# Patient Record
Sex: Male | Born: 1965 | Race: White | Hispanic: No | Smoking: Current every day smoker
Health system: Southern US, Community
[De-identification: ages and names within clinical notes are randomized; demographics above are authoritative.]

## PROBLEM LIST (undated history)

## (undated) DIAGNOSIS — T81321D Disruption or dehiscence of closure of internal operation (surgical) wound of abdominal wall muscle or fascia, subsequent encounter: Secondary | ICD-10-CM

## (undated) DIAGNOSIS — I9789 Other postprocedural complications and disorders of the circulatory system, not elsewhere classified: Secondary | ICD-10-CM

## (undated) DIAGNOSIS — E876 Hypokalemia: Secondary | ICD-10-CM

## (undated) DIAGNOSIS — E0590 Thyrotoxicosis, unspecified without thyrotoxic crisis or storm: Secondary | ICD-10-CM

## (undated) DIAGNOSIS — J9584 Transfusion-related acute lung injury (TRALI): Secondary | ICD-10-CM

## (undated) DIAGNOSIS — Z8719 Personal history of other diseases of the digestive system: Secondary | ICD-10-CM

## (undated) DIAGNOSIS — K4090 Unilateral inguinal hernia, without obstruction or gangrene, not specified as recurrent: Secondary | ICD-10-CM

## (undated) DIAGNOSIS — I700 Atherosclerosis of aorta: Secondary | ICD-10-CM

## (undated) DIAGNOSIS — Z87898 Personal history of other specified conditions: Secondary | ICD-10-CM

## (undated) DIAGNOSIS — M1990 Unspecified osteoarthritis, unspecified site: Secondary | ICD-10-CM

## (undated) DIAGNOSIS — K567 Ileus, unspecified: Secondary | ICD-10-CM

## (undated) DIAGNOSIS — R251 Tremor, unspecified: Secondary | ICD-10-CM

## (undated) DIAGNOSIS — F32A Depression, unspecified: Secondary | ICD-10-CM

## (undated) DIAGNOSIS — J189 Pneumonia, unspecified organism: Secondary | ICD-10-CM

## (undated) DIAGNOSIS — T8051XA Anaphylactic reaction due to administration of blood and blood products, initial encounter: Secondary | ICD-10-CM

## (undated) DIAGNOSIS — L0390 Cellulitis, unspecified: Secondary | ICD-10-CM

## (undated) DIAGNOSIS — R519 Headache, unspecified: Secondary | ICD-10-CM

## (undated) DIAGNOSIS — E871 Hypo-osmolality and hyponatremia: Secondary | ICD-10-CM

## (undated) DIAGNOSIS — B019 Varicella without complication: Secondary | ICD-10-CM

## (undated) DIAGNOSIS — Z862 Personal history of diseases of the blood and blood-forming organs and certain disorders involving the immune mechanism: Secondary | ICD-10-CM

## (undated) DIAGNOSIS — R51 Headache: Secondary | ICD-10-CM

## (undated) DIAGNOSIS — J40 Bronchitis, not specified as acute or chronic: Secondary | ICD-10-CM

## (undated) DIAGNOSIS — I82401 Acute embolism and thrombosis of unspecified deep veins of right lower extremity: Secondary | ICD-10-CM

## (undated) DIAGNOSIS — J45909 Unspecified asthma, uncomplicated: Secondary | ICD-10-CM

## (undated) DIAGNOSIS — K219 Gastro-esophageal reflux disease without esophagitis: Secondary | ICD-10-CM

## (undated) DIAGNOSIS — F329 Major depressive disorder, single episode, unspecified: Secondary | ICD-10-CM

## (undated) DIAGNOSIS — I10 Essential (primary) hypertension: Secondary | ICD-10-CM

## (undated) DIAGNOSIS — I509 Heart failure, unspecified: Secondary | ICD-10-CM

## (undated) DIAGNOSIS — E559 Vitamin D deficiency, unspecified: Secondary | ICD-10-CM

## (undated) DIAGNOSIS — K760 Fatty (change of) liver, not elsewhere classified: Secondary | ICD-10-CM

## (undated) DIAGNOSIS — R0602 Shortness of breath: Secondary | ICD-10-CM

## (undated) DIAGNOSIS — R600 Localized edema: Secondary | ICD-10-CM

## (undated) DIAGNOSIS — G4733 Obstructive sleep apnea (adult) (pediatric): Secondary | ICD-10-CM

## (undated) DIAGNOSIS — G43909 Migraine, unspecified, not intractable, without status migrainosus: Secondary | ICD-10-CM

## (undated) HISTORY — DX: Depression, unspecified: F32.A

## (undated) HISTORY — DX: Headache: R51

## (undated) HISTORY — DX: Unilateral inguinal hernia, without obstruction or gangrene, not specified as recurrent: K40.90

## (undated) HISTORY — DX: Unspecified asthma, uncomplicated: J45.909

## (undated) HISTORY — DX: Tremor, unspecified: R25.1

## (undated) HISTORY — DX: Hypo-osmolality and hyponatremia: E87.1

## (undated) HISTORY — DX: Acute embolism and thrombosis of unspecified deep veins of right lower extremity: I82.401

## (undated) HISTORY — DX: Vitamin D deficiency, unspecified: E55.9

## (undated) HISTORY — DX: Atherosclerosis of aorta: I70.0

## (undated) HISTORY — DX: Morbid (severe) obesity due to excess calories: E66.01

## (undated) HISTORY — DX: Ileus, unspecified: K56.7

## (undated) HISTORY — DX: Pneumonia, unspecified organism: J18.9

## (undated) HISTORY — DX: Thyrotoxicosis, unspecified without thyrotoxic crisis or storm: E05.90

## (undated) HISTORY — DX: Major depressive disorder, single episode, unspecified: F32.9

## (undated) HISTORY — DX: Fatty (change of) liver, not elsewhere classified: K76.0

## (undated) HISTORY — DX: Anaphylactic reaction due to administration of blood and blood products, initial encounter: T80.51XA

## (undated) HISTORY — DX: Varicella without complication: B01.9

## (undated) HISTORY — DX: Personal history of other diseases of the digestive system: Z87.19

## (undated) HISTORY — DX: Hypokalemia: E87.6

## (undated) HISTORY — DX: Heart failure, unspecified: I50.9

## (undated) HISTORY — DX: Transfusion-related acute lung injury (trali): J95.84

## (undated) HISTORY — DX: Other postprocedural complications and disorders of the circulatory system, not elsewhere classified: I97.89

## (undated) HISTORY — DX: Personal history of diseases of the blood and blood-forming organs and certain disorders involving the immune mechanism: Z86.2

## (undated) HISTORY — DX: Localized edema: R60.0

## (undated) HISTORY — DX: Obstructive sleep apnea (adult) (pediatric): G47.33

## (undated) HISTORY — DX: Essential (primary) hypertension: I10

## (undated) HISTORY — DX: Shortness of breath: R06.02

## (undated) HISTORY — DX: Personal history of other specified conditions: Z87.898

## (undated) HISTORY — DX: Unspecified osteoarthritis, unspecified site: M19.90

## (undated) HISTORY — DX: Cellulitis, unspecified: L03.90

## (undated) HISTORY — DX: Headache, unspecified: R51.9

## (undated) HISTORY — PX: HERNIA REPAIR: SHX51

## (undated) HISTORY — DX: Gastro-esophageal reflux disease without esophagitis: K21.9

## (undated) HISTORY — DX: Disruption or dehiscence of closure of internal operation (surgical) wound of abdominal wall muscle or fascia, subsequent encounter: T81.321D

## (undated) HISTORY — DX: Migraine, unspecified, not intractable, without status migrainosus: G43.909

---
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
## 1990-04-27 HISTORY — PX: KNEE ARTHROSCOPY: SUR90

## 1992-04-27 HISTORY — PX: OTHER SURGICAL HISTORY: SHX169

## 2011-04-28 HISTORY — PX: UMBILICAL HERNIA REPAIR: SHX2598

## 2012-04-30 ENCOUNTER — Inpatient Hospital Stay: Payer: Self-pay | Admitting: Surgery

## 2012-04-30 LAB — URINALYSIS, COMPLETE
Leukocyte Esterase: NEGATIVE
Nitrite: NEGATIVE
RBC,UR: 7 /HPF (ref 0–5)
Specific Gravity: 1.028 (ref 1.003–1.030)
WBC UR: 3 /HPF (ref 0–5)

## 2012-04-30 LAB — COMPREHENSIVE METABOLIC PANEL
Albumin: 3.5 g/dL (ref 3.4–5.0)
Anion Gap: 7 (ref 7–16)
BUN: 11 mg/dL (ref 7–18)
Creatinine: 0.97 mg/dL (ref 0.60–1.30)
Glucose: 99 mg/dL (ref 65–99)
Osmolality: 275 (ref 275–301)
Potassium: 3.9 mmol/L (ref 3.5–5.1)
SGPT (ALT): 35 U/L (ref 12–78)
Total Protein: 7.6 g/dL (ref 6.4–8.2)

## 2012-04-30 LAB — CBC
HCT: 51.3 % (ref 40.0–52.0)
HGB: 17.9 g/dL (ref 13.0–18.0)
MCH: 32.3 pg (ref 26.0–34.0)
MCHC: 34.8 g/dL (ref 32.0–36.0)
RBC: 5.53 10*6/uL (ref 4.40–5.90)
RDW: 13.1 % (ref 11.5–14.5)

## 2012-05-03 LAB — BASIC METABOLIC PANEL
Anion Gap: 5 — ABNORMAL LOW (ref 7–16)
Chloride: 104 mmol/L (ref 98–107)
Creatinine: 1.05 mg/dL (ref 0.60–1.30)
EGFR (Non-African Amer.): >60
Glucose: 110 mg/dL — ABNORMAL HIGH (ref 65–99)
Osmolality: 272 (ref 275–301)
Potassium: 4.6 mmol/L (ref 3.5–5.1)
Sodium: 137 mmol/L (ref 136–145)

## 2012-05-03 LAB — PATHOLOGY REPORT

## 2012-05-03 LAB — CBC WITH DIFFERENTIAL/PLATELET
Basophil %: 0.2 %
Eosinophil #: 0.1 10*3/uL (ref 0.0–0.7)
Eosinophil %: 1.4 %
Lymphocyte %: 17.4 %
MCHC: 33.4 g/dL (ref 32.0–36.0)
Monocyte %: 9.1 %
Neutrophil %: 71.9 %
RDW: 12.7 % (ref 11.5–14.5)
WBC: 9.7 10*3/uL (ref 3.8–10.6)

## 2013-12-20 ENCOUNTER — Emergency Department: Payer: Self-pay | Admitting: Emergency Medicine

## 2013-12-20 LAB — COMPREHENSIVE METABOLIC PANEL
ANION GAP: 7 (ref 7–16)
Albumin: 3.3 g/dL — ABNORMAL LOW (ref 3.4–5.0)
Alkaline Phosphatase: 73 U/L
BUN: 11 mg/dL (ref 7–18)
Bilirubin,Total: 0.7 mg/dL (ref 0.2–1.0)
CO2: 25 mmol/L (ref 21–32)
Calcium, Total: 8.7 mg/dL (ref 8.5–10.1)
Chloride: 108 mmol/L — ABNORMAL HIGH (ref 98–107)
Creatinine: 1.29 mg/dL (ref 0.60–1.30)
EGFR (Non-African Amer.): >60
GLUCOSE: 97 mg/dL (ref 65–99)
Osmolality: 279 (ref 275–301)
POTASSIUM: 4.2 mmol/L (ref 3.5–5.1)
SGOT(AST): 35 U/L (ref 15–37)
SGPT (ALT): 31 U/L
Sodium: 140 mmol/L (ref 136–145)
TOTAL PROTEIN: 7.3 g/dL (ref 6.4–8.2)

## 2013-12-20 LAB — CBC
HCT: 50.1 % (ref 40.0–52.0)
HGB: 16.6 g/dL (ref 13.0–18.0)
MCH: 32 pg (ref 26.0–34.0)
MCHC: 33.1 g/dL (ref 32.0–36.0)
MCV: 97 fL (ref 80–100)
Platelet: 185 10*3/uL (ref 150–440)
RBC: 5.19 10*6/uL (ref 4.40–5.90)
RDW: 13.3 % (ref 11.5–14.5)
WBC: 6.3 10*3/uL (ref 3.8–10.6)

## 2013-12-20 LAB — TROPONIN I
Troponin-I: <0.02 ng/mL
Troponin-I: <0.02 ng/mL

## 2013-12-20 LAB — CK TOTAL AND CKMB (NOT AT ARMC)
CK, Total: 168 U/L
CK-MB: 1.2 ng/mL (ref 0.5–3.6)

## 2014-08-17 NOTE — Discharge Summary (Signed)
PATIENT NAME:  [name], [name] MR#:  [id number] DATE OF BIRTH:  Jun 07, 1965  DATE OF ADMISSION:  04/30/2012 DATE OF DISCHARGE:  05/05/2012  DATE OF DICTATION:  05/19/2012  FINAL DIAGNOSES: 1.  Obesity.  2.  Incarcerated ventral hernia.  HOSPITAL COURSE SUMMARY:  The patient was admitted following a complex repair of a ventral hernia on the 4th of January.  A Jackson-Pratt drain was left in place. The patient did well postoperatively. Pain control was a big issue. He was given a PCA morphine pump which seemed to control his pain. He refused the nicotine patch.  On postoperative day #3, the patient's JP was removed. Wound was healing nicely.  On postoperative day #4, had one episode of emesis, but diarrhea stools. His diet was able to be advanced and he was subsequently discharged home on the 9th of January in stable condition.   DISCHARGE MEDICATIONS:  Acetaminophen oxycodone 5/325 one tab every 4 hours as needed for pain.  Follow up with me in the office in 1 to 2 weeks.   FINAL DIAGNOSES: 1.  Incarcerated ventral hernia.  2.  Obesity.    ____________________________ Jeannette How Marina Gravel, MD mab:ce D: 05/19/2012 15:24:33 ET T: 05/19/2012 17:37:08 ET JOB#: [id number]  cc: Elta Guadeloupe A. Marina Gravel, MD, <Dictator> Sher Shampine A Jamaya Sleeth MD ELECTRONICALLY SIGNED 05/19/2012 21:27

## 2014-08-17 NOTE — H&P (Signed)
PATIENT NAME:  [name], [name] MR#:  [id number] DATE OF BIRTH:  Mar 19, 1966  DATE OF ADMISSION:  04/30/2012  ADMITTING DIAGNOSIS: Incarcerated ventral hernia.   HISTORY: The patient is a 49 year old white male with morbid obesity and a 10-history of a slowly enlarging ventral hernia which has always been reducible. He presents to the Emergency Room with 4 days history of abdominal pain and skin changes around the hernia. He has had one day of not being able to push the hernia back in followed nausea, vomiting, and some loose stools. He has had some low-grade fevers. He presents to the Emergency Room with the above complaints. Attempt in the Emergency Room at reduction were unsuccessful and as such surgical services were contacted. He has had no previous repairs and no previous abdominal operations.   ALLERGIES: SULFA.   MEDICATIONS: None.   PAST MEDICAL HISTORY: Obesity.   PAST SURGICAL HISTORY: None.   SOCIAL HISTORY: He smokes approximately 1 pack of cigarettes per day. Drinks 5 to 6 mixed drinks per week and is employed as a IT consultant.   FAMILY HISTORY: Noncontributory.   PHYSICAL EXAMINATION:  GENERAL: Pulse 68, blood pressure 133/66, 97% room air sats.  LUNGS: Clear.  HEART: Regular rate and rhythm.  ABDOMEN: Abdomen demonstrates a very large, 15 to 20 cm, ventral hernia likely arising from the umbilicus as the umbilical stock has been obliterated. There is some ecchymosis and some small amount of excoriation overlying the skin. No active infection. No cellulitis. The hernia soft but somewhat tender. I could not reduce it. The remaining abdominal examination is unremarkable except for that of obesity.   LABORATORY, DIAGNOSTIC, AND RADIOLOGICAL DATA: Electrolytes and CBC were normal. The urinalysis negative. EKG is pending.   IMPRESSION: A 49 year old obese white male with incarcerated ventral hernia.   PLAN: We will plan for urgent repair today given the recent skin changes  and change in the character of the examination, as well as nausea and vomiting. I discussed with him and his daughter extensively the risks of surgery including that of bleeding, infection, the need for possible bowel resection, and risk of recurrence. All of his questions were answered. He was encouraged to stop smoking immediately.   TOTAL TIME SPENT: 30 minutes.  ____________________________ Jeannette How Marina Gravel, MD mab:jm D: 04/30/2012 10:34:13 ET T: 04/30/2012 11:06:23 ET JOB#: [id number]  cc: Elta Guadeloupe A. Marina Gravel, MD, <Dictator> Hortencia Conradi MD ELECTRONICALLY SIGNED 05/02/2012 7:49

## 2014-08-17 NOTE — Op Note (Signed)
PATIENT NAME:  [name], [name] MR#:  [id number] DATE OF BIRTH:  09/04/1965  DATE OF PROCEDURE:  04/30/2012  PREOPERATIVE DIAGNOSIS: Incarcerated ventral hernia.   POSTOPERATIVE DIAGNOSIS: Incarcerated ventral hernia.   PROCEDURE PERFORMED: Repair of incarcerated ventral hernia with 11 cm x 14 cm Ventrio Patch and complex skin closure.   SURGEON: Sherri Rad, MD.   ASSISTANT: None.   ANESTHESIA: General endotracheal.   FINDINGS: Incarcerated omentum. There was a large amount of small bowel, all of which was viable, easily reduced back into the abdomen.   SPECIMEN: Hernia sac.   ESTIMATED BLOOD LOSS: 125 mL.   DESCRIPTION OF PROCEDURE: With the patient in the supine position, general endotracheal anesthesia was induced. Foley catheter was placed under sterile technique. The patient's abdomen was clipped of hair, sterilely prepped and draped with ChloraPrep solution followed by Ioban drape.   Timeout was observed. Skin incision was fashioned from above the umbilicus with a scalpel, carried directly over into the hernia sac. The hernia sac was then separated very carefully from the underlying dermis with sharp dissection. The area of excoriation of the skin was excised and discarded.    Dissection was then undertaken to surround the hernia sac and separating it from the subcutaneous attachments. The hernia sac was then opened. There was no cloudy fluid. Bowel was found to be easily reduced back into the abdomen. No signs of bowel ischemia. Omentum was also found within the hernia contents and attached at several points to the hernia sac, taken down with electrocautery. Hemostasis was further obtained in omentum utilizing clamps and ties of #0 Vicryl suture. With the hernia sac reduced, the hernia sac was then excised with electrocautery and submitted to pathology as specimen. A preperitoneal pocket was then fashioned circumferentially to the undersurface of the fascia with blunt technique. Hernia  sac edges were further resected back to the edge of the fascia. Subcutaneous tissues above the fascia were taken down circumferentially for placement of sutures. Hernia defect measured approximately 4 x 5 cm in size. An 11 cm x 14 cm ventrio dual mesh patch was brought into the field; and after closing the peritoneal defect, the hernia mesh was then inserted in a vertical orientation and secured at eight points with interrupted mattress-type sutures of #0 Vicryl. Fascial edges were then reapproximated easily in the midline with interrupted #0 Vicryl suture. Small bites of the anterior leaflet were taken to obliterate any space between the undersurface of the fascia and the mesh. Subcutaneous tissue was then irrigated. Skin was then excised of its  excess with scalpel. This allowed a vertical closure.   Multiple layers were then closed to obliterate the dead space with 2-0 Vicryl suture. Subcutaneous drain was placed in the majority of the hernia sac cavity exiting the right lower abdomen. Deep dermal interrupted 2-0 Vicryl sutures were placed followed by 4-0 interrupted nylon. Skin stapler was used to reapproximate skin edges. Drain exit site was secured with nylon suture. Sterile occlusive dressing was placed. Foley catheter was removed. The patient was extubated and taken to the recovery room in stable and satisfactory condition by anesthesia services.    ____________________________ Jeannette How Marina Gravel, MD mab:th D: 05/01/2012 13:40:14 ET T: 05/01/2012 20:33:25 ET JOB#: [id number]  cc: Elta Guadeloupe A. Marina Gravel, MD, <Dictator> Hortencia Conradi MD ELECTRONICALLY SIGNED 05/02/2012 6:52

## 2015-08-19 IMAGING — CR DG CHEST 1V PORT
1 series · 1 of 1 positions shown · non-contrast
Comparison: None.

CLINICAL DATA: Chest pain and difficulty breathing

EXAM:
PORTABLE CHEST - 1 VIEW

[ap]
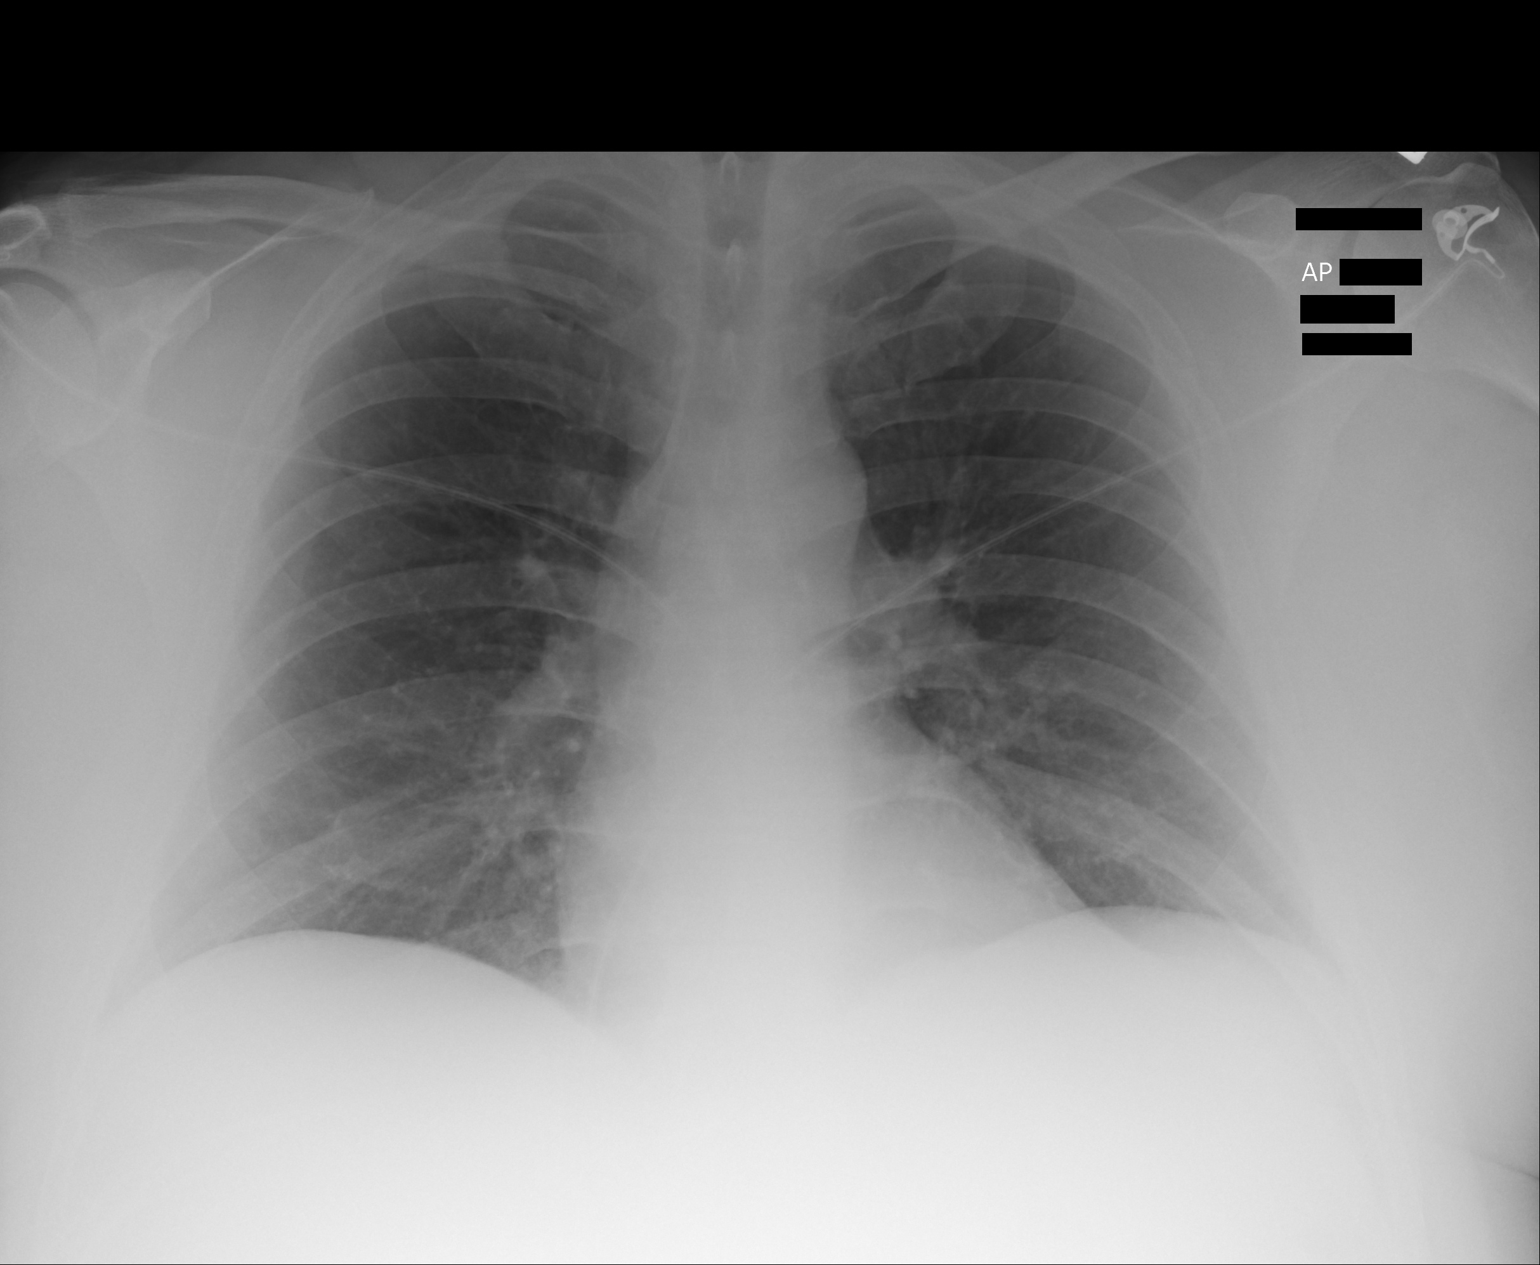

[1 of 1 positions shown; findings below may reference images not displayed]

FINDINGS: Lungs are clear. The heart size and pulmonary vascularity are
normal. No adenopathy. No pneumothorax. No bone lesions.
IMPRESSION: No abnormality noted.

## 2016-02-05 ENCOUNTER — Ambulatory Visit (INDEPENDENT_AMBULATORY_CARE_PROVIDER_SITE_OTHER): Payer: BLUE CROSS/BLUE SHIELD | Admitting: Primary Care

## 2016-02-05 ENCOUNTER — Encounter (INDEPENDENT_AMBULATORY_CARE_PROVIDER_SITE_OTHER): Payer: Self-pay

## 2016-02-05 ENCOUNTER — Encounter: Payer: Self-pay | Admitting: Primary Care

## 2016-02-05 VITALS — BP 126/80 | HR 70 | Temp 97.9°F | Ht 72.0 in | Wt 357.1 lb

## 2016-02-05 DIAGNOSIS — Z720 Tobacco use: Secondary | ICD-10-CM | POA: Diagnosis not present

## 2016-02-05 DIAGNOSIS — R41840 Attention and concentration deficit: Secondary | ICD-10-CM

## 2016-02-05 MED ORDER — BUPROPION HCL ER (SMOKING DET) 150 MG PO TB12: 150.0000 mg | ORAL_TABLET | Freq: Two times a day (BID) | ORAL | 1 refills | Status: DC

## 2016-02-05 NOTE — Assessment & Plan Note (Signed)
Smoker of 40 years and currently smoking 1/2-3/4 PPD. Once successfully quit on Zyban, would like to try again. Discussed directions for starting including choosing a quit date, potential side effects, etc.   Rx for Zyban sent to pharmacy. He will start 1 tablet once daily x 3 days, then advance to twice daily dosing thereafter.

## 2016-02-05 NOTE — Patient Instructions (Addendum)
Start Zyban tablets for smoking cessation. Take 1 tablet by mouth once daily for 3 days, then advance to 1 tablet twice daily thereafter.  You must choose a quit date within two weeks of initiation of medication.  You will be contacted regarding your referral for ADHD testing.  Please let us know if you have not heard back within one week.   Please schedule a physical with me in the near future at your convenience. You may also schedule a lab only appointment 3-4 days prior. We will discuss your lab results in detail during your physical.  It was a pleasure to meet you today! Please don't hesitate to call me with any questions. Welcome to [facility]!

## 2016-02-05 NOTE — Assessment & Plan Note (Signed)
Diagnosed with ADD in middle school, once on Ritalin. No formal testing since. Symptoms present for the past 2-3 weeks.  Will send him for formal testing through behavorial health. Will await test results.

## 2016-02-05 NOTE — Progress Notes (Signed)
Subjective:    Patient ID: [name], male    DOB: [birth year], 50 y.o.   MRN: [id number]  HPI  [name] is a 50 year old male who presents today to establish care and discuss the problems mentioned below. Will obtain old records. His last physical was 3 years ago.   1) ADHD: History of diagnosed ADD with symptoms of difficulty focusing since middle school.  He was previously managed on Ritalin but stopped taking as he could not take this while in the [employer] during his younger years.   He's started noticing symptoms resurface during the past 2-3 weeks including difficulty focusing, starting multiple tasks without completion, and frustration. He will feel frustrated when he loses focus and will end up going to sleep. Since these symptoms have returned he has increased his intake of coffee and is taking a caffeine tablet daily. He was last on medication in 1994 for ADD and is interested in treatment. He's had no formal testing since middle school.  2) Tobacco Abuse: Smoker of cigarettes for the past 40 years. He currently smokes 1/2-3/4 pack per day. He is motivated to quit. He's tried Zyban in the past with improvement and was able to quit. He had a bad experience on Wellbutrin in the past. He's failed treatment on nicotine patches and gum. He is interested in restarting Zyban given the success he had in the past.  3) Asthma: Diagnosed in childhood, more problematic during middle school and high school years. He does have a history of sleep apnea and sleeps with a CPAP machine. He's not used an albuterol inhaler in years. Denies wheezing, shortness of breath.  Review of Systems  Constitutional: Negative for unexpected weight change.  HENT:       Denies hemoptysis  Respiratory: Negative for cough, shortness of breath and wheezing.   Cardiovascular: Negative for chest pain.  Allergic/Immunologic: Positive for environmental allergies.  Psychiatric/Behavioral: Positive for decreased  concentration. Negative for sleep disturbance. The patient is nervous/anxious.        Past Medical History:  Diagnosis Date  . Arthritis   . Asthma   . Chickenpox   . Depression   . Frequent headaches   . GERD (gastroesophageal reflux disease)   . Hypertension   . Migraines   . OSA (obstructive sleep apnea)      Social History   Social History  . Marital status: Single    Spouse name: N/A  . Number of children: N/A  . Years of education: N/A   Occupational History  . Not on file.   Social History Main Topics  . Smoking status: Current Every Day Smoker    Packs/day: 0.50    Types: Cigarettes  . Smokeless tobacco: Not on file  . Alcohol use Yes  . Drug use: Unknown  . Sexual activity: Not on file   Other Topics Concern  . Not on file   Social History Narrative   Single.   2 children.   Works independently as Hydrologist.   Enjoys reading, watching tv.    Past Surgical History:  Procedure Laterality Date  . APPENDECTOMY  1977  . knee    . UMBILICAL HERNIA REPAIR  2013    Family History  Problem Relation Age of Onset  . Ovarian cancer Mother   . Alcohol abuse Father   . Hyperlipidemia Father   . Hypertension Father   . Arthritis Maternal Grandmother   . Colon cancer Maternal Grandmother   . Ovarian  cancer Maternal Grandmother   . Breast cancer Maternal Grandmother   . Hyperlipidemia Maternal Grandfather   . Hypertension Maternal Grandfather   . Hyperlipidemia Paternal Grandmother   . Kidney disease Paternal Grandmother   . Hyperlipidemia Paternal Grandfather   . Hypertension Paternal Grandfather     Allergies  Allergen Reactions  . Sulfa Antibiotics Anaphylaxis and Hives    No current outpatient prescriptions on file prior to visit.   No current facility-administered medications on file prior to visit.     BP 126/80   Pulse 70   Temp 97.9 F (36.6 C) (Oral)   Ht 6' (1.829 m)   Wt (!) 357 lb 1.9 oz (162 kg)   SpO2 96%   BMI 48.43 kg/m     Objective:   Physical Exam  Constitutional: He is oriented to person, place, and time. He appears well-nourished.  Neck: Neck supple.  Cardiovascular: Normal rate and regular rhythm.   Pulmonary/Chest: Effort normal and breath sounds normal. He has no wheezes. He has no rales.  Neurological: He is alert and oriented to person, place, and time.  Skin: Skin is warm and dry.  Psychiatric: He has a normal mood and affect.          Assessment & Plan:

## 2016-02-05 NOTE — Progress Notes (Signed)
Pre visit review using our clinic review tool, if applicable. No additional management support is needed unless otherwise documented below in the visit note. 

## 2016-03-12 ENCOUNTER — Telehealth: Payer: Self-pay

## 2016-03-12 NOTE — Telephone Encounter (Signed)
Patient calls to report that he is taking the Zyban as prescribed; however, this past Friday when he increased from 1 pill daily to 1 pill bid, he started experiencing significant dizziness, nausea, and increased emotional outbursts.    Please advise as to what patient should do.

## 2016-03-13 NOTE — Telephone Encounter (Signed)
How did he feel on the 1 tablet daily? Stop taking this medication twice daily. Has he noticed any improvement thus far?

## 2016-03-13 NOTE — Telephone Encounter (Signed)
Spoken to patient and notified of Kate's comments. Patient feels okay but even more emotional though. He feels a little improvement.

## 2016-03-14 NOTE — Telephone Encounter (Signed)
Noted. If he's experiencing symptoms of dizziness, nausea, and increased emotional outbursts on 1 tablet then I recommend he stop use of this medication. If no problem on 1 tablet once daily, then okay to continue.

## 2016-03-17 NOTE — Telephone Encounter (Signed)
Spoken and notified patient of Kate's comments. Patient verbalized understanding. 

## 2016-03-27 DIAGNOSIS — J40 Bronchitis, not specified as acute or chronic: Secondary | ICD-10-CM

## 2016-03-27 HISTORY — DX: Bronchitis, not specified as acute or chronic: J40

## 2016-04-05 ENCOUNTER — Other Ambulatory Visit: Payer: Self-pay | Admitting: Primary Care

## 2016-04-05 DIAGNOSIS — Z0000 Encounter for general adult medical examination without abnormal findings: Secondary | ICD-10-CM

## 2016-04-08 ENCOUNTER — Other Ambulatory Visit (INDEPENDENT_AMBULATORY_CARE_PROVIDER_SITE_OTHER): Payer: BLUE CROSS/BLUE SHIELD

## 2016-04-08 DIAGNOSIS — Z0000 Encounter for general adult medical examination without abnormal findings: Secondary | ICD-10-CM

## 2016-04-08 LAB — COMPREHENSIVE METABOLIC PANEL
ALBUMIN: 3.9 g/dL (ref 3.5–5.2)
ALT: 29 U/L (ref 0–53)
AST: 26 U/L (ref 0–37)
Alkaline Phosphatase: 68 U/L (ref 39–117)
BUN: 11 mg/dL (ref 6–23)
CHLORIDE: 101 meq/L (ref 96–112)
CO2: 33 mEq/L — ABNORMAL HIGH (ref 19–32)
Calcium: 9.2 mg/dL (ref 8.4–10.5)
Creatinine, Ser: 1.15 mg/dL (ref 0.40–1.50)
GFR: 72.06 mL/min (ref >60.00)
Glucose, Bld: 107 mg/dL — ABNORMAL HIGH (ref 70–99)
POTASSIUM: 4.1 meq/L (ref 3.5–5.1)
Sodium: 139 mEq/L (ref 135–145)
Total Bilirubin: 0.5 mg/dL (ref 0.2–1.2)
Total Protein: 7.2 g/dL (ref 6.0–8.3)

## 2016-04-08 LAB — LIPID PANEL
CHOLESTEROL: 130 mg/dL (ref 0–200)
HDL: 43.7 mg/dL (ref >39.00)
LDL CALC: 71 mg/dL (ref 0–99)
NonHDL: 86.44
TRIGLYCERIDES: 79 mg/dL (ref 0.0–149.0)
Total CHOL/HDL Ratio: 3
VLDL: 15.8 mg/dL (ref 0.0–40.0)

## 2016-04-08 LAB — HEMOGLOBIN A1C: Hgb A1c MFr Bld: 5.4 % (ref 4.6–6.5)

## 2016-04-09 ENCOUNTER — Ambulatory Visit (INDEPENDENT_AMBULATORY_CARE_PROVIDER_SITE_OTHER): Payer: BLUE CROSS/BLUE SHIELD | Admitting: Internal Medicine

## 2016-04-09 ENCOUNTER — Encounter: Payer: Self-pay | Admitting: Internal Medicine

## 2016-04-09 ENCOUNTER — Other Ambulatory Visit: Payer: Self-pay | Admitting: Primary Care

## 2016-04-09 ENCOUNTER — Telehealth: Payer: Self-pay | Admitting: Internal Medicine

## 2016-04-09 VITALS — BP 128/84 | HR 77 | Temp 98.3°F | Wt 343.0 lb

## 2016-04-09 DIAGNOSIS — J209 Acute bronchitis, unspecified: Secondary | ICD-10-CM | POA: Diagnosis not present

## 2016-04-09 DIAGNOSIS — Z720 Tobacco use: Secondary | ICD-10-CM

## 2016-04-09 MED ORDER — ALBUTEROL SULFATE HFA 108 (90 BASE) MCG/ACT IN AERS: 2.0000 | INHALATION_SPRAY | Freq: Four times a day (QID) | RESPIRATORY_TRACT | 0 refills | Status: DC | PRN

## 2016-04-09 MED ORDER — HYDROCODONE-HOMATROPINE 5-1.5 MG/5ML PO SYRP: 5.0000 mL | ORAL_SOLUTION | Freq: Three times a day (TID) | ORAL | 0 refills | Status: DC | PRN

## 2016-04-09 MED ORDER — AZITHROMYCIN 250 MG PO TABS: ORAL_TABLET | ORAL | 0 refills | Status: DC

## 2016-04-09 MED ORDER — PREDNISONE 10 MG PO TABS
ORAL_TABLET | ORAL | 0 refills | Status: DC
Start: 2016-04-09 — End: 2016-05-25

## 2016-04-09 NOTE — Telephone Encounter (Signed)
Patient woke up with left lung pain and rattling and a cough.  Can patient be added on to [name]'s schedule at the end of the day?

## 2016-04-09 NOTE — Telephone Encounter (Signed)
Yes, 4:30

## 2016-04-09 NOTE — Patient Instructions (Signed)

## 2016-04-09 NOTE — Progress Notes (Signed)
HPI  Pt presents to the clinic today with c/o runny nose, cough and chest congestion. This started 4 days ago. [name] is blowing clear mucous out of his nose. The cough is non productive. [name] denies fever, chills, body aches or shortness of breath. [name] has tried Copywriter, advertising without any relief. She has a history of asthma but it has not bothered him as an adult. [name] has not had sick contacts that [name] is aware of.  Review of Systems        Past Medical History:  Diagnosis Date  . Arthritis   . Asthma   . Chickenpox   . Depression   . Frequent headaches   . GERD (gastroesophageal reflux disease)   . Hypertension   . Migraines   . OSA (obstructive sleep apnea)     Family History  Problem Relation Age of Onset  . Ovarian cancer Mother   . Alcohol abuse Father   . Hyperlipidemia Father   . Hypertension Father   . Arthritis Maternal Grandmother   . Colon cancer Maternal Grandmother   . Ovarian cancer Maternal Grandmother   . Breast cancer Maternal Grandmother   . Hyperlipidemia Maternal Grandfather   . Hypertension Maternal Grandfather   . Hyperlipidemia Paternal Grandmother   . Kidney disease Paternal Grandmother   . Hyperlipidemia Paternal Grandfather   . Hypertension Paternal Grandfather     Social History   Social History  . Marital status: Single    Spouse name: N/A  . Number of children: N/A  . Years of education: N/A   Occupational History  . Not on file.   Social History Main Topics  . Smoking status: Current Every Day Smoker    Packs/day: 0.50    Types: Cigarettes  . Smokeless tobacco: Not on file  . Alcohol use Yes  . Drug use: Unknown  . Sexual activity: Not on file   Other Topics Concern  . Not on file   Social History Narrative   Single.   2 children.   Works independently as Hydrologist.   Enjoys reading, watching tv.    Allergies  Allergen Reactions  . Sulfa Antibiotics Anaphylaxis and Hives     Constitutional: Denies headache, fatigue, fever or  abrupt weight changes.  HEENT:  Positive runny nose. Denies eye redness, eye pain, pressure behind the eyes, facial pain, nasal congestion, ear pain, ringing in the ears, wax buildup, or bloody nose. Respiratory: Positive cough. Denies difficulty breathing or shortness of breath.  Cardiovascular: Denies chest pain, chest tightness, palpitations or swelling in the hands or feet.   No other specific complaints in a complete review of systems (except as listed in HPI above).  Objective:   BP 128/84   Pulse 77   Temp 98.3 F (36.8 C) (Oral)   Wt (!) 343 lb (155.6 kg)   SpO2 97%   BMI 46.52 kg/m  Wt Readings from Last 3 Encounters:  04/09/16 (!) 343 lb (155.6 kg)  02/05/16 (!) 357 lb 1.9 oz (162 kg)     General: Appears his stated age, obesein NAD. HEENT: Head: normal shape and size; Ears: Tm's gray and intact, normal light reflex; Nose: mucosa pink and moist, septum midline; Throat/Mouth: Teeth present, mucosa erythematous and moist, no exudate noted, no lesions or ulcerations noted.  Neck: No cervical lymphadenopathy.  Cardiovascular: Normal rate and rhythm. S1,S2 noted.  No murmur, rubs or gallops noted.  Pulmonary/Chest: Normal effort and scattered rhonchi with bilateral expiratory wheezing noted. No respiratory distress.  No rales noted.       Assessment & Plan:   Acute Bronchitis:  Get some rest and drink plenty of water eRx for Azithromax x 5 days eRx for Pred Taper eRx for Albuterol inhaler Rx for Hycodan cough syrup  RTC as needed or if symptoms persist.   Webb Silversmith, NP

## 2016-04-10 ENCOUNTER — Encounter: Payer: Self-pay | Admitting: Internal Medicine

## 2016-04-13 ENCOUNTER — Encounter: Payer: Self-pay | Admitting: Primary Care

## 2016-04-13 ENCOUNTER — Ambulatory Visit (INDEPENDENT_AMBULATORY_CARE_PROVIDER_SITE_OTHER): Payer: BLUE CROSS/BLUE SHIELD | Admitting: Primary Care

## 2016-04-13 ENCOUNTER — Ambulatory Visit (INDEPENDENT_AMBULATORY_CARE_PROVIDER_SITE_OTHER)
Admission: RE | Admit: 2016-04-13 | Discharge: 2016-04-13 | Disposition: A | Discharge status: Home / Self Care | Payer: BLUE CROSS/BLUE SHIELD | Source: Ambulatory Visit | Attending: Primary Care | Admitting: Primary Care

## 2016-04-13 VITALS — BP 124/84 | HR 68 | Temp 97.8°F | Ht 72.0 in | Wt 350.8 lb

## 2016-04-13 DIAGNOSIS — Z0001 Encounter for general adult medical examination with abnormal findings: Secondary | ICD-10-CM | POA: Diagnosis not present

## 2016-04-13 DIAGNOSIS — Z720 Tobacco use: Secondary | ICD-10-CM

## 2016-04-13 DIAGNOSIS — R05 Cough: Secondary | ICD-10-CM

## 2016-04-13 DIAGNOSIS — Z3009 Encounter for other general counseling and advice on contraception: Secondary | ICD-10-CM

## 2016-04-13 DIAGNOSIS — Z0000 Encounter for general adult medical examination without abnormal findings: Secondary | ICD-10-CM

## 2016-04-13 DIAGNOSIS — R059 Cough, unspecified: Secondary | ICD-10-CM

## 2016-04-13 MED ORDER — HYDROCODONE-HOMATROPINE 5-1.5 MG/5ML PO SYRP: 5.0000 mL | ORAL_SOLUTION | Freq: Three times a day (TID) | ORAL | 0 refills | Status: DC | PRN

## 2016-04-13 NOTE — Progress Notes (Signed)
Subjective:    Patient ID: [name], male    DOB: 19-Mar-1966, 50 y.o.   MRN: [id number]  HPI  [name] is a 50 year old male who presents today for complete physical and a chief complaint of cough. He was evaluated on 04/09/16 with a chief complaint of URI symptoms. He was diagnosed and treated for acute bronchitis with Azithromycin, Prednisone, Hycodan, and Albuterol. Since that visit he's noticed some improvement except for the cough. He doesn't feel sick. He does have 2 days left of his Azithromycin and Prednisone. He's noticed improvement in shortness of breath with his albuterol inhaler.  Immunizations: -Tetanus: Completed in 2013 -Influenza: Declines   Diet: He endorses a fair diet. Breakfast: Egg, toast, avocado  Lunch: Sandwich, wrap Dinner: Pasta, meat, vegetables Snacks: Occasionally. Veggies. Desserts: None Beverages: Coffee with sugar, unsweet tea, water.  Exercise: He does not currently exercise. Eye exam: Completed 10 years ago. He denies changes in vision. Dental exam: Completed several years ago.    Review of Systems  Constitutional: Positive for fatigue. Negative for fever and unexpected weight change.  HENT: Positive for congestion. Negative for rhinorrhea, sinus pressure and sore throat.   Respiratory: Positive for cough, shortness of breath and wheezing.   Cardiovascular: Negative for chest pain.  Gastrointestinal: Negative for constipation and diarrhea.  Genitourinary: Negative for difficulty urinating.       Would like referral for vasectomy.  Musculoskeletal: Negative for arthralgias and myalgias.  Skin: Negative for rash.  Allergic/Immunologic: Negative for environmental allergies.  Neurological: Negative for dizziness, numbness and headaches.  Psychiatric/Behavioral:       He denies concerns for anxiety or depression.       Past Medical History:  Diagnosis Date  . Arthritis   . Asthma   . Chickenpox   . Depression   . Frequent  headaches   . GERD (gastroesophageal reflux disease)   . Hypertension   . Migraines   . OSA (obstructive sleep apnea)      Social History   Social History  . Marital status: Single    Spouse name: N/A  . Number of children: N/A  . Years of education: N/A   Occupational History  . Not on file.   Social History Main Topics  . Smoking status: Former Smoker    Packs/day: 0.50    Types: Cigarettes  . Smokeless tobacco: Current User  . Alcohol use Yes  . Drug use: Unknown  . Sexual activity: Not on file   Other Topics Concern  . Not on file   Social History Narrative   Single.   2 children.   Works independently as Hydrologist.   Enjoys reading, watching tv.    Past Surgical History:  Procedure Laterality Date  . APPENDECTOMY  1977  . knee    . UMBILICAL HERNIA REPAIR  2013    Family History  Problem Relation Age of Onset  . Ovarian cancer Mother   . Alcohol abuse Father   . Hyperlipidemia Father   . Hypertension Father   . Arthritis Maternal Grandmother   . Colon cancer Maternal Grandmother   . Ovarian cancer Maternal Grandmother   . Breast cancer Maternal Grandmother   . Hyperlipidemia Maternal Grandfather   . Hypertension Maternal Grandfather   . Hyperlipidemia Paternal Grandmother   . Kidney disease Paternal Grandmother   . Hyperlipidemia Paternal Grandfather   . Hypertension Paternal Grandfather     Allergies  Allergen Reactions  . Sulfa Antibiotics Anaphylaxis and  Hives    Current Outpatient Prescriptions on File Prior to Visit  Medication Sig Dispense Refill  . albuterol (PROVENTIL HFA;VENTOLIN HFA) 108 (90 Base) MCG/ACT inhaler Inhale 2 puffs into the lungs every 6 (six) hours as needed for wheezing or shortness of breath. 1 Inhaler 0  . azithromycin (ZITHROMAX) 250 MG tablet Take 2 tabs today, then 1 tab daily x 4 days 6 tablet 0  . buPROPion (WELLBUTRIN SR) 150 MG 12 hr tablet TAKE ONE TABLET BY MOUTH TWICE DAILY 60 tablet 0  . predniSONE  (DELTASONE) 10 MG tablet Take 3 tabs on days 1-2, take 2 tabs on days 3-4, take 1 tab on days 5-6 12 tablet 0   No current facility-administered medications on file prior to visit.     BP 124/84   Pulse 68   Temp 97.8 F (36.6 C) (Oral)   Ht 6' (1.829 m)   Wt (!) 350 lb 12.8 oz (159.1 kg)   SpO2 97%   BMI 47.58 kg/m    Objective:   Physical Exam  Constitutional: He is oriented to person, place, and time. He appears well-nourished. He does not appear ill.  HENT:  Right Ear: Tympanic membrane and ear canal normal.  Left Ear: Tympanic membrane and ear canal normal.  Nose: Nose normal. Right sinus exhibits no maxillary sinus tenderness and no frontal sinus tenderness. Left sinus exhibits no maxillary sinus tenderness and no frontal sinus tenderness.  Mouth/Throat: Oropharynx is clear and moist.  Eyes: Conjunctivae and EOM are normal. Pupils are equal, round, and reactive to light.  Neck: Neck supple. Carotid bruit is not present. No thyromegaly present.  Cardiovascular: Normal rate, regular rhythm and normal heart sounds.   Pulmonary/Chest: Effort normal. He has wheezes in the right upper field, the right lower field, the left upper field and the left lower field. He has rhonchi in the right upper field, the right lower field, the left upper field and the left lower field. He has no rales.  Abdominal: Soft. Bowel sounds are normal. There is no tenderness. A hernia is present.  Musculoskeletal: Normal range of motion.  Neurological: He is alert and oriented to person, place, and time. He has normal reflexes. No cranial nerve deficit.  Skin: Skin is warm and dry.  Psychiatric: He has a normal mood and affect.          Assessment & Plan:  Acute Bronchitis:  Cough, congestion since 04/05/16. Currently managed on Prednisone, Albuterol, and Azithromycin from last visit. Overall feeling better, except for congestion and cough. Exam with moderate rhonchi and wheezing. Stable  vitals. Obtain chest xray today to rule out pneumonia. If negative, will consider Doxycycline and Mucinex. Continue albuterol for now. Will await chest xray results.  Sheral Flow, NP

## 2016-04-13 NOTE — Patient Instructions (Addendum)
Complete xray(s) prior to leaving today. I will notify you of your results once received.  Continue taking Prednisone and Azithromycin.  Continue to work on improvements in your diet. Increase consumption of fresh vegetables and fruits, whole grains, water.  Ensure you are drinking 64 ounces of water daily.  Start exercising. You should be getting 150 minutes of moderate intensity exercise weekly.  Follow up in 1 year for repeat physical or sooner if needed.  It was a pleasure to see you today!

## 2016-04-13 NOTE — Progress Notes (Signed)
Pre visit review using our clinic review tool, if applicable. No additional management support is needed unless otherwise documented below in the visit note. 

## 2016-04-13 NOTE — Assessment & Plan Note (Signed)
Quit smoking 1 month ago, commended him on this success. Continue Zyban.

## 2016-04-13 NOTE — Assessment & Plan Note (Signed)
Tetanus UTD, declines influenza. Overall fair diet, discussed to exercise regularly. Discussed the importance of a healthy diet and regular exercise in order for weight loss, and to reduce the risk of other medical diseases. Exam with acute bronchitis, otherwise unremarkable. Labs unremarkable. Follow up in 1 year for annual exam.

## 2016-04-24 ENCOUNTER — Other Ambulatory Visit: Payer: Self-pay | Admitting: Internal Medicine

## 2016-04-24 NOTE — Telephone Encounter (Addendum)
Pt notified as instructed by verbal order from Avie Echevaria NP. Pt voiced understanding and will cb if needed.

## 2016-04-24 NOTE — Telephone Encounter (Signed)
Per verbal order from Baity---pt can take OTC Zyrtec and Flonase

## 2016-04-24 NOTE — Telephone Encounter (Signed)
Pt left v/m requesting refill status of zpak and prednisone; pt was seen on 04/13/16; pt is better but symptoms not cleared. Pt request cb. [facility].

## 2016-05-06 ENCOUNTER — Ambulatory Visit: Payer: Self-pay | Admitting: Urology

## 2016-05-07 ENCOUNTER — Encounter: Payer: Self-pay | Admitting: Primary Care

## 2016-05-07 NOTE — Telephone Encounter (Signed)
Vaughan Basta, see patient's e-mail. Not sure why this was sent to me, can you help?

## 2016-05-11 ENCOUNTER — Encounter: Payer: Self-pay | Admitting: Urology

## 2016-05-11 ENCOUNTER — Ambulatory Visit: Payer: Self-pay | Admitting: Urology

## 2016-05-11 ENCOUNTER — Ambulatory Visit (INDEPENDENT_AMBULATORY_CARE_PROVIDER_SITE_OTHER): Payer: BLUE CROSS/BLUE SHIELD | Admitting: Urology

## 2016-05-11 VITALS — BP 146/88 | HR 85 | Ht 73.0 in | Wt 355.2 lb

## 2016-05-11 DIAGNOSIS — N432 Other hydrocele: Secondary | ICD-10-CM

## 2016-05-11 DIAGNOSIS — Z3009 Encounter for other general counseling and advice on contraception: Secondary | ICD-10-CM

## 2016-05-11 MED ORDER — DIAZEPAM 10 MG PO TABS: ORAL_TABLET | ORAL | 0 refills | Status: DC

## 2016-05-11 NOTE — Progress Notes (Signed)
As consult

## 2016-05-11 NOTE — Progress Notes (Signed)
05/11/2016 9:47 AM   [name] 01/08/1966 [id number]  Referring provider: Pleas Koch, NP [address] [place], [place] [address]  Chief Complaint  Patient presents with  . VAS Consult    referred by     HPI: [name] is a 51 year old Caucasian male who presents as a referral by Pleas Koch, NP for a vasectomy.  Patient has two children, one son and one daughter, and wishes to end his family unit at this point.  Patient denies any history of chronic prostatitis, epididymitis, orchitis, or other genital pain.  Today, we discussed what the vas deferens is, where it is located, and its function. We reviewed the procedure for vasectomy, it's risks, benefits, alternatives, and likelihood of achieving his goals.   We discussed in detail the procedure, complications, and recovery as well as the need for clearance prior to unprotected intercourse. We discussed that vasectomy does not protect against sexually transmitted diseases. We discussed that this procedure does not result in immediate sterility and that they would need to use other forms of birth control until he has been cleared with a three month negative postvasectomy semen analyses.  I explained that the procedure is considered to be permanent and that attempts at reversal have varying degrees of success. These options include vasectomy reversal, sperm retrieval, and in vitro fertilization; these can be very expensive.   We discussed the chance of postvasectomy pain syndrome which occurs in less than 5% of patients. I explained to the patient that there is no treatment to resolve this chronic pain, and that if it developed I would not be able to help resolve the issue, but that surgery is generally not needed for correction.   I explained there have even been reports of systemic like illness associated with this chronic pain, and that there was no good cure. I explained that vasectomy it is not a 100%  reliable form of birth control, and the risk of pregnancy after vasectomy is approximately 1 in 2000 men who had a negative postvasectomy semen analysis or rare non-motile sperm.  I explained that repeat vasectomy was necessary in less than 1% of vasectomy procedures when employing the type of technique that is performed in the office. I explained that he should refrain from ejaculation for approximately one week following vasectomy. I explained that there are other options for birth control which are permanent and non-permanent; we discussed these.  I explained the rates of surgical complications, such as symptomatic hematoma or infection, are low (1-2%) and vary with the surgeon's experience and criteria used to diagnose the complication.   PMH: Past Medical History:  Diagnosis Date  . Arthritis   . Asthma   . Chickenpox   . Depression   . Frequent headaches   . GERD (gastroesophageal reflux disease)   . Hypertension   . Migraines   . OSA (obstructive sleep apnea)     Surgical History: Past Surgical History:  Procedure Laterality Date  . APPENDECTOMY  1977  . knee Left   . KNEE ARTHROSCOPY Right 1992  . UMBILICAL HERNIA REPAIR  2013    Home Medications:  Allergies as of 05/11/2016      Reactions   Sulfa Antibiotics Anaphylaxis, Hives      Medication List       Accurate as of 05/11/16  9:47 AM. Always use your most recent med list.          albuterol  108 (90 Base) MCG/ACT inhaler Commonly known as:  PROVENTIL HFA;VENTOLIN HFA Inhale 2 puffs into the lungs every 6 (six) hours as needed for wheezing or shortness of breath.   aspirin EC 81 MG tablet Take 81 mg by mouth daily.   azithromycin 250 MG tablet Commonly known as:  ZITHROMAX Take 2 tabs today, then 1 tab daily x 4 days   buPROPion 150 MG 12 hr tablet Commonly known as:  WELLBUTRIN SR TAKE ONE TABLET BY MOUTH TWICE DAILY   diazepam 10 MG tablet Commonly known as:  VALIUM Take 30 minutes prior to the  vasectomy   HYDROcodone-homatropine 5-1.5 MG/5ML syrup Commonly known as:  HYCODAN Take 5 mLs by mouth every 8 (eight) hours as needed for cough.   predniSONE 10 MG tablet Commonly known as:  DELTASONE Take 3 tabs on days 1-2, take 2 tabs on days 3-4, take 1 tab on days 5-6       Allergies:  Allergies  Allergen Reactions  . Sulfa Antibiotics Anaphylaxis and Hives    Family History: Family History  Problem Relation Age of Onset  . Ovarian cancer Mother   . Alcohol abuse Father   . Hyperlipidemia Father   . Hypertension Father   . Arthritis Maternal Grandmother   . Colon cancer Maternal Grandmother   . Ovarian cancer Maternal Grandmother   . Breast cancer Maternal Grandmother   . Hyperlipidemia Maternal Grandfather   . Hypertension Maternal Grandfather   . Hyperlipidemia Paternal Grandmother   . Heart attack Paternal Grandmother   . Hyperlipidemia Paternal Grandfather   . Hypertension Paternal Grandfather   . Prostate cancer Maternal Uncle   . Bladder Cancer Neg Hx   . Kidney disease Neg Hx     Social History:  reports that he has quit smoking. His smoking use included Cigarettes. He smoked 0.50 packs per day. He has never used smokeless tobacco. He reports that he drinks alcohol. He reports that he does not use drugs.  ROS: UROLOGY Frequent Urination?: Yes Hard to postpone urination?: No Burning/pain with urination?: No Get up at night to urinate?: Yes Leakage of urine?: No Urine stream starts and stops?: No Trouble starting stream?: No Do you have to strain to urinate?: No Blood in urine?: No Urinary tract infection?: No Sexually transmitted disease?: No Injury to kidneys or bladder?: No Painful intercourse?: No Weak stream?: No Erection problems?: No Penile pain?: No  Gastrointestinal Nausea?: No Vomiting?: No Indigestion/heartburn?: No Diarrhea?: No Constipation?: Yes  Constitutional Fever: No Night sweats?: No Weight loss?: No Fatigue?:  No  Skin Skin rash/lesions?: No Itching?: No  Eyes Blurred vision?: Yes Double vision?: No  Ears/Nose/Throat Sore throat?: No Sinus problems?: Yes  Hematologic/Lymphatic Swollen glands?: No Easy bruising?: No  Cardiovascular Leg swelling?: No Chest pain?: No  Respiratory Cough?: Yes Shortness of breath?: Yes  Endocrine Excessive thirst?: No  Musculoskeletal Back pain?: No Joint pain?: No  Neurological Headaches?: No Dizziness?: No  Psychologic Depression?: No Anxiety?: No  Physical Exam: BP (!) 146/88   Pulse 85   Ht 6\' 1"  (1.854 m)   Wt (!) 355 lb 3.2 oz (161.1 kg)   BMI 46.86 kg/m   Constitutional: Well nourished. Alert and oriented, No acute distress. HEENT: [place] AT, moist mucus membranes. Trachea midline, no masses. Cardiovascular: No clubbing, cyanosis, or edema. Respiratory: Normal respiratory effort, no increased work of breathing. GI: Abdomen is soft, non tender, non distended, no abdominal masses. Liver and spleen not palpable.  No hernias appreciated.  Stool sample for occult testing is not indicated.   GU: No CVA tenderness.  No bladder fullness or masses.  Patient with circumcised phallus.   Urethral meatus is patent.  No penile discharge. No penile lesions or rashes. Scrotum without lesions, cysts, rashes and/or edema.  Testicles are located scrotally bilaterally. No masses are appreciated in the testicles. Left and right epididymis are normal.  Right hydrocele appreciated.   Rectal: Deferred.   Skin: No rashes, bruises or suspicious lesions. Lymph: No cervical or inguinal adenopathy. Neurologic: Grossly intact, no focal deficits, moving all 4 extremities. Psychiatric: Normal mood and affect.  Laboratory Data: Lab Results  Component Value Date   WBC 6.3 12/20/2013   HGB 16.6 12/20/2013   HCT 50.1 12/20/2013   MCV 97 12/20/2013   PLT 185 12/20/2013    Lab Results  Component Value Date   CREATININE 1.15 04/08/2016    Lab Results   Component Value Date   HGBA1C 5.4 04/08/2016       Component Value Date/Time   CHOL 130 04/08/2016 0814   HDL 43.70 04/08/2016 0814   CHOLHDL 3 04/08/2016 0814   VLDL 15.8 04/08/2016 0814   LDLCALC 71 04/08/2016 0814    Lab Results  Component Value Date   AST 26 04/08/2016   Lab Results  Component Value Date   ALT 29 04/08/2016    Assessment & Plan:    1. Vasectomy consult:  Patient has read and signed the consent.  He is given the pre-op vasectomy instruction sheet.  He is prescribed Valium 10 mg and instructed to take it 30 minutes prior to his vasectomy appointment.  He is to have a driver.  I reemphasized to the patient that this is to be considered a permanent form of birth control, that he is to use an alternative form of birth control until we receive the 3 months specimen and it is cleared of sperm and that this will not prevent STI's.  His questions are answered to his satisfaction and he understands the risks and is willing to proceed with the vasectomy.  I explained to the patient that due to his anatomy, he may best be served by a vasectomy performed in the OR.  We will schedule once the scrotal ultrasound is completed.  2. Right hydrocele  - incidental finding on exam  - scrotal ultrasound for conformation   I spent 30 minutes in a face-to-face conversation concerning the vasectomy procedure and pre-and post op expectations.  Greater than 50% was spent in counseling & coordination of care with the patient.   Return for I will call patient with results.  These notes generated with voice recognition software. I apologize for typographical errors.  Zara Council, [facility] [address], [place], [place] [address] [phone]

## 2016-05-14 ENCOUNTER — Ambulatory Visit: Admission: RE | Admit: 2016-05-14 | Payer: BLUE CROSS/BLUE SHIELD | Source: Ambulatory Visit

## 2016-05-15 ENCOUNTER — Ambulatory Visit: Payer: BLUE CROSS/BLUE SHIELD

## 2016-05-19 ENCOUNTER — Ambulatory Visit
Admission: RE | Admit: 2016-05-19 | Discharge: 2016-05-19 | Disposition: A | Discharge status: Home / Self Care | Payer: BLUE CROSS/BLUE SHIELD | Source: Ambulatory Visit | Attending: Urology | Admitting: Urology

## 2016-05-19 DIAGNOSIS — N432 Other hydrocele: Secondary | ICD-10-CM

## 2016-05-19 DIAGNOSIS — N433 Hydrocele, unspecified: Secondary | ICD-10-CM | POA: Insufficient documentation

## 2016-05-22 ENCOUNTER — Telehealth: Payer: Self-pay | Admitting: Urology

## 2016-05-22 NOTE — Telephone Encounter (Signed)
Pt called and wants to know if his U/S results are back.  Please give pt a call.

## 2016-05-25 ENCOUNTER — Other Ambulatory Visit: Payer: Self-pay | Admitting: Radiology

## 2016-05-25 DIAGNOSIS — N433 Hydrocele, unspecified: Secondary | ICD-10-CM

## 2016-05-25 DIAGNOSIS — Z302 Encounter for sterilization: Secondary | ICD-10-CM

## 2016-05-25 NOTE — Telephone Encounter (Signed)
Pt requests surgery be performed on 06/01/16. Notified pt of pre-admit testing appt on 05/26/16 @2 :30 & to call Friday prior to surgery for arrival time to SDS. Advised pt to hold ASA 81mg  beginning immediately. Pt voices understanding.

## 2016-05-25 NOTE — Telephone Encounter (Signed)
-----   Message from Nori Riis, PA-C sent at 05/22/2016  3:06 PM EST ----- I have spoken with Mr. Sahli regarding his ultrasound results.  His anatomy was such that a vasectomy in the office would be difficult to perform.  He would also like to have his hydroceles addressed at the same time.  So would you please schedule him for a bilateral hydrocelectomy and vasectomy?

## 2016-05-25 NOTE — Telephone Encounter (Signed)
LMOM

## 2016-05-26 ENCOUNTER — Encounter
Admission: RE | Admit: 2016-05-26 | Discharge: 2016-05-26 | Disposition: A | Discharge status: Home / Self Care | Payer: BLUE CROSS/BLUE SHIELD | Source: Ambulatory Visit | Attending: Urology | Admitting: Urology

## 2016-05-26 DIAGNOSIS — Z01812 Encounter for preprocedural laboratory examination: Secondary | ICD-10-CM | POA: Insufficient documentation

## 2016-05-26 HISTORY — DX: Bronchitis, not specified as acute or chronic: J40

## 2016-05-26 LAB — URINALYSIS, ROUTINE W REFLEX MICROSCOPIC
Bacteria, UA: NONE SEEN
Bilirubin Urine: NEGATIVE
GLUCOSE, UA: NEGATIVE mg/dL
KETONES UR: NEGATIVE mg/dL
Leukocytes, UA: NEGATIVE
Nitrite: NEGATIVE
PROTEIN: NEGATIVE mg/dL
Specific Gravity, Urine: 1.016 (ref 1.005–1.030)
pH: 5 (ref 5.0–8.0)

## 2016-05-26 LAB — BASIC METABOLIC PANEL
Anion gap: 4 — ABNORMAL LOW (ref 5–15)
BUN: 12 mg/dL (ref 6–20)
CALCIUM: 8.8 mg/dL — AB (ref 8.9–10.3)
CHLORIDE: 107 mmol/L (ref 101–111)
CO2: 29 mmol/L (ref 22–32)
CREATININE: 1.03 mg/dL (ref 0.61–1.24)
GFR calc Af Amer: >60 mL/min (ref >60)
GFR calc non Af Amer: >60 mL/min (ref >60)
GLUCOSE: 93 mg/dL (ref 65–99)
Potassium: 4.4 mmol/L (ref 3.5–5.1)
Sodium: 140 mmol/L (ref 135–145)

## 2016-05-26 LAB — CBC
HCT: 45.9 % (ref 40.0–52.0)
Hemoglobin: 15.1 g/dL (ref 13.0–18.0)
MCH: 32 pg (ref 26.0–34.0)
MCHC: 33 g/dL (ref 32.0–36.0)
MCV: 97 fL (ref 80.0–100.0)
Platelets: 190 10*3/uL (ref 150–440)
RBC: 4.74 MIL/uL (ref 4.40–5.90)
RDW: 13.9 % (ref 11.5–14.5)
WBC: 8.4 10*3/uL (ref 3.8–10.6)

## 2016-05-26 NOTE — Patient Instructions (Signed)
  Your procedure is scheduled [id number] 06/01/16 , 2018. Report to Same Day Surgery. To find out your arrival time please call [phone] between 1PM - 3PM on Friday 05/29/16 .  Remember: Instructions that are not followed completely may result in serious medical risk, up to and including death, or upon the discretion of your surgeon and anesthesiologist your surgery may need to be rescheduled.    _x___ 1. Do not eat food or drink liquids after midnight. No gum chewing or hard candies.     _x___ 2. No Alcohol for 24 hours before or after surgery.   ____ 3. Bring all medications with you on the day of surgery if instructed.    __x__ 4. Notify your doctor if there is any change in your medical condition     (cold, fever, infections).    __x___ 5. No smoking 24 hours prior to surgery.     Do not wear jewelry, make-up, hairpins, clips or nail polish.  Do not wear lotions, powders, or perfumes.   Do not shave 48 hours prior to surgery. Men may shave face and neck.  Do not bring valuables to the hospital.    [facility] is not responsible for any belongings or valuables.               Contacts, dentures or bridgework may not be worn into surgery.  Leave your suitcase in the car. After surgery it may be brought to your room.  For patients admitted to the hospital, discharge time is determined by your treatment team.   Patients discharged the day of surgery will not be allowed to drive home.    Please read over the following fact sheets that you were given:   [facility] Preparing for Surgery  _x___ Take these medicines the morning of surgery with A SIP OF WATER:    1. buPROPion (WELLBUTRIN SR)    ____ Fleet Enema (as directed)   ____ Use CHG Soap as directed on instruction sheet  ____ Use inhalers on the day of surgery and bring to hospital day of surgery  ____ Stop metformin 2 days prior to surgery    ____ Take 1/2 of usual insulin dose the night before surgery and none on the  morning of surgery.   ____ Stop Coumadin/Plavix/aspirin on does not apply.  __x__ Stop Anti-inflammatories such as Advil, Aleve, Ibuprofen, Motrin, Naproxen, Naprosyn, Goodies powders or aspirin products. OK to take Tylenol.   ____ Stop supplements until after surgery.    _x___ Bring C-Pap to the hospital.

## 2016-05-27 NOTE — Pre-Procedure Instructions (Signed)
UA sent to Dr. Brandon for review. 

## 2016-05-28 LAB — URINE CULTURE

## 2016-06-01 ENCOUNTER — Ambulatory Visit
Admission: RE | Admit: 2016-06-01 | Discharge: 2016-06-01 | Disposition: A | Discharge status: Home / Self Care | Payer: BLUE CROSS/BLUE SHIELD | Source: Ambulatory Visit | Attending: Urology | Admitting: Urology

## 2016-06-01 ENCOUNTER — Ambulatory Visit: Payer: BLUE CROSS/BLUE SHIELD | Admitting: Anesthesiology

## 2016-06-01 ENCOUNTER — Encounter
Admission: RE | Disposition: A | Discharge status: Home / Self Care | Payer: Self-pay | Source: Ambulatory Visit | Attending: Urology

## 2016-06-01 ENCOUNTER — Encounter: Payer: Self-pay | Admitting: *Deleted

## 2016-06-01 DIAGNOSIS — Z811 Family history of alcohol abuse and dependence: Secondary | ICD-10-CM | POA: Diagnosis not present

## 2016-06-01 DIAGNOSIS — Z8041 Family history of malignant neoplasm of ovary: Secondary | ICD-10-CM | POA: Diagnosis not present

## 2016-06-01 DIAGNOSIS — Z8261 Family history of arthritis: Secondary | ICD-10-CM | POA: Insufficient documentation

## 2016-06-01 DIAGNOSIS — Z9889 Other specified postprocedural states: Secondary | ICD-10-CM | POA: Insufficient documentation

## 2016-06-01 DIAGNOSIS — J45909 Unspecified asthma, uncomplicated: Secondary | ICD-10-CM | POA: Diagnosis not present

## 2016-06-01 DIAGNOSIS — Z800 Family history of malignant neoplasm of digestive organs: Secondary | ICD-10-CM | POA: Insufficient documentation

## 2016-06-01 DIAGNOSIS — Z302 Encounter for sterilization: Secondary | ICD-10-CM | POA: Insufficient documentation

## 2016-06-01 DIAGNOSIS — Z79899 Other long term (current) drug therapy: Secondary | ICD-10-CM | POA: Diagnosis not present

## 2016-06-01 DIAGNOSIS — Z8249 Family history of ischemic heart disease and other diseases of the circulatory system: Secondary | ICD-10-CM | POA: Insufficient documentation

## 2016-06-01 DIAGNOSIS — Z803 Family history of malignant neoplasm of breast: Secondary | ICD-10-CM | POA: Insufficient documentation

## 2016-06-01 DIAGNOSIS — D176 Benign lipomatous neoplasm of spermatic cord: Secondary | ICD-10-CM | POA: Insufficient documentation

## 2016-06-01 DIAGNOSIS — Z9049 Acquired absence of other specified parts of digestive tract: Secondary | ICD-10-CM | POA: Diagnosis not present

## 2016-06-01 DIAGNOSIS — F329 Major depressive disorder, single episode, unspecified: Secondary | ICD-10-CM | POA: Insufficient documentation

## 2016-06-01 DIAGNOSIS — Z7952 Long term (current) use of systemic steroids: Secondary | ICD-10-CM | POA: Diagnosis not present

## 2016-06-01 DIAGNOSIS — Z7982 Long term (current) use of aspirin: Secondary | ICD-10-CM | POA: Diagnosis not present

## 2016-06-01 DIAGNOSIS — Z8709 Personal history of other diseases of the respiratory system: Secondary | ICD-10-CM | POA: Insufficient documentation

## 2016-06-01 DIAGNOSIS — N433 Hydrocele, unspecified: Secondary | ICD-10-CM | POA: Diagnosis not present

## 2016-06-01 DIAGNOSIS — Z8349 Family history of other endocrine, nutritional and metabolic diseases: Secondary | ICD-10-CM | POA: Insufficient documentation

## 2016-06-01 DIAGNOSIS — Z8042 Family history of malignant neoplasm of prostate: Secondary | ICD-10-CM | POA: Diagnosis not present

## 2016-06-01 DIAGNOSIS — Z882 Allergy status to sulfonamides status: Secondary | ICD-10-CM | POA: Insufficient documentation

## 2016-06-01 DIAGNOSIS — Z87891 Personal history of nicotine dependence: Secondary | ICD-10-CM | POA: Insufficient documentation

## 2016-06-01 DIAGNOSIS — Z8619 Personal history of other infectious and parasitic diseases: Secondary | ICD-10-CM | POA: Insufficient documentation

## 2016-06-01 DIAGNOSIS — G4733 Obstructive sleep apnea (adult) (pediatric): Secondary | ICD-10-CM | POA: Diagnosis not present

## 2016-06-01 HISTORY — PX: VASECTOMY: SHX75

## 2016-06-01 HISTORY — PX: HYDROCELE EXCISION: SHX482

## 2016-06-01 SURGERY — HYDROCELECTOMY
Anesthesia: General | Site: Scrotum | Laterality: Right | Wound class: Clean Contaminated

## 2016-06-01 MED ORDER — CEFAZOLIN SODIUM-DEXTROSE 2-4 GM/100ML-% IV SOLN
INTRAVENOUS | Status: AC
  Administered 2016-06-01: 2 g via INTRAVENOUS
  Filled 2016-06-01: qty 100

## 2016-06-01 MED ORDER — SUCCINYLCHOLINE CHLORIDE 20 MG/ML IJ SOLN
INTRAMUSCULAR | Status: DC | PRN
  Administered 2016-06-01: 120 mg via INTRAVENOUS

## 2016-06-01 MED ORDER — ROCURONIUM BROMIDE 100 MG/10ML IV SOLN
INTRAVENOUS | Status: DC | PRN
  Administered 2016-06-01: 50 mg via INTRAVENOUS

## 2016-06-01 MED ORDER — CEFAZOLIN SODIUM 1 G IJ SOLR
INTRAMUSCULAR | Status: AC
  Filled 2016-06-01: qty 10

## 2016-06-01 MED ORDER — LIDOCAINE HCL (PF) 1 % IJ SOLN
INTRAMUSCULAR | Status: AC
  Filled 2016-06-01: qty 30

## 2016-06-01 MED ORDER — ROCURONIUM BROMIDE 50 MG/5ML IV SOLN
INTRAVENOUS | Status: AC
  Filled 2016-06-01: qty 1

## 2016-06-01 MED ORDER — FENTANYL CITRATE (PF) 100 MCG/2ML IJ SOLN
INTRAMUSCULAR | Status: DC | PRN
  Administered 2016-06-01: 100 ug via INTRAVENOUS

## 2016-06-01 MED ORDER — ONDANSETRON HCL 4 MG/2ML IJ SOLN
INTRAMUSCULAR | Status: AC
  Filled 2016-06-01: qty 2

## 2016-06-01 MED ORDER — DOCUSATE SODIUM 100 MG PO CAPS: 100.0000 mg | ORAL_CAPSULE | Freq: Two times a day (BID) | ORAL | 0 refills | Status: DC

## 2016-06-01 MED ORDER — FAMOTIDINE 20 MG PO TABS
ORAL_TABLET | ORAL | Status: AC
  Filled 2016-06-01: qty 1

## 2016-06-01 MED ORDER — ONDANSETRON HCL 4 MG/2ML IJ SOLN
INTRAMUSCULAR | Status: DC | PRN
  Administered 2016-06-01: 4 mg via INTRAVENOUS

## 2016-06-01 MED ORDER — LACTATED RINGERS IV SOLN
INTRAVENOUS | Status: DC
  Administered 2016-06-01 (×2): via INTRAVENOUS

## 2016-06-01 MED ORDER — LIDOCAINE HCL (PF) 2 % IJ SOLN
INTRAMUSCULAR | Status: AC
  Filled 2016-06-01: qty 2

## 2016-06-01 MED ORDER — LIDOCAINE HCL (PF) 4 % IJ SOLN
INTRAMUSCULAR | Status: DC | PRN
  Administered 2016-06-01: 4 mL via RESPIRATORY_TRACT

## 2016-06-01 MED ORDER — FAMOTIDINE 20 MG PO TABS
20.0000 mg | ORAL_TABLET | Freq: Once | ORAL | Status: AC
  Administered 2016-06-01: 20 mg via ORAL

## 2016-06-01 MED ORDER — LIDOCAINE HCL (CARDIAC) 20 MG/ML IV SOLN
INTRAVENOUS | Status: DC | PRN
  Administered 2016-06-01: 100 mg via INTRAVENOUS

## 2016-06-01 MED ORDER — MIDAZOLAM HCL 2 MG/2ML IJ SOLN
INTRAMUSCULAR | Status: DC | PRN
  Administered 2016-06-01: 2 mg via INTRAVENOUS

## 2016-06-01 MED ORDER — CEFAZOLIN IN D5W 1 GM/50ML IV SOLN
INTRAVENOUS | Status: DC | PRN
  Administered 2016-06-01: 1 g via INTRAVENOUS

## 2016-06-01 MED ORDER — FENTANYL CITRATE (PF) 100 MCG/2ML IJ SOLN: 25.0000 ug | INTRAMUSCULAR | Status: DC | PRN

## 2016-06-01 MED ORDER — ONDANSETRON HCL 4 MG/2ML IJ SOLN: 4.0000 mg | Freq: Once | INTRAMUSCULAR | Status: DC | PRN

## 2016-06-01 MED ORDER — FENTANYL CITRATE (PF) 250 MCG/5ML IJ SOLN
INTRAMUSCULAR | Status: AC
  Filled 2016-06-01: qty 5

## 2016-06-01 MED ORDER — PROPOFOL 10 MG/ML IV BOLUS
INTRAVENOUS | Status: AC
  Filled 2016-06-01: qty 20

## 2016-06-01 MED ORDER — KETOROLAC TROMETHAMINE 30 MG/ML IJ SOLN
INTRAMUSCULAR | Status: AC
  Filled 2016-06-01: qty 1

## 2016-06-01 MED ORDER — DEXAMETHASONE SODIUM PHOSPHATE 4 MG/ML IJ SOLN
INTRAMUSCULAR | Status: DC | PRN
  Administered 2016-06-01: 5 mg via INTRAVENOUS

## 2016-06-01 MED ORDER — BUPIVACAINE HCL (PF) 0.5 % IJ SOLN
INTRAMUSCULAR | Status: AC
  Filled 2016-06-01: qty 30

## 2016-06-01 MED ORDER — DEXAMETHASONE SODIUM PHOSPHATE 10 MG/ML IJ SOLN
INTRAMUSCULAR | Status: AC
  Filled 2016-06-01: qty 1

## 2016-06-01 MED ORDER — LIDOCAINE HCL 1 % IJ SOLN
INTRAMUSCULAR | Status: DC | PRN
  Administered 2016-06-01: 5.5 mL

## 2016-06-01 MED ORDER — MIDAZOLAM HCL 2 MG/2ML IJ SOLN
INTRAMUSCULAR | Status: AC
  Filled 2016-06-01: qty 2

## 2016-06-01 MED ORDER — KETOROLAC TROMETHAMINE 30 MG/ML IJ SOLN
INTRAMUSCULAR | Status: DC | PRN
  Administered 2016-06-01: 30 mg via INTRAVENOUS

## 2016-06-01 MED ORDER — CEFAZOLIN SODIUM-DEXTROSE 2-4 GM/100ML-% IV SOLN
2.0000 g | INTRAVENOUS | Status: AC
  Administered 2016-06-01: 2 g via INTRAVENOUS

## 2016-06-01 MED ORDER — BUPIVACAINE HCL 0.5 % IJ SOLN
INTRAMUSCULAR | Status: DC | PRN
  Administered 2016-06-01: 5.5 mL

## 2016-06-01 MED ORDER — PROPOFOL 10 MG/ML IV BOLUS
INTRAVENOUS | Status: DC | PRN
  Administered 2016-06-01: 200 mg via INTRAVENOUS

## 2016-06-01 MED ORDER — HYDROCODONE-ACETAMINOPHEN 5-325 MG PO TABS: 1.0000 | ORAL_TABLET | Freq: Four times a day (QID) | ORAL | 0 refills | Status: DC | PRN

## 2016-06-01 SURGICAL SUPPLY — 43 items
BLADE CLIPPER SURG (BLADE) ×4 IMPLANT
BLADE SURG 15 STRL LF DISP TIS (BLADE) ×2 IMPLANT
BLADE SURG 15 STRL SS (BLADE) ×2
CANISTER SUCT 1200ML W/VALVE (MISCELLANEOUS) ×4 IMPLANT
CHLORAPREP W/TINT 26ML (MISCELLANEOUS) ×4 IMPLANT
DRAIN PENROSE 1/4X12 LTX (DRAIN) IMPLANT
DRAPE LAPAROTOMY 77X122 PED (DRAPES) ×4 IMPLANT
DRESSING TELFA 4X3 1S ST N-ADH (GAUZE/BANDAGES/DRESSINGS) ×4 IMPLANT
ELECT CAUTERY NEEDLE TIP 1.0 (MISCELLANEOUS) ×4
ELECT REM PT RETURN 9FT ADLT (ELECTROSURGICAL) ×4
ELECTRODE CAUTERY NEDL TIP 1.0 (MISCELLANEOUS) ×2 IMPLANT
ELECTRODE REM PT RTRN 9FT ADLT (ELECTROSURGICAL) ×2 IMPLANT
GAUZE FLUFF 18X24 1PLY STRL (GAUZE/BANDAGES/DRESSINGS) ×4 IMPLANT
GAUZE SPONGE 4X4 12PLY STRL (GAUZE/BANDAGES/DRESSINGS) IMPLANT
GLOVE BIO SURGEON STRL SZ 6.5 (GLOVE) ×3 IMPLANT
GLOVE BIO SURGEON STRL SZ7 (GLOVE) ×4 IMPLANT
GLOVE BIO SURGEONS STRL SZ 6.5 (GLOVE) ×1
GOWN STRL REUS W/ TWL LRG LVL3 (GOWN DISPOSABLE) ×4 IMPLANT
GOWN STRL REUS W/TWL LRG LVL3 (GOWN DISPOSABLE) ×4
KIT RM TURNOVER STRD PROC AR (KITS) ×4 IMPLANT
LABEL OR SOLS (LABEL) IMPLANT
LIQUID BAND (GAUZE/BANDAGES/DRESSINGS) ×4 IMPLANT
NDL SAFETY 22GX1.5 (NEEDLE) IMPLANT
NEEDLE HYPO 25X1 1.5 SAFETY (NEEDLE) ×4 IMPLANT
NS IRRIG 500ML POUR BTL (IV SOLUTION) ×4 IMPLANT
PACK BASIN MINOR ARMC (MISCELLANEOUS) ×4 IMPLANT
PREP PVP WINGED SPONGE (MISCELLANEOUS) IMPLANT
SUCTION FRAZIER HANDLE 10FR (MISCELLANEOUS)
SUCTION TUBE FRAZIER 10FR DISP (MISCELLANEOUS) IMPLANT
SUPPORETR ATHLETIC LG (MISCELLANEOUS) ×2 IMPLANT
SUPPORTER ATHLETIC LG (MISCELLANEOUS) ×4
SUT CHROMIC 3 0 PS 2 (SUTURE) IMPLANT
SUT CHROMIC 3 0 SH 27 (SUTURE) IMPLANT
SUT CHROMIC 4 0 RB 1X27 (SUTURE) ×4 IMPLANT
SUT ETHILON 3-0 FS-10 30 BLK (SUTURE)
SUT ETHILON NAB PS2 4-0 18IN (SUTURE) IMPLANT
SUT VIC AB 3-0 SH 27 (SUTURE) ×4
SUT VIC AB 3-0 SH 27X BRD (SUTURE) ×4 IMPLANT
SUT VIC AB 4-0 SH 27 (SUTURE)
SUT VIC AB 4-0 SH 27XANBCTRL (SUTURE) IMPLANT
SUTURE EHLN 3-0 FS-10 30 BLK (SUTURE) IMPLANT
SYRINGE 10CC LL (SYRINGE) ×4 IMPLANT
WATER STERILE IRR 1000ML POUR (IV SOLUTION) ×4 IMPLANT

## 2016-06-01 NOTE — Op Note (Signed)
Date of procedure: 06/01/16  Preoperative diagnosis:  1. Desires vasectomy 2. Right symptomatic hydrocele   Postoperative diagnosis:  1. Same as above   Procedure: 1. Bilateral vasectomy 2. Right hydrocelectomy  Surgeon: Hollice Espy, MD  Anesthesia: General  Complications: None  Intraoperative findings: Minimal fluid surrounding the right testicle, large right spermatic cord with lipoma. Bilateral vasectomy uncomplicated.  EBL: Minimal  Specimens: Bilateral vasa  Drains: None  Indication: [name] is a 51 y.o. patient desiring vasectomy also found to have bilateral hydroceles, right greater than left. He is only symptomatic the right..  After reviewing the management options for treatment, he elected to proceed with the above surgical procedure(s). We have discussed the potential benefits and risks of the procedure, side effects of the proposed treatment, the likelihood of the patient achieving the goals of the procedure, and any potential problems that might occur during the procedure or recuperation. Informed consent has been obtained.  Description of procedure:  The patient was taken to the operating room and general anesthesia was induced.  The patient was placed in the supine position, prepped and draped in the usual sterile fashion, and preoperative antibiotics were administered. A preoperative time-out was performed.   An approximate 4 cm incision was made along scrotal skin lines transversely over the right hemiscrotum. Incision was carried down through the dartos using Bovie electrocautery. The right testicle was then delivered through the incision. Tunica vaginalis was opened and a small amount of fluid was drained. A small portion of the hydrocele sac was excised. Overall, there is not a significant amount of fluid within this hydrocele.  The edges of the sac were then carefully hemostased using Bovie electrocautery. Next, attention was turned to inspection of  the cord and cord structures. Overall, the cord was quite thickened with a fairly sizable lipoma of the cord present. The vas was isolated and a portion was excised. The lumen of each vasa including the testicular and abdominal and were fulgurated within the lumen using pinpoint electrocautery. The fascial interposition was then performed using 3-0 Vicryl. The right testicle was then delivered back into the scrotum in the after careful hemostasis was achieved in the scrotum was irrigated. Targis was closed using running 3-0 Vicryl suture. The skin was closed using interrupted 4-0 chromic suture. Next, attention was turned to performing the left-sided vasectomy. The vas was isolated and the left lateral upper scrotum. The over lying skin was opened using a small stab incision and the vas was grasped using a running vascular clamp. Suture was then skeletonized and a portion was excised. Pinpoint cautery was used to fulgurate each lumen. Fascial interposition was also performed on the side. The skin was closed using a simple interrupted Vicryl suture. Dermabond was applied to each of the wounds. Scrotal fluffs, and a scrotal support was were applied. The patient was then repositioned the supine position, reversed anesthesia, taken back in stable condition.  Plan: f/u in 4 weeks for wound check  Hollice Espy, M.D.

## 2016-06-01 NOTE — Transfer of Care (Signed)
Immediate Anesthesia Transfer of Care Note  Patient: [name]  Procedure(s) Performed: Procedure(s): HYDROCELECTOMY ADULT (Right) VASECTOMY (N/A)  Patient Location: PACU  Anesthesia Type:General  Level of Consciousness: awake, alert , oriented and patient cooperative  Airway & Oxygen Therapy: Patient Spontanous Breathing and Patient connected to nasal cannula oxygen  Post-op Assessment: Report given to RN and Post -op Vital signs reviewed and stable  Post vital signs: Reviewed and stable  Last Vitals:  Vitals:   06/01/16 1102  BP: (!) 154/83  Pulse: 84  Resp: 16  Temp: 36.7 C    Last Pain:  Vitals:   06/01/16 1102  TempSrc: Oral         Complications: No apparent anesthesia complications

## 2016-06-01 NOTE — Interval H&P Note (Signed)
History and Physical Interval Note:  06/01/2016 12:54 PM  [name]  has presented today for surgery, with the diagnosis of BILATERAL HYDROCELES ENCOUNTER FOR STERILIZATION  The various methods of treatment have been discussed with the patient and family. After consideration of risks, benefits and other options for treatment, the patient has consented to  Procedure(s): HYDROCELECTOMY ADULT (Bilateral) VASECTOMY (N/A) as a surgical intervention .  The patient's history has been reviewed, patient examined, no change in status, stable for surgery.  I have reviewed the patient's chart and labs.  Questions were answered to the patient's satisfaction.    RRR CTAB  Hollice Espy

## 2016-06-01 NOTE — Anesthesia Procedure Notes (Signed)
Procedure Name: Intubation Date/Time: 06/01/2016 1:35 PM Performed by: Rosaria Ferries, Lakesa Coste Pre-anesthesia Checklist: Patient identified, Emergency Drugs available, Suction available and Patient being monitored Patient Re-evaluated:Patient Re-evaluated prior to inductionOxygen Delivery Method: Circle system utilized Preoxygenation: Pre-oxygenation with 100% oxygen Intubation Type: IV induction Laryngoscope Size: Mac and 3 Grade View: Grade II Tube type: Oral Number of attempts: 1 Airway Equipment and Method: LTA kit utilized Placement Confirmation: ETT inserted through vocal cords under direct vision,  positive ETCO2 and breath sounds checked- equal and bilateral Secured at: 23 cm Tube secured with: Tape Dental Injury: Teeth and Oropharynx as per pre-operative assessment  Difficulty Due To: Difficult Airway- due to reduced neck mobility Future Recommendations: Recommend- induction with short-acting agent, and alternative techniques readily available

## 2016-06-01 NOTE — Anesthesia Post-op Follow-up Note (Cosign Needed)
Anesthesia QCDR form completed.        

## 2016-06-01 NOTE — Anesthesia Preprocedure Evaluation (Addendum)
Anesthesia Evaluation  Patient identified by MRN, date of birth, ID band Patient awake    Reviewed: Allergy & Precautions, NPO status , Patient's Chart, lab work & pertinent test results  Airway Mallampati: II       Dental  (+) Chipped, Poor Dentition   Pulmonary asthma , sleep apnea , former smoker,    Pulmonary exam normal        Cardiovascular hypertension, Normal cardiovascular exam     Neuro/Psych  Headaches, PSYCHIATRIC DISORDERS Depression    GI/Hepatic Neg liver ROS, GERD  ,  Endo/Other  negative endocrine ROS  Renal/GU negative Renal ROS     Musculoskeletal negative musculoskeletal ROS (+)   Abdominal Normal abdominal exam  (+)   Peds  Hematology negative hematology ROS (+)   Anesthesia Other Findings Past Medical History: No date: Asthma     Comment: as a child 03/2016: Bronchitis     Comment: pt has recovered from bronchitis No date: Chickenpox No date: Depression No date: Frequent headaches No date: GERD (gastroesophageal reflux disease)     Comment: when I was younger, better now. No date: Hypertension No date: Migraines     Comment: history of, not now No date: OSA (obstructive sleep apnea)  Reproductive/Obstetrics                            Anesthesia Physical Anesthesia Plan  ASA: III  Anesthesia Plan: General   Post-op Pain Management:    Induction: Intravenous, Cricoid pressure planned and Rapid sequence  Airway Management Planned: Oral ETT  Additional Equipment:   Intra-op Plan:   Post-operative Plan: Extubation in OR  Informed Consent: I have reviewed the patients History and Physical, chart, labs and discussed the procedure including the risks, benefits and alternatives for the proposed anesthesia with the patient or authorized representative who has indicated his/her understanding and acceptance.   Dental advisory given  Plan Discussed with: CRNA  and Surgeon  Anesthesia Plan Comments:         Anesthesia Quick Evaluation

## 2016-06-01 NOTE — Anesthesia Postprocedure Evaluation (Signed)
Anesthesia Post Note  Patient: [name]  Procedure(s) Performed: Procedure(s) (LRB): HYDROCELECTOMY ADULT (Right) VASECTOMY (N/A)  Patient location during evaluation: PACU Anesthesia Type: General Level of consciousness: awake and alert and oriented Pain management: pain level controlled Vital Signs Assessment: post-procedure vital signs reviewed and stable Respiratory status: spontaneous breathing Cardiovascular status: blood pressure returned to baseline Anesthetic complications: no     Last Vitals:  Vitals:   06/01/16 1534 06/01/16 1602  BP: (!) 156/91 139/81  Pulse: 69 69  Resp: 18 18  Temp: 36.3 C     Last Pain:  Vitals:   06/01/16 1602  TempSrc:   PainSc: 2                  Carline Dura

## 2016-06-01 NOTE — Discharge Instructions (Signed)
° °                                                          Vasectomy                                    Patient Education and Post Care   If you were provided a sedative (Valium) you must have someone available to drive you home after your procedure. Avoid strenuous activity for 48 hours after your procedure. This includes any heavy lifting. You may return to work the next day, as long as no strenuous activity is required. Leave bandage in place for the next 24hrs. If you have any difficulty urinating remove you may remove bandage earlier. Then keep area clean and dry. You may shower after 24 hours. Do not take a bath or use a hot tub for five (5) days. You may apply ice or a cold pack to the scrotum as needed, for 10 minutes of every hours. (A bag of frozen peas will mold to the area). This may help reduce any pain or swelling. It may also be helpful to wear supportive underwear, such as briefs. Expect some mild pain, mild swelling of the scrotum and possible slight fluid leak from the site of the puncture. A gauze pad may be applied to the scrotum if there is any leakage from the puncture site. This drainage may continue for several days and is normal. You may continue your normal diet. After the procedure, you will be given 1-2 specimen cups. You need to do sperm checks at 6 and/or 12 weeks. Please bring the specimen back to our office to be sent out for analysis. You may resume having sex 1 week after procedure, if comfortable enough. Until you are told that you are sterile, it is essential that you use another form of birth control until otherwise notified by our office. Take Extra-Strength Tylenol, Motrin or Advil as needed for any pain or discomfort. Follow the package directions regarding dose. Stronger prescription pain medication is provided, but most people do not find that it is necessary. If you experience unusual or severe pain that is not relieved by pain medication, excessive bleeding  or drainage, excessive swelling or redness, foul odor or a fever over 101 F, please contact our office.  [facility] [address], [place], [place] [address] [phone]   AMBULATORY SURGERY  DISCHARGE INSTRUCTIONS   1) The drugs that you were given will stay in your system until tomorrow so for the next 24 hours you should not:  A) Drive an automobile B) Make any legal decisions C) Drink any alcoholic beverage   2) You may resume regular meals tomorrow.  Today it is better to start with liquids and gradually work up to solid foods.  You may eat anything you prefer, but it is better to start with liquids, then soup and crackers, and gradually work up to solid foods.   3) Please notify your doctor immediately if you have any unusual bleeding, trouble breathing, redness and pain at the surgery site, drainage, fever, or pain not relieved by medication.   4) Additional Instructions:

## 2016-06-01 NOTE — H&P (View-Only) (Signed)
05/11/2016 9:47 AM   [name] 1965-10-23 [id number]  Referring provider: Pleas Koch, NP [place], [place] [address]  Chief Complaint  Patient presents with  . VAS Consult    referred by     HPI: [name] is a 51 year old Caucasian male who presents as a referral by Pleas Koch, NP for a vasectomy.  Patient has two children, one son and one daughter, and wishes to end his family unit at this point.  Patient denies any history of chronic prostatitis, epididymitis, orchitis, or other genital pain.  Today, we discussed what the vas deferens is, where it is located, and its function. We reviewed the procedure for vasectomy, it's risks, benefits, alternatives, and likelihood of achieving his goals.   We discussed in detail the procedure, complications, and recovery as well as the need for clearance prior to unprotected intercourse. We discussed that vasectomy does not protect against sexually transmitted diseases. We discussed that this procedure does not result in immediate sterility and that they would need to use other forms of birth control until he has been cleared with a three month negative postvasectomy semen analyses.  I explained that the procedure is considered to be permanent and that attempts at reversal have varying degrees of success. These options include vasectomy reversal, sperm retrieval, and in vitro fertilization; these can be very expensive.   We discussed the chance of postvasectomy pain syndrome which occurs in less than 5% of patients. I explained to the patient that there is no treatment to resolve this chronic pain, and that if it developed I would not be able to help resolve the issue, but that surgery is generally not needed for correction.   I explained there have even been reports of systemic like illness associated with this chronic pain, and that there was no good cure. I explained that vasectomy it is not a 100%  reliable form of birth control, and the risk of pregnancy after vasectomy is approximately 1 in 2000 men who had a negative postvasectomy semen analysis or rare non-motile sperm.  I explained that repeat vasectomy was necessary in less than 1% of vasectomy procedures when employing the type of technique that is performed in the office. I explained that he should refrain from ejaculation for approximately one week following vasectomy. I explained that there are other options for birth control which are permanent and non-permanent; we discussed these.  I explained the rates of surgical complications, such as symptomatic hematoma or infection, are low (1-2%) and vary with the surgeon's experience and criteria used to diagnose the complication.   PMH: Past Medical History:  Diagnosis Date  . Arthritis   . Asthma   . Chickenpox   . Depression   . Frequent headaches   . GERD (gastroesophageal reflux disease)   . Hypertension   . Migraines   . OSA (obstructive sleep apnea)     Surgical History: Past Surgical History:  Procedure Laterality Date  . APPENDECTOMY  1977  . knee Left   . KNEE ARTHROSCOPY Right 1992  . UMBILICAL HERNIA REPAIR  2013    Home Medications:  Allergies as of 05/11/2016      Reactions   Sulfa Antibiotics Anaphylaxis, Hives      Medication List       Accurate as of 05/11/16  9:47 AM. Always use your most recent med list.          albuterol  108 (90 Base) MCG/ACT inhaler Commonly known as:  PROVENTIL HFA;VENTOLIN HFA Inhale 2 puffs into the lungs every 6 (six) hours as needed for wheezing or shortness of breath.   aspirin EC 81 MG tablet Take 81 mg by mouth daily.   azithromycin 250 MG tablet Commonly known as:  ZITHROMAX Take 2 tabs today, then 1 tab daily x 4 days   buPROPion 150 MG 12 hr tablet Commonly known as:  WELLBUTRIN SR TAKE ONE TABLET BY MOUTH TWICE DAILY   diazepam 10 MG tablet Commonly known as:  VALIUM Take 30 minutes prior to the  vasectomy   HYDROcodone-homatropine 5-1.5 MG/5ML syrup Commonly known as:  HYCODAN Take 5 mLs by mouth every 8 (eight) hours as needed for cough.   predniSONE 10 MG tablet Commonly known as:  DELTASONE Take 3 tabs on days 1-2, take 2 tabs on days 3-4, take 1 tab on days 5-6       Allergies:  Allergies  Allergen Reactions  . Sulfa Antibiotics Anaphylaxis and Hives    Family History: Family History  Problem Relation Age of Onset  . Ovarian cancer Mother   . Alcohol abuse Father   . Hyperlipidemia Father   . Hypertension Father   . Arthritis Maternal Grandmother   . Colon cancer Maternal Grandmother   . Ovarian cancer Maternal Grandmother   . Breast cancer Maternal Grandmother   . Hyperlipidemia Maternal Grandfather   . Hypertension Maternal Grandfather   . Hyperlipidemia Paternal Grandmother   . Heart attack Paternal Grandmother   . Hyperlipidemia Paternal Grandfather   . Hypertension Paternal Grandfather   . Prostate cancer Maternal Uncle   . Bladder Cancer Neg Hx   . Kidney disease Neg Hx     Social History:  reports that he has quit smoking. His smoking use included Cigarettes. He smoked 0.50 packs per day. He has never used smokeless tobacco. He reports that he drinks alcohol. He reports that he does not use drugs.  ROS: UROLOGY Frequent Urination?: Yes Hard to postpone urination?: No Burning/pain with urination?: No Get up at night to urinate?: Yes Leakage of urine?: No Urine stream starts and stops?: No Trouble starting stream?: No Do you have to strain to urinate?: No Blood in urine?: No Urinary tract infection?: No Sexually transmitted disease?: No Injury to kidneys or bladder?: No Painful intercourse?: No Weak stream?: No Erection problems?: No Penile pain?: No  Gastrointestinal Nausea?: No Vomiting?: No Indigestion/heartburn?: No Diarrhea?: No Constipation?: Yes  Constitutional Fever: No Night sweats?: No Weight loss?: No Fatigue?:  No  Skin Skin rash/lesions?: No Itching?: No  Eyes Blurred vision?: Yes Double vision?: No  Ears/Nose/Throat Sore throat?: No Sinus problems?: Yes  Hematologic/Lymphatic Swollen glands?: No Easy bruising?: No  Cardiovascular Leg swelling?: No Chest pain?: No  Respiratory Cough?: Yes Shortness of breath?: Yes  Endocrine Excessive thirst?: No  Musculoskeletal Back pain?: No Joint pain?: No  Neurological Headaches?: No Dizziness?: No  Psychologic Depression?: No Anxiety?: No  Physical Exam: BP (!) 146/88   Pulse 85   Ht 6\' 1"  (1.854 m)   Wt (!) 355 lb 3.2 oz (161.1 kg)   BMI 46.86 kg/m   Constitutional: Well nourished. Alert and oriented, No acute distress. HEENT: [place] AT, moist mucus membranes. Trachea midline, no masses. Cardiovascular: No clubbing, cyanosis, or edema. Respiratory: Normal respiratory effort, no increased work of breathing. GI: Abdomen is soft, non tender, non distended, no abdominal masses. Liver and spleen not palpable.  No hernias appreciated.  Stool sample for occult testing is not indicated.   GU: No CVA tenderness.  No bladder fullness or masses.  Patient with circumcised phallus.   Urethral meatus is patent.  No penile discharge. No penile lesions or rashes. Scrotum without lesions, cysts, rashes and/or edema.  Testicles are located scrotally bilaterally. No masses are appreciated in the testicles. Left and right epididymis are normal.  Right hydrocele appreciated.   Rectal: Deferred.   Skin: No rashes, bruises or suspicious lesions. Lymph: No cervical or inguinal adenopathy. Neurologic: Grossly intact, no focal deficits, moving all 4 extremities. Psychiatric: Normal mood and affect.  Laboratory Data: Lab Results  Component Value Date   WBC 6.3 12/20/2013   HGB 16.6 12/20/2013   HCT 50.1 12/20/2013   MCV 97 12/20/2013   PLT 185 12/20/2013    Lab Results  Component Value Date   CREATININE 1.15 04/08/2016    Lab Results   Component Value Date   HGBA1C 5.4 04/08/2016       Component Value Date/Time   CHOL 130 04/08/2016 0814   HDL 43.70 04/08/2016 0814   CHOLHDL 3 04/08/2016 0814   VLDL 15.8 04/08/2016 0814   LDLCALC 71 04/08/2016 0814    Lab Results  Component Value Date   AST 26 04/08/2016   Lab Results  Component Value Date   ALT 29 04/08/2016    Assessment & Plan:    1. Vasectomy consult:  Patient has read and signed the consent.  He is given the pre-op vasectomy instruction sheet.  He is prescribed Valium 10 mg and instructed to take it 30 minutes prior to his vasectomy appointment.  He is to have a driver.  I reemphasized to the patient that this is to be considered a permanent form of birth control, that he is to use an alternative form of birth control until we receive the 3 months specimen and it is cleared of sperm and that this will not prevent STI's.  His questions are answered to his satisfaction and he understands the risks and is willing to proceed with the vasectomy.  I explained to the patient that due to his anatomy, he may best be served by a vasectomy performed in the OR.  We will schedule once the scrotal ultrasound is completed.  2. Right hydrocele  - incidental finding on exam  - scrotal ultrasound for conformation   I spent 30 minutes in a face-to-face conversation concerning the vasectomy procedure and pre-and post op expectations.  Greater than 50% was spent in counseling & coordination of care with the patient.   Return for I will call patient with results.  These notes generated with voice recognition software. I apologize for typographical errors.  Zara Council, [facility] [address], [place], [place] [address] [phone]

## 2016-06-02 ENCOUNTER — Encounter: Payer: Self-pay | Admitting: Urology

## 2016-06-03 LAB — SURGICAL PATHOLOGY

## 2016-07-05 NOTE — Progress Notes (Deleted)
07/06/2016 6:31 PM   [name] 17-Mar-1966 [id number]  Referring provider: Pleas Koch, NP [place], [place] [address]  No chief complaint on file.   HPI: 51 yo WM who is status post right hydrocelectomy and bilateral vasectomy on 06/01/2016 with Dr. Erlene Quan.  His postoperative course was uneventful and as expected.  PMH: Past Medical History:  Diagnosis Date  . Asthma    as a child  . Bronchitis 03/2016   pt has recovered from bronchitis  . Chickenpox   . Depression   . Frequent headaches   . GERD (gastroesophageal reflux disease)    when I was younger, better now.  . Hypertension   . Migraines    history of, not now  . OSA (obstructive sleep apnea)     Surgical History: Past Surgical History:  Procedure Laterality Date  . APPENDECTOMY  1977  . HERNIA REPAIR    . HYDROCELE EXCISION Right 06/01/2016   Procedure: HYDROCELECTOMY ADULT;  Surgeon: Hollice Espy, MD;  Location: [facility] ORS;  Service: Urology;  Laterality: Right;  . knee Left 1994  . KNEE ARTHROSCOPY Right 1992  . UMBILICAL HERNIA REPAIR  2013  . VASECTOMY N/A 06/01/2016   Procedure: VASECTOMY;  Surgeon: Hollice Espy, MD;  Location: [facility] ORS;  Service: Urology;  Laterality: N/A;    Home Medications:  Allergies as of 07/06/2016      Reactions   Sulfa Antibiotics Anaphylaxis, Hives      Medication List       Accurate as of 07/05/16  6:31 PM. Always use your most recent med list.          albuterol 108 (90 Base) MCG/ACT inhaler Commonly known as:  PROVENTIL HFA;VENTOLIN HFA Inhale 2 puffs into the lungs every 6 (six) hours as needed for wheezing or shortness of breath.   buPROPion 150 MG 12 hr tablet Commonly known as:  WELLBUTRIN SR TAKE ONE TABLET BY MOUTH TWICE DAILY   docusate sodium 100 MG capsule Commonly known as:  COLACE Take 1 capsule (100 mg total) by mouth 2 (two) times daily.   HYDROcodone-acetaminophen 5-325 MG tablet Commonly known as:   NORCO/VICODIN Take 1-2 tablets by mouth every 6 (six) hours as needed for moderate pain.       Allergies:  Allergies  Allergen Reactions  . Sulfa Antibiotics Anaphylaxis and Hives    Family History: Family History  Problem Relation Age of Onset  . Ovarian cancer Mother   . Alcohol abuse Father   . Hyperlipidemia Father   . Hypertension Father   . Arthritis Maternal Grandmother   . Colon cancer Maternal Grandmother   . Ovarian cancer Maternal Grandmother   . Breast cancer Maternal Grandmother   . Hyperlipidemia Maternal Grandfather   . Hypertension Maternal Grandfather   . Hyperlipidemia Paternal Grandmother   . Heart attack Paternal Grandmother   . Hyperlipidemia Paternal Grandfather   . Hypertension Paternal Grandfather   . Prostate cancer Maternal Uncle   . Bladder Cancer Neg Hx   . Kidney disease Neg Hx     Social History:  reports that he has quit smoking. His smoking use included Cigarettes. He smoked 0.50 packs per day. He has never used smokeless tobacco. He reports that he drinks about 2.4 - 3.6 oz of alcohol per week . He reports that he does not use drugs.  ROS:  Physical Exam: There were no vitals taken for this visit.  Constitutional: Well nourished. Alert and oriented, No acute distress. HEENT: [place] AT, moist mucus membranes. Trachea midline, no masses. Cardiovascular: No clubbing, cyanosis, or edema. Respiratory: Normal respiratory effort, no increased work of breathing. GI: Abdomen is soft, non tender, non distended, no abdominal masses. Liver and spleen not palpable.  No hernias appreciated.  Stool sample for occult testing is not indicated.   GU: No CVA tenderness.  No bladder fullness or masses.  Patient with circumcised phallus.   Urethral meatus is patent.  No penile discharge. No penile lesions or rashes. Scrotum without lesions, cysts, rashes and/or edema.  Testicles are located scrotally  bilaterally. No masses are appreciated in the testicles. Left and right epididymis are normal.  Right hydrocele appreciated.   Rectal: Deferred.   Skin: No rashes, bruises or suspicious lesions. Lymph: No cervical or inguinal adenopathy. Neurologic: Grossly intact, no focal deficits, moving all 4 extremities. Psychiatric: Normal mood and affect.  Laboratory Data: Lab Results  Component Value Date   WBC 8.4 05/26/2016   HGB 15.1 05/26/2016   HCT 45.9 05/26/2016   MCV 97.0 05/26/2016   PLT 190 05/26/2016    Lab Results  Component Value Date   CREATININE 1.03 05/26/2016    Lab Results  Component Value Date   HGBA1C 5.4 04/08/2016       Component Value Date/Time   CHOL 130 04/08/2016 0814   HDL 43.70 04/08/2016 0814   CHOLHDL 3 04/08/2016 0814   VLDL 15.8 04/08/2016 0814   LDLCALC 71 04/08/2016 0814    Lab Results  Component Value Date   AST 26 04/08/2016   Lab Results  Component Value Date   ALT 29 04/08/2016    Assessment & Plan:    1. Status post bilateral vasectomy   - Patient reminded to use alternative forms of birth control until we receive a clear specimen in 2 months  2. Status post right hydrocelectomy  - incidental finding on exam    No Follow-up on file.  These notes generated with voice recognition software. I apologize for typographical errors.  Zara Council, [facility] [address] [place], [place] [address] [phone]

## 2016-07-06 ENCOUNTER — Ambulatory Visit: Payer: BLUE CROSS/BLUE SHIELD | Admitting: Urology

## 2016-07-12 NOTE — Progress Notes (Signed)
07/13/2016 10:16 AM   [name] Aug 09, 1965 [id number]  Referring provider: Pleas Koch, NP [name], [name] [id number]  Chief Complaint  Patient presents with  . Routine Post Op    Wound Check Hydrocelectomy    HPI: 51 yo WM who is status post right hydrocelectomy and bilateral vasectomy on 06/01/2016 with Dr. Erlene Leblanc.  His postoperative course was uneventful and as expected.   He is no longer having scrotal swelling.  He is not having fevers, chills, nausea or vomiting.      PMH: Past Medical History:  Diagnosis Date  . Asthma    as a child  . Bronchitis 03/2016   pt has recovered from bronchitis  . Chickenpox   . Depression   . Frequent headaches   . GERD (gastroesophageal reflux disease)    when I was younger, better now.  . Hypertension   . Migraines    history of, not now  . OSA (obstructive sleep apnea)     Surgical History: Past Surgical History:  Procedure Laterality Date  . APPENDECTOMY  1977  . HERNIA REPAIR    . HYDROCELE EXCISION Right 06/01/2016   Procedure: HYDROCELECTOMY ADULT;  Surgeon: Devin Espy, MD;  Location: [facility] ORS;  Service: Urology;  Laterality: Right;  . knee Left 1994  . KNEE ARTHROSCOPY Right 1992  . UMBILICAL HERNIA REPAIR  2013  . VASECTOMY N/A 06/01/2016   Procedure: VASECTOMY;  Surgeon: Devin Espy, MD;  Location: [facility] ORS;  Service: Urology;  Laterality: N/A;    Home Medications:  Allergies as of 07/13/2016      Reactions   Sulfa Antibiotics Anaphylaxis, Hives      Medication List       Accurate as of 07/13/16 10:16 AM. Always use your most recent med list.          albuterol 108 (90 Base) MCG/ACT inhaler Commonly known as:  PROVENTIL HFA;VENTOLIN HFA Inhale 2 puffs into the lungs every 6 (six) hours as needed for wheezing or shortness of breath.   buPROPion 150 MG 12 hr tablet Commonly known as:  WELLBUTRIN SR TAKE ONE TABLET BY MOUTH TWICE DAILY   docusate sodium 100 MG  capsule Commonly known as:  COLACE Take 1 capsule (100 mg total) by mouth 2 (two) times daily.   HYDROcodone-acetaminophen 5-325 MG tablet Commonly known as:  NORCO/VICODIN Take 1-2 tablets by mouth every 6 (six) hours as needed for moderate pain.       Allergies:  Allergies  Allergen Reactions  . Sulfa Antibiotics Anaphylaxis and Hives    Family History: Family History  Problem Relation Age of Onset  . Ovarian cancer Mother   . Alcohol abuse Father   . Hyperlipidemia Father   . Hypertension Father   . Arthritis Maternal Grandmother   . Colon cancer Maternal Grandmother   . Ovarian cancer Maternal Grandmother   . Breast cancer Maternal Grandmother   . Hyperlipidemia Maternal Grandfather   . Hypertension Maternal Grandfather   . Hyperlipidemia Paternal Grandmother   . Heart attack Paternal Grandmother   . Hyperlipidemia Paternal Grandfather   . Hypertension Paternal Grandfather   . Prostate cancer Maternal Uncle   . Bladder Cancer Neg Hx   . Kidney disease Neg Hx     Social History:  reports that he has quit smoking. His smoking use included Cigarettes. He smoked 0.50 packs per day. He has never used smokeless tobacco. He reports that he drinks about 2.4 -  3.6 oz of alcohol per week . He reports that he does not use drugs.  ROS: UROLOGY Frequent Urination?: No Hard to postpone urination?: No Burning/pain with urination?: No Get up at night to urinate?: No Leakage of urine?: No Urine stream starts and stops?: No Trouble starting stream?: No Do you have to strain to urinate?: No Blood in urine?: No Urinary tract infection?: No Sexually transmitted disease?: No Injury to kidneys or bladder?: No Painful intercourse?: No Weak stream?: No Erection problems?: No Penile pain?: No  Gastrointestinal Nausea?: No Vomiting?: No Indigestion/heartburn?: No Diarrhea?: No Constipation?: No  Constitutional Fever: No Night sweats?: No Weight loss?: No Fatigue?:  No  Skin Skin rash/lesions?: No Itching?: No  Eyes Blurred vision?: No Double vision?: No  Ears/Nose/Throat Sore throat?: No Sinus problems?: No  Hematologic/Lymphatic Swollen glands?: No Easy bruising?: No  Cardiovascular Leg swelling?: No Chest pain?: No  Respiratory Cough?: No Shortness of breath?: No  Endocrine Excessive thirst?: No  Musculoskeletal Back pain?: No Joint pain?: No  Neurological Headaches?: No Dizziness?: No  Psychologic Depression?: No Anxiety?: No  Physical Exam: BP (!) 149/95   Pulse 86   Ht 6\' 1"  (1.854 m)   Wt (!) 358 lb 9.6 oz (162.7 kg)   BMI 47.31 kg/m   Constitutional: Well nourished. Alert and oriented, No acute distress. HEENT: [place] AT, moist mucus membranes. Trachea midline, no masses. Cardiovascular: No clubbing, cyanosis, or edema. Respiratory: Normal respiratory effort, no increased work of breathing. GI: Abdomen is soft, non tender, non distended, no abdominal masses. Liver and spleen not palpable.  No hernias appreciated.  Stool sample for occult testing is not indicated.   GU: No CVA tenderness.  No bladder fullness or masses.  Patient with circumcised phallus.   Urethral meatus is patent.  No penile discharge. No penile lesions or rashes. Scrotum without lesions, cysts, rashes and/or edema.  Testicles are located scrotally bilaterally. No masses are appreciated in the testicles. Left and right epididymis are normal.  Incisions are clean and dry.   Rectal: Deferred.   Skin: No rashes, bruises or suspicious lesions. Lymph: No cervical or inguinal adenopathy. Neurologic: Grossly intact, no focal deficits, moving all 4 extremities. Psychiatric: Normal mood and affect.  Laboratory Data: Lab Results  Component Value Date   WBC 8.4 05/26/2016   HGB 15.1 05/26/2016   HCT 45.9 05/26/2016   MCV 97.0 05/26/2016   PLT 190 05/26/2016    Lab Results  Component Value Date   CREATININE 1.03 05/26/2016    Lab Results   Component Value Date   HGBA1C 5.4 04/08/2016       Component Value Date/Time   CHOL 130 04/08/2016 0814   HDL 43.70 04/08/2016 0814   CHOLHDL 3 04/08/2016 0814   VLDL 15.8 04/08/2016 0814   LDLCALC 71 04/08/2016 0814    Lab Results  Component Value Date   AST 26 04/08/2016   Lab Results  Component Value Date   ALT 29 04/08/2016    Assessment & Plan:    1. Status post bilateral vasectomy   - Patient reminded to use alternative forms of birth control until we receive a clear specimen in 2 months  2. Status post right hydrocelectomy  - incidental finding on exam    Return in about 2 months (around 09/12/2016) for post vasectomy sample.  These notes generated with voice recognition software. I apologize for typographical errors.  Zara Council, PA-C  [facility] [address], [place], [place] [address] [phone]  

## 2016-07-13 ENCOUNTER — Ambulatory Visit (INDEPENDENT_AMBULATORY_CARE_PROVIDER_SITE_OTHER): Payer: BLUE CROSS/BLUE SHIELD | Admitting: Urology

## 2016-07-13 ENCOUNTER — Encounter: Payer: Self-pay | Admitting: Urology

## 2016-07-13 VITALS — BP 149/95 | HR 86 | Ht 73.0 in | Wt 358.6 lb

## 2016-07-13 DIAGNOSIS — Z9852 Vasectomy status: Secondary | ICD-10-CM

## 2016-07-13 DIAGNOSIS — Z9889 Other specified postprocedural states: Secondary | ICD-10-CM

## 2016-09-18 ENCOUNTER — Telehealth: Payer: Self-pay | Admitting: Primary Care

## 2016-09-18 NOTE — Telephone Encounter (Signed)
Left message asking pt to call if he has any questions regarding his insurance coverage.  Because his insurance with [employer] states [place] or [facility], we would be considered a more expensive option for him should he continue his care at a [facility] facility

## 2017-09-28 ENCOUNTER — Encounter: Payer: Self-pay | Admitting: Emergency Medicine

## 2017-09-28 ENCOUNTER — Other Ambulatory Visit: Payer: Self-pay

## 2017-09-28 ENCOUNTER — Emergency Department
Admission: EM | Admit: 2017-09-28 | Discharge: 2017-09-28 | Disposition: A | Discharge status: Home / Self Care | Payer: BLUE CROSS/BLUE SHIELD | Attending: Emergency Medicine | Admitting: Emergency Medicine

## 2017-09-28 DIAGNOSIS — Z79899 Other long term (current) drug therapy: Secondary | ICD-10-CM | POA: Insufficient documentation

## 2017-09-28 DIAGNOSIS — I10 Essential (primary) hypertension: Secondary | ICD-10-CM | POA: Insufficient documentation

## 2017-09-28 DIAGNOSIS — Z87891 Personal history of nicotine dependence: Secondary | ICD-10-CM | POA: Insufficient documentation

## 2017-09-28 DIAGNOSIS — J45909 Unspecified asthma, uncomplicated: Secondary | ICD-10-CM | POA: Insufficient documentation

## 2017-09-28 DIAGNOSIS — Y999 Unspecified external cause status: Secondary | ICD-10-CM | POA: Insufficient documentation

## 2017-09-28 DIAGNOSIS — S81812A Laceration without foreign body, left lower leg, initial encounter: Secondary | ICD-10-CM | POA: Insufficient documentation

## 2017-09-28 DIAGNOSIS — W269XXA Contact with unspecified sharp object(s), initial encounter: Secondary | ICD-10-CM | POA: Insufficient documentation

## 2017-09-28 DIAGNOSIS — Y939 Activity, unspecified: Secondary | ICD-10-CM | POA: Insufficient documentation

## 2017-09-28 DIAGNOSIS — Z23 Encounter for immunization: Secondary | ICD-10-CM | POA: Insufficient documentation

## 2017-09-28 DIAGNOSIS — Y929 Unspecified place or not applicable: Secondary | ICD-10-CM | POA: Insufficient documentation

## 2017-09-28 MED ORDER — LIDOCAINE-EPINEPHRINE (PF) 1 %-1:200000 IJ SOLN
30.0000 mL | Freq: Once | INTRAMUSCULAR | Status: AC
  Administered 2017-09-28: 30 mL
  Filled 2017-09-28: qty 30

## 2017-09-28 MED ORDER — CEPHALEXIN 500 MG PO CAPS: 500.0000 mg | ORAL_CAPSULE | Freq: Two times a day (BID) | ORAL | 0 refills | Status: AC

## 2017-09-28 MED ORDER — LIDOCAINE-EPINEPHRINE 1 %-1:100000 IJ SOLN
INTRAMUSCULAR | Status: AC
  Filled 2017-09-28: qty 1

## 2017-09-28 MED ORDER — TRAMADOL HCL 50 MG PO TABS: 50.0000 mg | ORAL_TABLET | Freq: Four times a day (QID) | ORAL | 0 refills | Status: DC | PRN

## 2017-09-28 MED ORDER — BACITRACIN ZINC 500 UNIT/GM EX OINT: 1.0000 "application " | TOPICAL_OINTMENT | Freq: Once | CUTANEOUS | Status: DC

## 2017-09-28 MED ORDER — TETANUS-DIPHTH-ACELL PERTUSSIS 5-2.5-18.5 LF-MCG/0.5 IM SUSP
0.5000 mL | Freq: Once | INTRAMUSCULAR | Status: AC
  Administered 2017-09-28: 0.5 mL via INTRAMUSCULAR
  Filled 2017-09-28: qty 0.5
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
## 2017-12-11 IMAGING — DX DG CHEST 2V
2 series · 2 of 2 positions shown · non-contrast
Comparison: None.

CLINICAL DATA: Cough and congestion

EXAM:
CHEST  2 VIEW

[chest pa]
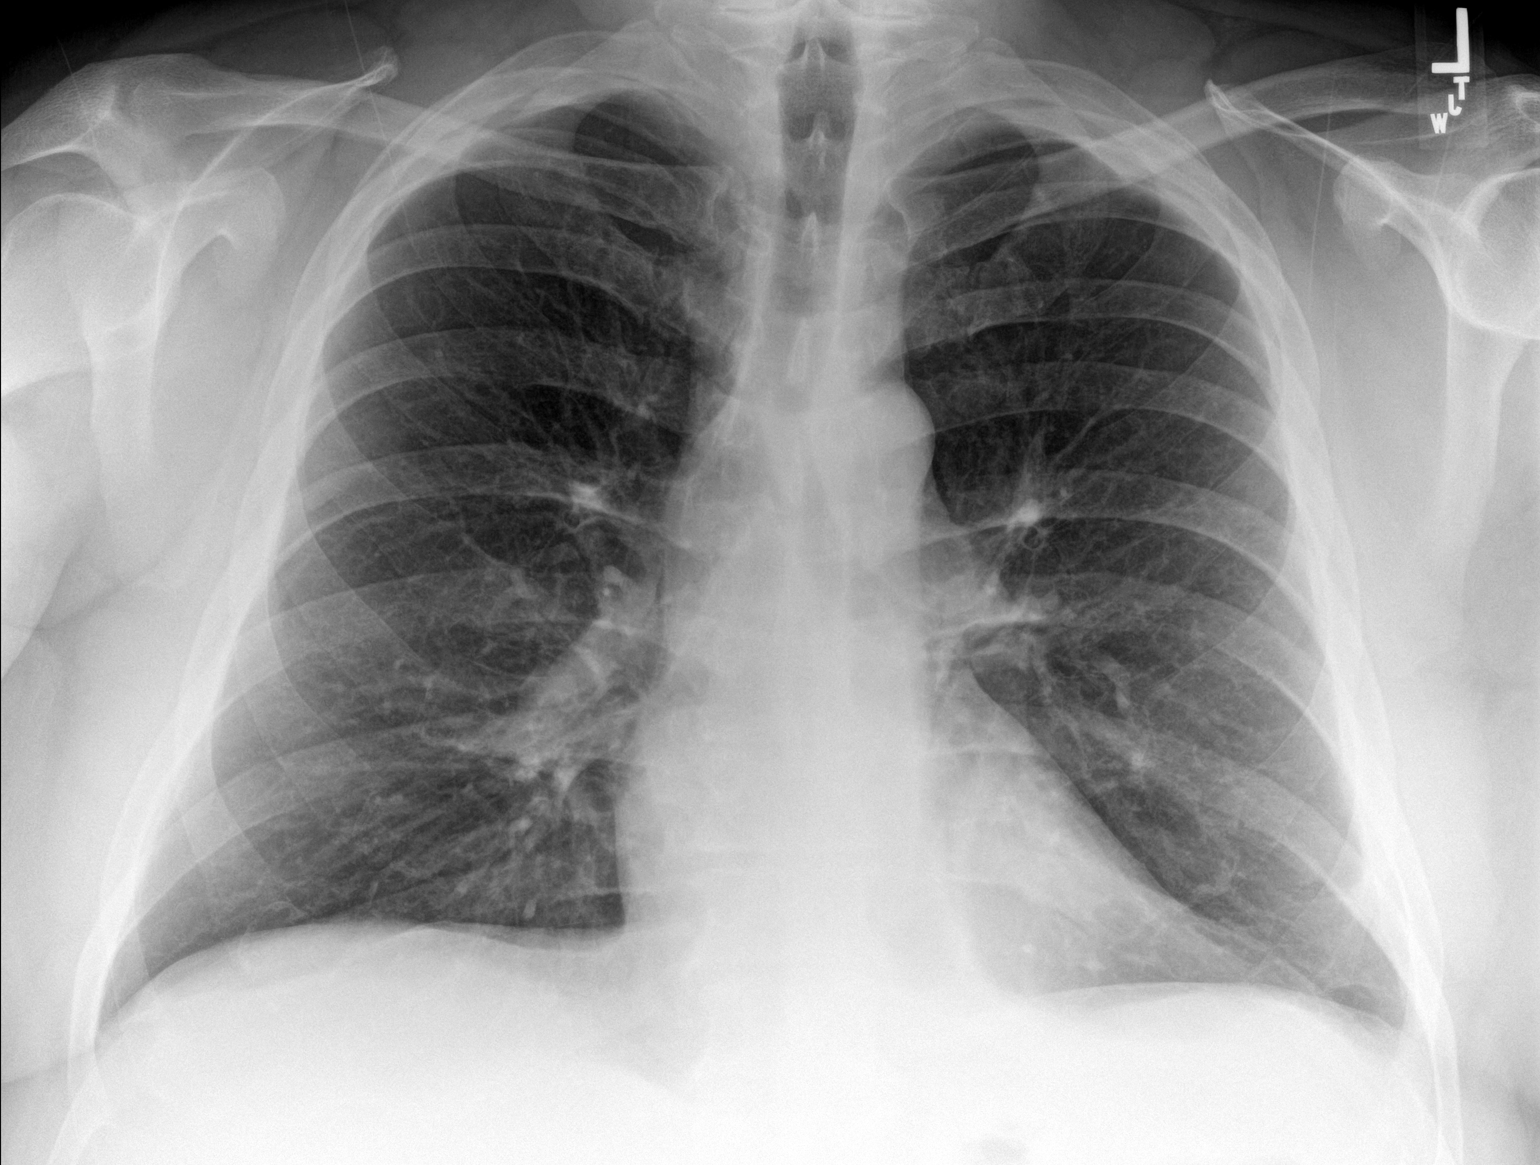

[chest lat]
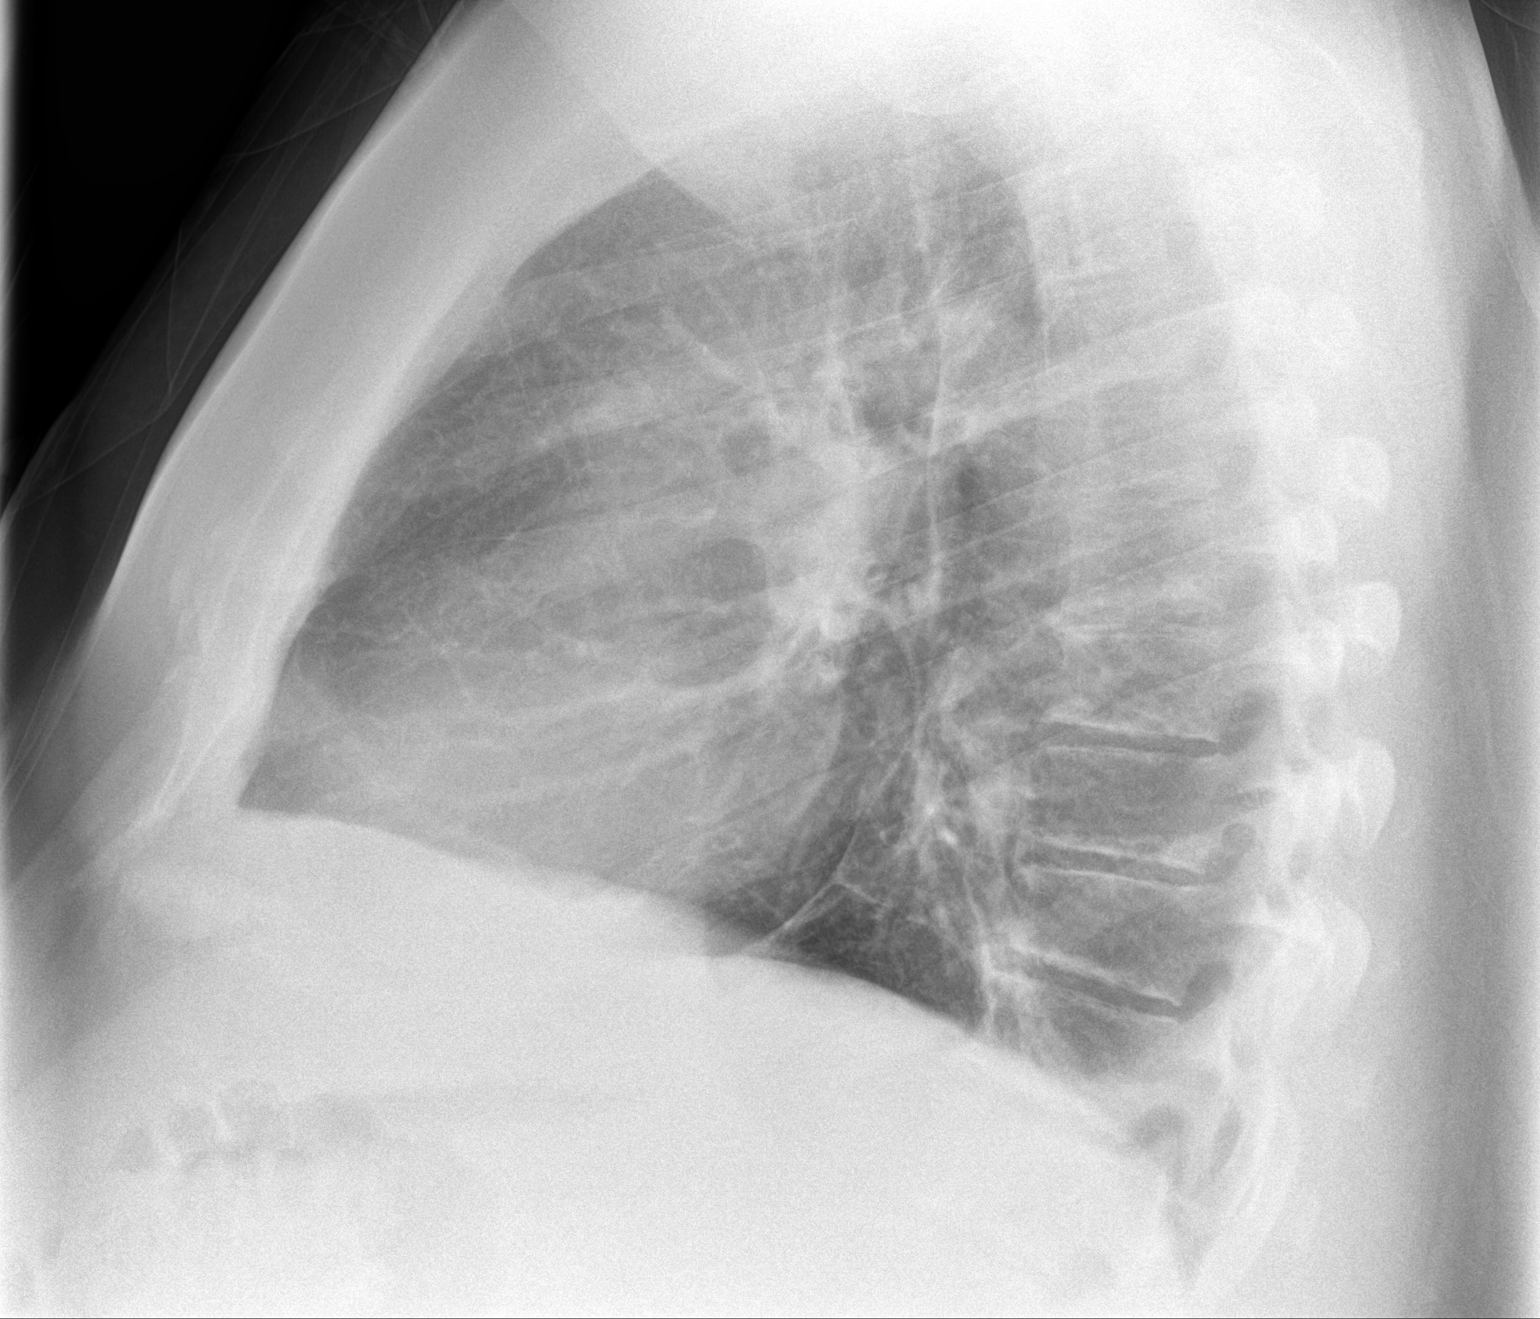

[2 of 2 positions shown; findings below may reference images not displayed]

FINDINGS: The heart size and mediastinal contours are within normal limits.
Both lungs are clear. The visualized skeletal structures are
unremarkable.
IMPRESSION: No active cardiopulmonary disease.

## 2019-07-28 ENCOUNTER — Ambulatory Visit: Payer: Self-pay

## 2019-07-31 ENCOUNTER — Ambulatory Visit: Payer: Self-pay

## 2019-08-03 ENCOUNTER — Ambulatory Visit: Payer: Self-pay | Attending: Internal Medicine

## 2019-08-03 DIAGNOSIS — Z23 Encounter for immunization: Secondary | ICD-10-CM

## 2019-08-03 NOTE — Progress Notes (Signed)
   Covid-19 Vaccination Clinic  Name:  [name]    MRN: [id number] DOB: 14-Dec-1965  08/03/2019  Mr. Kappel was observed post Covid-19 immunization for 30 minutes based on pre-vaccination screening without incident. He was provided with Vaccine Information Sheet and instruction to access the V-Safe system.   Mr. Walsingham was instructed to call 911 with any severe reactions post vaccine: Marland Kitchen Difficulty breathing  . Swelling of face and throat  . A fast heartbeat  . A bad rash all over body  . Dizziness and weakness   Immunizations Administered    Name Date Dose VIS Date Route   Pfizer COVID-19 Vaccine 08/03/2019 11:42 AM 0.3 mL 04/07/2019 Intramuscular   Manufacturer: [place]   Lot: [id number]   [place]: [id number]

## 2019-08-29 ENCOUNTER — Ambulatory Visit: Payer: Self-pay | Attending: Internal Medicine

## 2019-08-29 DIAGNOSIS — Z23 Encounter for immunization: Secondary | ICD-10-CM

## 2019-08-29 NOTE — Progress Notes (Signed)
   Covid-19 Vaccination Clinic  Name:  [name]    MRN: [id number] DOB: [birth year]  08/29/2019  [name] was observed post Covid-19 immunization for 15 minutes without incident. He was provided with Vaccine Information Sheet and instruction to access the V-Safe system.   [name] was instructed to call 911 with any severe reactions post vaccine: Marland Kitchen Difficulty breathing  . Swelling of face and throat  . A fast heartbeat  . A bad rash all over body  . Dizziness and weakness   Immunizations Administered    Name Date Dose VIS Date Route   Pfizer COVID-19 Vaccine 08/29/2019 10:51 AM 0.3 mL 06/21/2018 Intramuscular   Manufacturer: [place]   Lot: [id number]   [place]: [id number]

## 2022-05-14 ENCOUNTER — Emergency Department
Admission: EM | Admit: 2022-05-14 | Discharge: 2022-05-15 | Disposition: A | Discharge status: Home / Self Care | Payer: BLUE CROSS/BLUE SHIELD | Attending: Emergency Medicine | Admitting: Emergency Medicine

## 2022-05-14 ENCOUNTER — Emergency Department: Payer: BLUE CROSS/BLUE SHIELD

## 2022-05-14 ENCOUNTER — Other Ambulatory Visit: Payer: Self-pay

## 2022-05-14 ENCOUNTER — Encounter: Payer: Self-pay | Admitting: *Deleted

## 2022-05-14 DIAGNOSIS — R519 Headache, unspecified: Secondary | ICD-10-CM | POA: Diagnosis present

## 2022-05-14 DIAGNOSIS — F10230 Alcohol dependence with withdrawal, uncomplicated: Secondary | ICD-10-CM | POA: Insufficient documentation

## 2022-05-14 DIAGNOSIS — G4730 Sleep apnea, unspecified: Secondary | ICD-10-CM | POA: Diagnosis not present

## 2022-05-14 DIAGNOSIS — F10930 Alcohol use, unspecified with withdrawal, uncomplicated: Secondary | ICD-10-CM

## 2022-05-14 LAB — BASIC METABOLIC PANEL
Anion gap: 9 (ref 5–15)
BUN: 9 mg/dL (ref 6–20)
CO2: 23 mmol/L (ref 22–32)
Calcium: 8.7 mg/dL — ABNORMAL LOW (ref 8.9–10.3)
Chloride: 102 mmol/L (ref 98–111)
Creatinine, Ser: 0.84 mg/dL (ref 0.61–1.24)
GFR, Estimated: >60 mL/min (ref >60)
Glucose, Bld: 103 mg/dL — ABNORMAL HIGH (ref 70–99)
Potassium: 3.7 mmol/L (ref 3.5–5.1)
Sodium: 134 mmol/L — ABNORMAL LOW (ref 135–145)

## 2022-05-14 LAB — CBC
HCT: 45.4 % (ref 39.0–52.0)
Hemoglobin: 15.4 g/dL (ref 13.0–17.0)
MCH: 33.8 pg (ref 26.0–34.0)
MCHC: 33.9 g/dL (ref 30.0–36.0)
MCV: 99.6 fL (ref 80.0–100.0)
Platelets: 94 10*3/uL — ABNORMAL LOW (ref 150–400)
RBC: 4.56 MIL/uL (ref 4.22–5.81)
RDW: 13.5 % (ref 11.5–15.5)
WBC: 5.4 10*3/uL (ref 4.0–10.5)
nRBC: 0 % (ref 0.0–0.2)

## 2022-05-14 LAB — ETHANOL: Alcohol, Ethyl (B): <10 mg/dL (ref <10)

## 2022-05-14 LAB — TROPONIN I (HIGH SENSITIVITY): Troponin I (High Sensitivity): 14 ng/L (ref <18)

## 2022-05-14 NOTE — ED Triage Notes (Addendum)
Pt reports sob and headache for 1 day.  No cough.  Cig smoker.  Pt also reports chest pain.  Pt has n/v since this am  pt alert.  Pt reports he stopped drinking etoh at 4am today.  Pt states he thinks he is withdrawing.

## 2022-05-15 MED ORDER — THIAMINE HCL 100 MG PO TABS
100.0000 mg | ORAL_TABLET | Freq: Once | ORAL | Status: AC
  Administered 2022-05-15: 100 mg via ORAL
  Filled 2022-05-15 (×2): qty 1

## 2022-05-15 MED ORDER — CHLORDIAZEPOXIDE HCL 5 MG PO CAPS: ORAL_CAPSULE | ORAL | 0 refills | Status: DC

## 2022-05-15 MED ORDER — LORAZEPAM 2 MG PO TABS
2.0000 mg | ORAL_TABLET | Freq: Once | ORAL | Status: AC
  Administered 2022-05-15: 2 mg via ORAL
  Filled 2022-05-15: qty 1

## 2022-05-15 MED ORDER — CHLORDIAZEPOXIDE HCL 25 MG PO CAPS
50.0000 mg | ORAL_CAPSULE | Freq: Once | ORAL | Status: AC
  Administered 2022-05-15: 50 mg via ORAL
  Filled 2022-05-15: qty 2

## 2022-05-15 MED ORDER — ONDANSETRON 4 MG PO TBDP
4.0000 mg | ORAL_TABLET | Freq: Once | ORAL | Status: AC
  Administered 2022-05-15: 4 mg via ORAL
  Filled 2022-05-15: qty 1

## 2022-05-15 NOTE — ED Provider Notes (Signed)
[facility] Provider Note    Event Date/Time   First MD Initiated Contact with Patient 05/14/22 2330     (approximate)   History   Shortness of Breath   HPI  [name] is a 57 y.o. male  Who reports that he is an alcoholic.  He presents for evaluation of multiple complaints including shakes, general malaise, shortness of breath, nausea without vomiting, generalized body aches including some chest pain and some abdominal pain, and a headache.  He is not having any hallucinations and reports that he has never had DTs or seizures when he stops drinking.  He reports that he went on a 6 or 7-day binge and then realized that he could not continue so he completely stopped drinking as of 4 AM yesterday (approximately 20 hours ago).  He did not feel ill until several hours after he stopped drinking, and then he started to feel all the symptoms as described above.  He has not had any fever of which she is aware.       Physical Exam   Triage Vital Signs: ED Triage Vitals  Enc Vitals Group     BP 05/15/22 0001 (!) 158/83     Pulse Rate 05/15/22 0001 92     Resp 05/15/22 0001 20     Temp --      Temp src --      SpO2 05/15/22 0001 100 %     Weight 05/14/22 2156 (!) 171.9 kg (379 lb)     Height 05/14/22 2156 1.854 m ('6\' 1"'$ )     Head Circumference --      Peak Flow --      Pain Score 05/14/22 2156 5     Pain Loc --      Pain Edu? --      Excl. in [place]? --     Most recent vital signs: Vitals:   05/15/22 0001  BP: (!) 158/83  Pulse: 92  Resp: 20  SpO2: 100%     General: Awake, ill-appearing but nontoxic.  Awake and alert. CV:  Good peripheral perfusion.  No tachycardia but heart rate hovering around 90.  Normal heart sounds. Resp:  Normal effort.  Lungs are clear to auscultation bilaterally.  No accessory muscle usage or intercostal retractions. Abd:  No distention.  Morbid obesity.  Mild generalized tenderness to palpation throughout the abdomen  with no focal tenderness and no peritonitis. Other:  Patient has a bilateral hand tremor at rest and with extension of his hands.  No asterixis.  No focal neurological deficits.  Mood and affect are normal and appropriate.   ED Results / Procedures / Treatments   Labs (all labs ordered are listed, but only abnormal results are displayed) Labs Reviewed  BASIC METABOLIC PANEL - Abnormal; Notable for the following components:      Result Value   Sodium 134 (*)    Glucose, Bld 103 (*)    Calcium 8.7 (*)    All other components within normal limits  CBC - Abnormal; Notable for the following components:   Platelets 94 (*)    All other components within normal limits  ETHANOL  TROPONIN I (HIGH SENSITIVITY)     EKG  ED ECG REPORT I, Hinda Kehr, the attending physician, personally viewed and interpreted this ECG.  Date: 05/14/2022 EKG Time: 22: 08 Rate: 77 Rhythm: normal sinus rhythm with sinus arrhythmia QRS Axis: normal Intervals: normal, QTc 468 ms ST/T Wave abnormalities:  normal Narrative Interpretation: no evidence of acute ischemia    RADIOLOGY I viewed and interpreted the patient's 1 view chest x-ray.  No evidence of pneumonia or pneumothorax or pulmonary edema.  Radiology report confirms no acute findings.    PROCEDURES:  Critical Care performed: No  Procedures   MEDICATIONS ORDERED IN ED: Medications  chlordiazePOXIDE (LIBRIUM) capsule 50 mg (has no administration in time range)  LORazepam (ATIVAN) tablet 2 mg (2 mg Oral Given 05/15/22 0051)  ondansetron (ZOFRAN-ODT) disintegrating tablet 4 mg (4 mg Oral Given 05/15/22 0050)  thiamine (VITAMIN B1) tablet 100 mg (100 mg Oral Given 05/15/22 0050)     IMPRESSION / MDM / ASSESSMENT AND PLAN / ED COURSE  I reviewed the triage vital signs and the nursing notes.                              Differential diagnosis includes, but is not limited to, alcohol withdrawal, complicated withdrawal, viral illness, ACS,  PE, pneumonia, electrolyte or metabolic abnormality, beer potomania, alcoholic ketoacidosis.  Patient's presentation is most consistent with acute presentation with potential threat to life or bodily function.  Lab/studies ordered: Basic metabolic panel, ethanol, CBC, high-sensitivity troponin, EKG, 1 view chest x-ray.  The patient is on the cardiac monitor to evaluate for evidence of arrhythmia and/or significant heart rate changes.  Interventions ordered: Ativan 2 mg p.o., thiamine 100 mg p.o., Zofran 4 mg ODT p.o.  Labs are reassuring.  No elevated anion gap, normal high-sensitivity troponin after many hours of feeling ill including some mild atypical chest pain.  No leukocytosis or anemia.  Negative ethanol level consistent with the history provided.  Normal chest x-ray as described above, no ischemia on EKG.  Symptoms are most consistent with alcohol withdrawal.  I calculated a CIWA score of approximately 14.  I ordered Ativan 2 mg p.o., thiamine 100 mg p.o., and Zofran 4 mg ODT and talked with the patient and his daughter about outpatient management with a prescription such as Librium.  They are willing to consider this.  I will reassess after the medications have a chance to work.   Clinical Course as of 05/15/22 0221  Fri May 15, 2022  0214 I reassessed the patient.  Vital signs are stable.  He is no longer tremulous.  He remains alert and oriented and acting appropriate.  I talked about outpatient management of his alcohol withdrawal and I will prescribe him a Librium taper.  I am giving him a first dose here.  Patient and daughter are comfortable with the plan and she will help him with follow-up experience as well as bring him back if he develops new or worsening symptoms. [CF]    Clinical Course User Index [CF] Hinda Kehr, MD     FINAL CLINICAL IMPRESSION(S) / ED DIAGNOSES   Final diagnoses:  Alcohol withdrawal syndrome without complication ([place])  Sleep apnea, unspecified  type     Rx / DC Orders   ED Discharge Orders          Ordered    chlordiazePOXIDE (LIBRIUM) 5 MG capsule        05/15/22 0217             Note:  This document was prepared using Dragon voice recognition software and may include unintentional dictation errors.   Hinda Kehr, MD 05/15/22 [phone]

## 2022-05-15 NOTE — Discharge Instructions (Addendum)
As we discussed, you need to take the prescribed Librium as written to help alleviate your alcohol withdrawal symptoms.  Follow-up with your regular doctor at the next available opportunity, both about the alcohol withdrawal issue and about your sleep apnea to see if you can get a better fitting CPAP mask.  We also provided resource guides to help with additional outpatient and residential substance abuse treatment; [facility] does not typically provide inpatient alcohol detox.    Return to the emergency department if you develop new or worsening symptoms that concern you.

## 2023-07-04 ENCOUNTER — Other Ambulatory Visit: Payer: Self-pay

## 2023-07-04 ENCOUNTER — Emergency Department
Admission: EM | Admit: 2023-07-04 | Discharge: 2023-07-05 | Disposition: A | Discharge status: Home / Self Care | Payer: MEDICAID | Attending: Emergency Medicine | Admitting: Emergency Medicine

## 2023-07-04 ENCOUNTER — Emergency Department: Payer: MEDICAID

## 2023-07-04 DIAGNOSIS — K4020 Bilateral inguinal hernia, without obstruction or gangrene, not specified as recurrent: Secondary | ICD-10-CM | POA: Insufficient documentation

## 2023-07-04 DIAGNOSIS — R1030 Lower abdominal pain, unspecified: Secondary | ICD-10-CM | POA: Diagnosis present

## 2023-07-04 LAB — COMPREHENSIVE METABOLIC PANEL
ALT: 22 U/L (ref 0–44)
AST: 41 U/L (ref 15–41)
Albumin: 3.4 g/dL — ABNORMAL LOW (ref 3.5–5.0)
Alkaline Phosphatase: 110 U/L (ref 38–126)
Anion gap: 5 (ref 5–15)
BUN: 10 mg/dL (ref 6–20)
CO2: 28 mmol/L (ref 22–32)
Calcium: 9.2 mg/dL (ref 8.9–10.3)
Chloride: 109 mmol/L (ref 98–111)
Creatinine, Ser: 0.95 mg/dL (ref 0.61–1.24)
GFR, Estimated: >60 mL/min (ref >60)
Glucose, Bld: 96 mg/dL (ref 70–99)
Potassium: 4.5 mmol/L (ref 3.5–5.1)
Sodium: 142 mmol/L (ref 135–145)
Total Bilirubin: 1.6 mg/dL — ABNORMAL HIGH (ref 0.0–1.2)
Total Protein: 7.3 g/dL (ref 6.5–8.1)

## 2023-07-04 LAB — CBC WITH DIFFERENTIAL/PLATELET
Abs Immature Granulocytes: 0.02 10*3/uL (ref 0.00–0.07)
Basophils Absolute: 0.1 10*3/uL (ref 0.0–0.1)
Basophils Relative: 1 %
Eosinophils Absolute: 0.2 10*3/uL (ref 0.0–0.5)
Eosinophils Relative: 4 %
HCT: 47.6 % (ref 39.0–52.0)
Hemoglobin: 16 g/dL (ref 13.0–17.0)
Immature Granulocytes: 0 %
Lymphocytes Relative: 45 %
Lymphs Abs: 3.1 10*3/uL (ref 0.7–4.0)
MCH: 34.7 pg — ABNORMAL HIGH (ref 26.0–34.0)
MCHC: 33.6 g/dL (ref 30.0–36.0)
MCV: 103.3 fL — ABNORMAL HIGH (ref 80.0–100.0)
Monocytes Absolute: 0.7 10*3/uL (ref 0.1–1.0)
Monocytes Relative: 10 %
Neutro Abs: 2.7 10*3/uL (ref 1.7–7.7)
Neutrophils Relative %: 40 %
Platelets: 115 10*3/uL — ABNORMAL LOW (ref 150–400)
RBC: 4.61 MIL/uL (ref 4.22–5.81)
RDW: 14.4 % (ref 11.5–15.5)
WBC: 6.7 10*3/uL (ref 4.0–10.5)
nRBC: 0 % (ref 0.0–0.2)

## 2023-07-04 NOTE — ED Provider Notes (Signed)
 [facility] Provider Note    Event Date/Time   First MD Initiated Contact with Patient 07/04/23 2301     (approximate)   History   Testicle Pain   HPI  [name] is a 58 y.o. male who presents to the ED for evaluation of Testicle Pain   Morbidly obese patient with history of umbilical hernia repair and remote appendectomy.  Patient presents to the ED due to 5 days of waxing and waning testicular pain, groin and lower abdominal pain.  Reports concern for testicular torsion as he has had this in the past.  Some nausea with the height of pain but no emesis.  Occasional blood in the stool.   Physical Exam   Triage Vital Signs: ED Triage Vitals  Encounter Vitals Group     BP 07/04/23 2203 (!) 146/79     Systolic BP Percentile --      Diastolic BP Percentile --      Pulse Rate 07/04/23 2203 67     Resp 07/04/23 2201 (!) 21     Temp 07/04/23 2203 98.5 F (36.9 C)     Temp Source 07/04/23 2203 Oral     SpO2 07/04/23 2203 100 %     Weight 07/04/23 2201 (!) 375 lb (170.1 kg)     Height 07/04/23 2201 6\' 2"  (1.88 m)     Head Circumference --      Peak Flow --      Pain Score 07/04/23 2201 2     Pain Loc --      Pain Education --      Exclude from Growth Chart --     Most recent vital signs: Vitals:   07/04/23 2201 07/04/23 2203  BP:  (!) 146/79  Pulse:  67  Resp: (!) 21   Temp:  98.5 F (36.9 C)  SpO2:  100%    General: Awake, no distress.  Morbidly obese, sitting upright on the edge of the bed, leaning forward and conversational.  Able to sit back so I can examine his abdomen but this causes discomfort CV:  Good peripheral perfusion.  Resp:  Normal effort.  Abd:  No distention.  Lower abdominal tenderness throughout, more pronounced on the right.  Benign upper abdomen.  No peritoneal features or guarding. MSK:  No deformity noted.  Neuro:  No focal deficits appreciated. Other:     ED Results / Procedures / Treatments    Labs (all labs ordered are listed, but only abnormal results are displayed) Labs Reviewed  CBC WITH DIFFERENTIAL/PLATELET - Abnormal; Notable for the following components:      Result Value   MCV 103.3 (*)    MCH 34.7 (*)    Platelets 115 (*)    All other components within normal limits  COMPREHENSIVE METABOLIC PANEL - Abnormal; Notable for the following components:   Albumin 3.4 (*)    Total Bilirubin 1.6 (*)    All other components within normal limits  URINALYSIS, ROUTINE W REFLEX MICROSCOPIC  SAMPLE TO BLOOD BANK    EKG   RADIOLOGY Scrotal ultrasound interpreted by me with normal testicles bilaterally  Official radiology report(s): CT ABDOMEN PELVIS W CONTRAST Result Date: 07/05/2023 CLINICAL DATA:  Evaluate right inguinal hernia. Right testicular pain. EXAM: CT ABDOMEN AND PELVIS WITH CONTRAST TECHNIQUE: Multidetector CT imaging of the abdomen and pelvis was performed using the standard protocol following bolus administration of intravenous contrast. RADIATION DOSE REDUCTION: This exam was performed according to the  departmental dose-optimization program which includes automated exposure control, adjustment of the mA and/or kV according to patient size and/or use of iterative reconstruction technique. CONTRAST:  OMNIPAQUE IOHEXOL 350 MG/ML SOLN COMPARISON:  None Available. FINDINGS: Lower chest: No acute abnormality. Hepatobiliary: The liver is enlarged. There is diffuse fatty infiltration. Gallbladder and bile ducts are within normal limits. Pancreas: Unremarkable. No pancreatic ductal dilatation or surrounding inflammatory changes. Spleen: Normal in size without focal abnormality. Adrenals/Urinary Tract: Adrenal glands are unremarkable. Kidneys are normal, without renal calculi, focal lesion, or hydronephrosis. Bladder is unremarkable. Stomach/Bowel: There is a small hiatal hernia. Stomach is within normal limits. Appendix is not seen. No evidence of bowel wall thickening,  distention, or inflammatory changes. Vascular/Lymphatic: Aorta and IVC are normal in size. No enlarged lymph nodes are seen. Reproductive: Prostate is unremarkable. Other: There is a moderate sized fat containing right inguinal hernia. There is a small fat containing left inguinal hernia. Anterior abdominal wall mesh noted at the umbilicus. No ascites. There is subcutaneous edema in the anterior lower abdominal wall without focal fluid collection. Musculoskeletal: No acute or significant osseous findings. IMPRESSION: 1. Moderate sized fat containing right inguinal hernia. 2. Small fat containing left inguinal hernia. 3. Hepatomegaly with fatty infiltration of the liver. 4. Small hiatal hernia. 5. Subcutaneous edema in the anterior lower abdominal wall without focal fluid collection. Correlate clinically for infection. Electronically Signed   By: Darliss Cheney M.D.   On: 07/05/2023 01:37   US SCROTUM W/DOPPLER Result Date: 07/04/2023 CLINICAL DATA:  Right testicular pain, history of testicular torsion EXAM: SCROTAL ULTRASOUND DOPPLER ULTRASOUND OF THE TESTICLES TECHNIQUE: Complete ultrasound examination of the testicles, epididymis, and other scrotal structures was performed. Color and spectral Doppler ultrasound were also utilized to evaluate blood flow to the testicles. COMPARISON:  05/19/2016 FINDINGS: Right testicle Measurements: 4.9 x 2.7 x 3.1 cm. Normal parenchymal echogenicity and echotexture. No intratesticular mass or calcification. Normal color flow vascularity. Left testicle Measurements: 4.3 x 2.8 x 3.5 cm. Normal parenchymal echogenicity and echotexture. No intratesticular mass or calcification. Normal color flow vascularity. Right epididymis:  Normal in size and appearance. Left epididymis:  Normal in size and appearance. Hydrocele:  None visualized. Varicocele:  None visualized. Pulsed Doppler interrogation of both testes demonstrates normal low resistance arterial and venous waveforms bilaterally.  Other: Bulbous mobile fat is seen within the right inguinal canal which may represent a cord lipoma or a indirect fat containing right hernia. This is fully assessed on this examination. IMPRESSION: 1. Normal testicular sonogram. No evidence of testicular torsion. 2. Bulbous mobile fat within the right inguinal canal possibly representing a cord lipoma or a indirect fat containing right hernia. This is fully assessed on this examination and could be better assessed with dedicated CT imaging if indicated. Electronically Signed   By: Helyn Numbers M.D.   On: 07/04/2023 23:16    PROCEDURES and INTERVENTIONS:  Procedures  Medications  ketorolac (TORADOL) 30 MG/ML injection 15 mg (15 mg Intravenous Given 07/05/23 0035)  iohexol (OMNIPAQUE) 350 MG/ML injection 100 mL (100 mLs Intravenous Contrast Given 07/05/23 0059)  oxyCODONE (Oxy IR/ROXICODONE) immediate release tablet 5 mg (5 mg Oral Given 07/05/23 0315)  acetaminophen (TYLENOL) tablet 1,000 mg (1,000 mg Oral Given 07/05/23 0315)     IMPRESSION / MDM / ASSESSMENT AND PLAN / ED COURSE  I reviewed the triage vital signs and the nursing notes.  Differential diagnosis includes, but is not limited to, testicular torsion, ureteral colic, inguinal hernia  {Patient presents with  symptoms of an acute illness or injury that is potentially life-threatening.  Patient presents with testicular pain that I suspect is related to an inguinal hernia.  Normal CBC and metabolic panel.  Ultrasound with normal testicles.  Does visualize a fat stranding in the inguinal canal is likely etiology of his symptoms.  We will add on a CT scan to assess for inguinal hernia, incarceration and SBO.  CT with evidence of fat-containing inguinal hernia.  No signs of obstruction or incarcerated bowel.  His pain is controlled and he is suitable for outpatient surgical follow-up.  Discharged with analgesia prescriptions, surgical referral and ED return precautions.  Clinical Course  as of 07/05/23 0452  Mon Jul 05, 2023  0305 Reassessed and discussed plan of care.  Discussed CT results.  He reports his pain is controlled and he is actually sitting back in the stretcher now and looks more comfortable than my initial evaluation.  I reassessed his abdomen and groin. [DS]  0449 Reassessed.  Pain is controlled.  Suitable for outpatient management with surgical follow-up.  Discussed care at home and return precautions [DS]    Clinical Course User Index [DS] Delton Prairie, MD     FINAL CLINICAL IMPRESSION(S) / ED DIAGNOSES   Final diagnoses:  Non-recurrent bilateral inguinal hernia without obstruction or gangrene     Rx / DC Orders   ED Discharge Orders          Ordered    oxyCODONE (ROXICODONE) 5 MG immediate release tablet  Every 8 hours PRN        07/05/23 0451             Note:  This document was prepared using Dragon voice recognition software and may include unintentional dictation errors.   Delton Prairie, MD 07/05/23 [phone]

## 2023-07-04 NOTE — ED Triage Notes (Signed)
 Pt arrives via [facility] from home with CC of R sided testicle pain that started 3-5 days ago. Pt also reports blood in urine and stool. Pt reports hx of torsion and reports this feels the same.

## 2023-07-04 NOTE — ED Notes (Signed)
Ultrasound  progress  

## 2023-07-04 NOTE — ED Notes (Addendum)
 Provided pt with a gown so he can disrobe for examination. Also provided warm blankets for comfort.

## 2023-07-05 ENCOUNTER — Emergency Department: Payer: MEDICAID

## 2023-07-05 LAB — SAMPLE TO BLOOD BANK

## 2023-07-05 MED ORDER — ACETAMINOPHEN 500 MG PO TABS
1000.0000 mg | ORAL_TABLET | Freq: Once | ORAL | Status: AC
  Administered 2023-07-05: 1000 mg via ORAL
  Filled 2023-07-05: qty 2

## 2023-07-05 MED ORDER — OXYCODONE HCL 5 MG PO TABS: 5.0000 mg | ORAL_TABLET | Freq: Three times a day (TID) | ORAL | 0 refills | Status: DC | PRN

## 2023-07-05 MED ORDER — OXYCODONE HCL 5 MG PO TABS
5.0000 mg | ORAL_TABLET | Freq: Once | ORAL | Status: AC
  Administered 2023-07-05: 5 mg via ORAL
  Filled 2023-07-05: qty 1

## 2023-07-05 MED ORDER — KETOROLAC TROMETHAMINE 30 MG/ML IJ SOLN
15.0000 mg | Freq: Once | INTRAMUSCULAR | Status: AC
  Administered 2023-07-05: 15 mg via INTRAVENOUS
  Filled 2023-07-05: qty 1

## 2023-07-05 MED ORDER — IOHEXOL 350 MG/ML SOLN
100.0000 mL | Freq: Once | INTRAVENOUS | Status: AC | PRN
  Administered 2023-07-05: 100 mL via INTRAVENOUS

## 2023-07-05 NOTE — Discharge Instructions (Signed)
 Please take Tylenol and ibuprofen/Advil for your pain.  It is safe to take them together, or to alternate them every few hours.  Take up to 1000mg  of Tylenol at a time, up to 4 times per day.  Do not take more than 4000 mg of Tylenol in 24 hours.  For ibuprofen, take 400-600 mg, 3 - 4 times per day.  Oxycodone as needed for more severe/breakthrough pain.   Reach out to the office of Dr. Tonna Boehringer to be seen and discuss the procedure to fix this.   If your symptoms worsen despite these measures then please return to the ED

## 2023-07-07 ENCOUNTER — Ambulatory Visit: Payer: Self-pay | Admitting: Surgery

## 2023-07-07 NOTE — H&P (Signed)
 SURGICAL PATHOLOGY SURGICAL PATHOLOGY [facility] [address], [address] [place], [place] [address] Telephone [phone] or [phone] Fax [phone]  REPORT OF SURGICAL PATHOLOGY   Accession #: [id number] Patient Name: [name] Visit # : [id number]  MRN: [id number] Physician: Sung Amabile DOB/Age 56/04/1941 (Age: 47) Gender: F Collected Date: 06/23/2023 Received Date: 06/23/2023  FINAL DIAGNOSIS       1. Cyst, excision, Left shoulder epidermal :      -  EPIDERMOID CYST.  NEGATIVE FOR ATYPIA OR MALIGNANCY.       DATE SIGNED OUT: 06/24/2023 ELECTRONIC SIGNATURE : [place] Md, Mark, Pathologist, Electronic Signature  MICROSCOPIC DESCRIPTION  CASE COMMENTS STAINS USED IN DIAGNOSIS: H&E    CLINICAL HISTORY  SPECIMEN(S) OBTAINED 1. Cyst, excision, Left Shoulder Epidermal  SPECIMEN COMMENTS: SPECIMEN CLINICAL INFORMATION:    Gross Description 1. "Epidermal cyst left shoulder", received fresh is a 5.3 x 4.5 x 4.0 cm disrupted cyst containing soft, white-gray, sebaceous material. The external surface the cyst has a scant amount of cautery and has focal white, bosselated areas; the internal wall is gray-white, smooth, and glistening and smooth without discrete papillary excrescences. Representative sections are submitted in 1 block (1A).      AMG 06/23/2023        Report signed out from the following location(s) [facility] [address] [place], [place] [address] CLIA #: [id number]  [facility] [address] [place], [place] [address] CLIA #: [id number]

## 2023-07-07 NOTE — H&P (View-Only) (Signed)
 SURGICAL PATHOLOGY SURGICAL PATHOLOGY [facility] [address], [address] [place], [place] [address] Telephone [phone] or [phone] Fax [phone]  REPORT OF SURGICAL PATHOLOGY   Accession #: [id number] Patient Name: [name] Visit # : [id number]  MRN: [id number] Physician: Sung Amabile DOB/Age 56/04/1941 (Age: 47) Gender: F Collected Date: 06/23/2023 Received Date: 06/23/2023  FINAL DIAGNOSIS       1. Cyst, excision, Left shoulder epidermal :      -  EPIDERMOID CYST.  NEGATIVE FOR ATYPIA OR MALIGNANCY.       DATE SIGNED OUT: 06/24/2023 ELECTRONIC SIGNATURE : [place] Md, Mark, Pathologist, Electronic Signature  MICROSCOPIC DESCRIPTION  CASE COMMENTS STAINS USED IN DIAGNOSIS: H&E    CLINICAL HISTORY  SPECIMEN(S) OBTAINED 1. Cyst, excision, Left Shoulder Epidermal  SPECIMEN COMMENTS: SPECIMEN CLINICAL INFORMATION:    Gross Description 1. "Epidermal cyst left shoulder", received fresh is a 5.3 x 4.5 x 4.0 cm disrupted cyst containing soft, white-gray, sebaceous material. The external surface the cyst has a scant amount of cautery and has focal white, bosselated areas; the internal wall is gray-white, smooth, and glistening and smooth without discrete papillary excrescences. Representative sections are submitted in 1 block (1A).      AMG 06/23/2023        Report signed out from the following location(s) [facility] [address], [place] [address] CLIA #: [id number]  [facility] [address] [place], [place] [address] CLIA #: [id number]

## 2023-07-09 ENCOUNTER — Other Ambulatory Visit: Payer: Self-pay

## 2023-07-09 ENCOUNTER — Encounter
Admission: RE | Admit: 2023-07-09 | Discharge: 2023-07-09 | Disposition: A | Discharge status: Home / Self Care | Source: Ambulatory Visit | Attending: Surgery | Admitting: Surgery

## 2023-07-09 VITALS — Ht 74.0 in | Wt 350.0 lb

## 2023-07-09 DIAGNOSIS — I10 Essential (primary) hypertension: Secondary | ICD-10-CM

## 2023-07-09 DIAGNOSIS — Z01810 Encounter for preprocedural cardiovascular examination: Secondary | ICD-10-CM

## 2023-07-09 NOTE — Patient Instructions (Signed)
 Your procedure is scheduled [id number] March 18  Report to the Registration Desk on the 1st floor of the [facility] To find out your arrival time, please call [phone] between 1PM - 3PM on: Monday March 17  If your arrival time is 6:00 am, do not arrive before that time as the [facility] entrance doors do not open until 6:00 am.  REMEMBER: Instructions that are not followed completely may result in serious medical risk, up to and including death; or upon the discretion of your surgeon and anesthesiologist your surgery may need to be rescheduled.  Do not eat food after midnight the night before surgery.  No gum chewing or hard candies.  You may however, drink CLEAR liquids up to 2 hours before you are scheduled to arrive for your surgery. Do not drink anything within 2 hours of your scheduled arrival time.  Clear liquids include: - water  - apple juice without pulp - gatorade (not RED colors) - black coffee or tea (Do NOT add milk or creamers to the coffee or tea) Do NOT drink anything that is not on this list.  One week prior to surgery: Starting Tuesday March 11  Stop Anti-inflammatories (NSAIDS) such as Advil, Aleve, Ibuprofen, Motrin, Naproxen, Naprosyn and Aspirin based products such as Excedrin, Goody's Powder, BC Powder. Stop ANY OVER THE COUNTER supplements until after surgery.  You may however, continue to take Tylenol if needed for pain up until the day of surgery.  Continue taking all of your other prescription medications up until the day of surgery.  ON THE DAY OF SURGERY DO NOT TAKE ANY MEDICATIONS  No Alcohol for 24 hours before or after surgery.  No Smoking including e-cigarettes for 24 hours before surgery.  No chewable tobacco products for at least 6 hours before surgery.  No nicotine patches on the day of surgery.  Do not use any "recreational" drugs for at least a week (preferably 2 weeks) before your surgery.  Please be advised that the combination  of cocaine and anesthesia may have negative outcomes, up to and including death. If you test positive for cocaine, your surgery will be cancelled.  On the morning of surgery brush your teeth with toothpaste and water, you may rinse your mouth with mouthwash if you wish. Do not swallow any toothpaste or mouthwash.  Use CHG Soap as directed on instruction sheet.  Do not wear jewelry, make-up, hairpins, clips or nail polish.  For welded (permanent) jewelry: bracelets, anklets, waist bands, etc.  Please have this removed prior to surgery.  If it is not removed, there is a chance that hospital personnel will need to cut it off on the day of surgery.  Do not wear lotions, powders, or perfumes.   Do not shave body hair from the neck down 48 hours before surgery.  Contact lenses, hearing aids and dentures may not be worn into surgery.  Do not bring valuables to the hospital. [facility] is not responsible for any missing/lost belongings or valuables.   Notify your doctor if there is any change in your medical condition (cold, fever, infection).  Wear comfortable clothing (specific to your surgery type) to the hospital.  After surgery, you can help prevent lung complications by doing breathing exercises.  Take deep breaths and cough every 1-2 hours. Your doctor may order a device called an Incentive Spirometer to help you take deep breaths.  When coughing or sneezing, hold a pillow firmly against your incision with both hands. This  is called "splinting." Doing this helps protect your incision. It also decreases belly discomfort.   If you are being discharged the day of surgery, you will not be allowed to drive home. You will need a responsible individual to drive you home and stay with you for 24 hours after surgery.   If you are taking public transportation, you will need to have a responsible individual with you.  Please call the Pre-admissions Testing Dept. at [phone] if you have  any questions about these instructions.  Surgery Visitation Policy:  Patients having surgery or a procedure may have two visitors.  Children under the age of 61 must have an adult with them who is not the patient.  Temporary Visitor Restrictions Due to increasing cases of flu, RSV and COVID-19: Children ages 61 and under will not be able to visit patients in [facility] hospitals under most circumstances.       Preparing for Surgery with CHLORHEXIDINE GLUCONATE (CHG) Soap  Chlorhexidine Gluconate (CHG) Soap  o An antiseptic cleaner that kills germs and bonds with the skin to continue killing germs even after washing  o Used for showering the night before surgery and morning of surgery  Before surgery, you can play an important role by reducing the number of germs on your skin.  CHG (Chlorhexidine gluconate) soap is an antiseptic cleanser which kills germs and bonds with the skin to continue killing germs even after washing.  Please do not use if you have an allergy to CHG or antibacterial soaps. If your skin becomes reddened/irritated stop using the CHG.  1. Shower the NIGHT BEFORE SURGERY and the MORNING OF SURGERY with CHG soap.  2. If you choose to wash your hair, wash your hair first as usual with your normal shampoo.  3. After shampooing, rinse your hair and body thoroughly to remove the shampoo.  4. Use CHG as you would any other liquid soap. You can apply CHG directly to the skin and wash gently with a scrungie or a clean washcloth.  5. Apply the CHG soap to your body only from the neck down. Do not use on open wounds or open sores. Avoid contact with your eyes, ears, mouth, and genitals (private parts). Wash face and genitals (private parts) with your normal soap.  6. Wash thoroughly, paying special attention to the area where your surgery will be performed.  7. Thoroughly rinse your body with warm water.  8. Do not shower/wash with your normal soap after using and  rinsing off the CHG soap.  9. Pat yourself dry with a clean towel.  10. Wear clean pajamas to bed the night before surgery.  12. Place clean sheets on your bed the night of your first shower and do not sleep with pets.  13. Shower again with the CHG soap on the day of surgery prior to arriving at the hospital.  14. Do not apply any deodorants/lotions/powders.  15. Please wear clean clothes to the hospital.

## 2023-07-12 ENCOUNTER — Encounter
Admission: RE | Admit: 2023-07-12 | Discharge: 2023-07-12 | Disposition: A | Discharge status: Home / Self Care | Payer: MEDICAID | Source: Ambulatory Visit | Attending: Surgery | Admitting: Surgery

## 2023-07-12 DIAGNOSIS — Z01810 Encounter for preprocedural cardiovascular examination: Secondary | ICD-10-CM | POA: Diagnosis not present

## 2023-07-12 DIAGNOSIS — I10 Essential (primary) hypertension: Secondary | ICD-10-CM | POA: Insufficient documentation

## 2023-07-13 ENCOUNTER — Encounter: Payer: Self-pay | Admitting: Surgery

## 2023-07-13 ENCOUNTER — Inpatient Hospital Stay
Admission: RE | Admit: 2023-07-13 | Discharge: 2023-08-05 | DRG: 981 | Disposition: A | Discharge status: Home / Self Care | Payer: MEDICAID | Attending: Internal Medicine | Admitting: Internal Medicine

## 2023-07-13 ENCOUNTER — Inpatient Hospital Stay: Payer: MEDICAID

## 2023-07-13 ENCOUNTER — Observation Stay: Payer: MEDICAID

## 2023-07-13 ENCOUNTER — Encounter
Admission: RE | Disposition: A | Discharge status: Home / Self Care | Payer: Self-pay | Source: Home / Self Care | Attending: Internal Medicine

## 2023-07-13 ENCOUNTER — Other Ambulatory Visit: Payer: Self-pay

## 2023-07-13 ENCOUNTER — Ambulatory Visit: Payer: MEDICAID | Admitting: General Practice

## 2023-07-13 DIAGNOSIS — R635 Abnormal weight gain: Secondary | ICD-10-CM | POA: Insufficient documentation

## 2023-07-13 DIAGNOSIS — Z7901 Long term (current) use of anticoagulants: Secondary | ICD-10-CM

## 2023-07-13 DIAGNOSIS — Y848 Other medical procedures as the cause of abnormal reaction of the patient, or of later complication, without mention of misadventure at the time of the procedure: Secondary | ICD-10-CM | POA: Diagnosis present

## 2023-07-13 DIAGNOSIS — T8051XA Anaphylactic reaction due to administration of blood and blood products, initial encounter: Secondary | ICD-10-CM | POA: Diagnosis present

## 2023-07-13 DIAGNOSIS — J9584 Transfusion-related acute lung injury (TRALI): Principal | ICD-10-CM | POA: Diagnosis present

## 2023-07-13 DIAGNOSIS — F1011 Alcohol abuse, in remission: Secondary | ICD-10-CM | POA: Insufficient documentation

## 2023-07-13 DIAGNOSIS — R509 Fever, unspecified: Secondary | ICD-10-CM

## 2023-07-13 DIAGNOSIS — K4020 Bilateral inguinal hernia, without obstruction or gangrene, not specified as recurrent: Secondary | ICD-10-CM | POA: Diagnosis present

## 2023-07-13 DIAGNOSIS — E66813 Obesity, class 3: Secondary | ICD-10-CM | POA: Diagnosis present

## 2023-07-13 DIAGNOSIS — I82621 Acute embolism and thrombosis of deep veins of right upper extremity: Secondary | ICD-10-CM | POA: Diagnosis not present

## 2023-07-13 DIAGNOSIS — T782XXA Anaphylactic shock, unspecified, initial encounter: Secondary | ICD-10-CM | POA: Insufficient documentation

## 2023-07-13 DIAGNOSIS — J189 Pneumonia, unspecified organism: Secondary | ICD-10-CM | POA: Diagnosis not present

## 2023-07-13 DIAGNOSIS — Z8249 Family history of ischemic heart disease and other diseases of the circulatory system: Secondary | ICD-10-CM

## 2023-07-13 DIAGNOSIS — A419 Sepsis, unspecified organism: Secondary | ICD-10-CM | POA: Diagnosis not present

## 2023-07-13 DIAGNOSIS — Z6841 Body Mass Index (BMI) 40.0 and over, adult: Secondary | ICD-10-CM | POA: Diagnosis not present

## 2023-07-13 DIAGNOSIS — F32A Depression, unspecified: Secondary | ICD-10-CM | POA: Diagnosis present

## 2023-07-13 DIAGNOSIS — M7989 Other specified soft tissue disorders: Secondary | ICD-10-CM | POA: Insufficient documentation

## 2023-07-13 DIAGNOSIS — R6521 Severe sepsis with septic shock: Secondary | ICD-10-CM | POA: Diagnosis not present

## 2023-07-13 DIAGNOSIS — E871 Hypo-osmolality and hyponatremia: Secondary | ICD-10-CM | POA: Diagnosis not present

## 2023-07-13 DIAGNOSIS — E8770 Fluid overload, unspecified: Secondary | ICD-10-CM | POA: Diagnosis not present

## 2023-07-13 DIAGNOSIS — Z86718 Personal history of other venous thrombosis and embolism: Secondary | ICD-10-CM

## 2023-07-13 DIAGNOSIS — J9602 Acute respiratory failure with hypercapnia: Secondary | ICD-10-CM | POA: Diagnosis present

## 2023-07-13 DIAGNOSIS — E873 Alkalosis: Secondary | ICD-10-CM | POA: Diagnosis not present

## 2023-07-13 DIAGNOSIS — N179 Acute kidney failure, unspecified: Secondary | ICD-10-CM | POA: Diagnosis not present

## 2023-07-13 DIAGNOSIS — K660 Peritoneal adhesions (postprocedural) (postinfection): Secondary | ICD-10-CM | POA: Diagnosis present

## 2023-07-13 DIAGNOSIS — D696 Thrombocytopenia, unspecified: Secondary | ICD-10-CM | POA: Diagnosis not present

## 2023-07-13 DIAGNOSIS — K439 Ventral hernia without obstruction or gangrene: Secondary | ICD-10-CM | POA: Diagnosis not present

## 2023-07-13 DIAGNOSIS — E875 Hyperkalemia: Secondary | ICD-10-CM | POA: Diagnosis not present

## 2023-07-13 DIAGNOSIS — Z5331 Laparoscopic surgical procedure converted to open procedure: Secondary | ICD-10-CM

## 2023-07-13 DIAGNOSIS — G4733 Obstructive sleep apnea (adult) (pediatric): Secondary | ICD-10-CM | POA: Diagnosis present

## 2023-07-13 DIAGNOSIS — F1010 Alcohol abuse, uncomplicated: Secondary | ICD-10-CM | POA: Diagnosis present

## 2023-07-13 DIAGNOSIS — K219 Gastro-esophageal reflux disease without esophagitis: Secondary | ICD-10-CM | POA: Diagnosis present

## 2023-07-13 DIAGNOSIS — J690 Pneumonitis due to inhalation of food and vomit: Secondary | ICD-10-CM | POA: Diagnosis not present

## 2023-07-13 DIAGNOSIS — K567 Ileus, unspecified: Secondary | ICD-10-CM | POA: Diagnosis not present

## 2023-07-13 DIAGNOSIS — M7981 Nontraumatic hematoma of soft tissue: Secondary | ICD-10-CM | POA: Diagnosis present

## 2023-07-13 DIAGNOSIS — Z803 Family history of malignant neoplasm of breast: Secondary | ICD-10-CM

## 2023-07-13 DIAGNOSIS — N390 Urinary tract infection, site not specified: Secondary | ICD-10-CM | POA: Diagnosis present

## 2023-07-13 DIAGNOSIS — I10 Essential (primary) hypertension: Secondary | ICD-10-CM | POA: Diagnosis present

## 2023-07-13 DIAGNOSIS — Z5309 Procedure and treatment not carried out because of other contraindication: Secondary | ICD-10-CM | POA: Diagnosis present

## 2023-07-13 DIAGNOSIS — Z83438 Family history of other disorder of lipoprotein metabolism and other lipidemia: Secondary | ICD-10-CM

## 2023-07-13 DIAGNOSIS — J9601 Acute respiratory failure with hypoxia: Secondary | ICD-10-CM | POA: Diagnosis present

## 2023-07-13 DIAGNOSIS — R531 Weakness: Secondary | ICD-10-CM

## 2023-07-13 DIAGNOSIS — K9189 Other postprocedural complications and disorders of digestive system: Secondary | ICD-10-CM | POA: Diagnosis not present

## 2023-07-13 DIAGNOSIS — K9162 Intraoperative hemorrhage and hematoma of a digestive system organ or structure complicating other procedure: Secondary | ICD-10-CM | POA: Diagnosis present

## 2023-07-13 DIAGNOSIS — Z882 Allergy status to sulfonamides status: Secondary | ICD-10-CM

## 2023-07-13 DIAGNOSIS — T81321A Disruption or dehiscence of closure of internal operation (surgical) wound of abdominal wall muscle or fascia, initial encounter: Secondary | ICD-10-CM | POA: Diagnosis not present

## 2023-07-13 DIAGNOSIS — Z800 Family history of malignant neoplasm of digestive organs: Secondary | ICD-10-CM

## 2023-07-13 DIAGNOSIS — Z79899 Other long term (current) drug therapy: Secondary | ICD-10-CM

## 2023-07-13 DIAGNOSIS — T458X5A Adverse effect of other primarily systemic and hematological agents, initial encounter: Secondary | ICD-10-CM | POA: Diagnosis present

## 2023-07-13 DIAGNOSIS — N400 Enlarged prostate without lower urinary tract symptoms: Secondary | ICD-10-CM | POA: Diagnosis present

## 2023-07-13 DIAGNOSIS — R21 Rash and other nonspecific skin eruption: Secondary | ICD-10-CM | POA: Diagnosis not present

## 2023-07-13 DIAGNOSIS — Z8042 Family history of malignant neoplasm of prostate: Secondary | ICD-10-CM

## 2023-07-13 DIAGNOSIS — T81321S Disruption or dehiscence of closure of internal operation (surgical) wound of abdominal wall muscle or fascia, sequela: Secondary | ICD-10-CM | POA: Diagnosis not present

## 2023-07-13 DIAGNOSIS — E876 Hypokalemia: Secondary | ICD-10-CM | POA: Diagnosis not present

## 2023-07-13 DIAGNOSIS — J4520 Mild intermittent asthma, uncomplicated: Secondary | ICD-10-CM | POA: Diagnosis present

## 2023-07-13 DIAGNOSIS — Z8261 Family history of arthritis: Secondary | ICD-10-CM

## 2023-07-13 DIAGNOSIS — I509 Heart failure, unspecified: Secondary | ICD-10-CM | POA: Diagnosis not present

## 2023-07-13 DIAGNOSIS — K4090 Unilateral inguinal hernia, without obstruction or gangrene, not specified as recurrent: Secondary | ICD-10-CM

## 2023-07-13 DIAGNOSIS — Z8041 Family history of malignant neoplasm of ovary: Secondary | ICD-10-CM

## 2023-07-13 DIAGNOSIS — G4700 Insomnia, unspecified: Secondary | ICD-10-CM | POA: Diagnosis present

## 2023-07-13 DIAGNOSIS — F17210 Nicotine dependence, cigarettes, uncomplicated: Secondary | ICD-10-CM | POA: Diagnosis present

## 2023-07-13 HISTORY — PX: LAPAROTOMY: SHX154

## 2023-07-13 LAB — CBC
HCT: 39.4 % (ref 39.0–52.0)
HCT: 42.6 % (ref 39.0–52.0)
Hemoglobin: 13.7 g/dL (ref 13.0–17.0)
Hemoglobin: 14.7 g/dL (ref 13.0–17.0)
MCH: 35.6 pg — ABNORMAL HIGH (ref 26.0–34.0)
MCH: 34.7 pg — ABNORMAL HIGH (ref 26.0–34.0)
MCHC: 34.8 g/dL (ref 30.0–36.0)
MCHC: 34.5 g/dL (ref 30.0–36.0)
MCV: 102.3 fL — ABNORMAL HIGH (ref 80.0–100.0)
MCV: 100.5 fL — ABNORMAL HIGH (ref 80.0–100.0)
Platelets: 94 10*3/uL — ABNORMAL LOW (ref 150–400)
Platelets: 142 10*3/uL — ABNORMAL LOW (ref 150–400)
RBC: 3.85 MIL/uL — ABNORMAL LOW (ref 4.22–5.81)
RBC: 4.24 MIL/uL (ref 4.22–5.81)
RDW: 14.2 % (ref 11.5–15.5)
RDW: 14.3 % (ref 11.5–15.5)
WBC: 8.5 10*3/uL (ref 4.0–10.5)
WBC: 5.7 10*3/uL (ref 4.0–10.5)
nRBC: 0 % (ref 0.0–0.2)
nRBC: 0 % (ref 0.0–0.2)

## 2023-07-13 LAB — GLUCOSE, CAPILLARY: Glucose-Capillary: 157 mg/dL — ABNORMAL HIGH (ref 70–99)

## 2023-07-13 LAB — BLOOD GAS, ARTERIAL
Acid-base deficit: 2.6 mmol/L — ABNORMAL HIGH (ref 0.0–2.0)
Bicarbonate: 21.8 mmol/L (ref 20.0–28.0)
FIO2: 0.36 %
O2 Saturation: 99.7 %
Patient temperature: 37
pCO2 arterial: 36 mmHg (ref 32–48)
pH, Arterial: 7.39 (ref 7.35–7.45)
pO2, Arterial: 81 mmHg — ABNORMAL LOW (ref 83–108)

## 2023-07-13 LAB — URINALYSIS, W/ REFLEX TO CULTURE (INFECTION SUSPECTED)
Bacteria, UA: NONE SEEN
Bilirubin Urine: NEGATIVE
Glucose, UA: NEGATIVE mg/dL
Ketones, ur: NEGATIVE mg/dL
Nitrite: NEGATIVE
Protein, ur: 30 mg/dL — AB
Specific Gravity, Urine: 1.026 (ref 1.005–1.030)
WBC, UA: >50 WBC/hpf (ref 0–5)
pH: 5 (ref 5.0–8.0)

## 2023-07-13 LAB — URINALYSIS, COMPLETE (UACMP) WITH MICROSCOPIC
Bilirubin Urine: NEGATIVE
Glucose, UA: NEGATIVE mg/dL
Ketones, ur: NEGATIVE mg/dL
Nitrite: NEGATIVE
Protein, ur: 30 mg/dL — AB
Specific Gravity, Urine: 1.026 (ref 1.005–1.030)
WBC, UA: >50 WBC/hpf (ref 0–5)
pH: 5 (ref 5.0–8.0)

## 2023-07-13 LAB — COMPREHENSIVE METABOLIC PANEL
ALT: 17 U/L (ref 0–44)
AST: 33 U/L (ref 15–41)
Albumin: 2.7 g/dL — ABNORMAL LOW (ref 3.5–5.0)
Alkaline Phosphatase: 79 U/L (ref 38–126)
Anion gap: 7 (ref 5–15)
BUN: 16 mg/dL (ref 6–20)
CO2: 22 mmol/L (ref 22–32)
Calcium: 8.6 mg/dL — ABNORMAL LOW (ref 8.9–10.3)
Chloride: 108 mmol/L (ref 98–111)
Creatinine, Ser: 1.1 mg/dL (ref 0.61–1.24)
GFR, Estimated: >60 mL/min (ref >60)
Glucose, Bld: 155 mg/dL — ABNORMAL HIGH (ref 70–99)
Potassium: 4.5 mmol/L (ref 3.5–5.1)
Sodium: 137 mmol/L (ref 135–145)
Total Bilirubin: 2 mg/dL — ABNORMAL HIGH (ref 0.0–1.2)
Total Protein: 5.9 g/dL — ABNORMAL LOW (ref 6.5–8.1)

## 2023-07-13 LAB — CBC WITH DIFFERENTIAL/PLATELET
Abs Immature Granulocytes: 0.06 10*3/uL (ref 0.00–0.07)
Basophils Absolute: 0 10*3/uL (ref 0.0–0.1)
Basophils Relative: 0 %
Eosinophils Absolute: 0 10*3/uL (ref 0.0–0.5)
Eosinophils Relative: 0 %
HCT: 41.4 % (ref 39.0–52.0)
Hemoglobin: 14.5 g/dL (ref 13.0–17.0)
Immature Granulocytes: 0 %
Lymphocytes Relative: 4 %
Lymphs Abs: 0.6 10*3/uL — ABNORMAL LOW (ref 0.7–4.0)
MCH: 35.5 pg — ABNORMAL HIGH (ref 26.0–34.0)
MCHC: 35 g/dL (ref 30.0–36.0)
MCV: 101.5 fL — ABNORMAL HIGH (ref 80.0–100.0)
Monocytes Absolute: 1 10*3/uL (ref 0.1–1.0)
Monocytes Relative: 6 %
Neutro Abs: 13.9 10*3/uL — ABNORMAL HIGH (ref 1.7–7.7)
Neutrophils Relative %: 90 %
Platelets: 147 10*3/uL — ABNORMAL LOW (ref 150–400)
RBC: 4.08 MIL/uL — ABNORMAL LOW (ref 4.22–5.81)
RDW: 14.3 % (ref 11.5–15.5)
WBC: 15.5 10*3/uL — ABNORMAL HIGH (ref 4.0–10.5)
nRBC: 0 % (ref 0.0–0.2)

## 2023-07-13 LAB — HEMOGLOBIN AND HEMATOCRIT, BLOOD
HCT: 40 % (ref 39.0–52.0)
Hemoglobin: 13.9 g/dL (ref 13.0–17.0)

## 2023-07-13 LAB — PROTIME-INR
INR: 1.4 — ABNORMAL HIGH (ref 0.8–1.2)
Prothrombin Time: 17.6 s — ABNORMAL HIGH (ref 11.4–15.2)

## 2023-07-13 LAB — BRAIN NATRIURETIC PEPTIDE: B Natriuretic Peptide: 18.1 pg/mL (ref 0.0–100.0)

## 2023-07-13 LAB — TROPONIN I (HIGH SENSITIVITY): Troponin I (High Sensitivity): 24 ng/L — ABNORMAL HIGH (ref <18)

## 2023-07-13 LAB — LACTATE DEHYDROGENASE: LDH: 167 U/L (ref 98–192)

## 2023-07-13 LAB — TYPE AND SCREEN
ABO/RH(D): O POS
Antibody Screen: NEGATIVE

## 2023-07-13 LAB — ABO/RH: ABO/RH(D): O POS

## 2023-07-13 SURGERY — LAPAROTOMY, EXPLORATORY
Anesthesia: General

## 2023-07-13 MED ORDER — FENTANYL CITRATE (PF) 100 MCG/2ML IJ SOLN
25.0000 ug | INTRAMUSCULAR | Status: AC | PRN
  Administered 2023-07-13 (×2): 25 ug via INTRAVENOUS
  Administered 2023-07-13: 50 ug via INTRAVENOUS
  Administered 2023-07-13 (×2): 25 ug via INTRAVENOUS

## 2023-07-13 MED ORDER — ONDANSETRON HCL 4 MG/2ML IJ SOLN
INTRAMUSCULAR | Status: DC | PRN
  Administered 2023-07-13: 4 mg via INTRAVENOUS

## 2023-07-13 MED ORDER — FENTANYL CITRATE (PF) 100 MCG/2ML IJ SOLN
INTRAMUSCULAR | Status: AC
  Filled 2023-07-13: qty 2

## 2023-07-13 MED ORDER — LACTATED RINGERS IV SOLN: INTRAVENOUS | Status: DC

## 2023-07-13 MED ORDER — TRAMADOL HCL 50 MG PO TABS
50.0000 mg | ORAL_TABLET | Freq: Four times a day (QID) | ORAL | Status: DC | PRN
  Administered 2023-07-14 – 2023-07-16 (×6): 50 mg via ORAL
  Filled 2023-07-13 (×6): qty 1

## 2023-07-13 MED ORDER — NOREPINEPHRINE 4 MG/250ML-% IV SOLN
INTRAVENOUS | Status: AC
Start: 2023-07-13 — End: 2023-07-13
  Filled 2023-07-13: qty 250

## 2023-07-13 MED ORDER — ALBUMIN HUMAN 25 % IV SOLN
12.5000 g | Freq: Once | INTRAVENOUS | Status: AC
  Administered 2023-07-13: 12.5 g via INTRAVENOUS

## 2023-07-13 MED ORDER — DEXAMETHASONE SODIUM PHOSPHATE 10 MG/ML IJ SOLN
INTRAMUSCULAR | Status: AC
  Filled 2023-07-13: qty 1

## 2023-07-13 MED ORDER — HYDROMORPHONE HCL 1 MG/ML IJ SOLN
0.5000 mg | INTRAMUSCULAR | Status: DC | PRN
  Administered 2023-07-13 (×2): 0.5 mg via INTRAVENOUS

## 2023-07-13 MED ORDER — CELECOXIB 200 MG PO CAPS
200.0000 mg | ORAL_CAPSULE | ORAL | Status: AC
  Administered 2023-07-13: 200 mg via ORAL

## 2023-07-13 MED ORDER — SODIUM CHLORIDE 0.9% FLUSH: 10.0000 mL | INTRAVENOUS | Status: DC | PRN

## 2023-07-13 MED ORDER — FENTANYL CITRATE PF 50 MCG/ML IJ SOSY
25.0000 ug | PREFILLED_SYRINGE | Freq: Once | INTRAMUSCULAR | Status: AC
  Administered 2023-07-13: 25 ug via INTRAVENOUS

## 2023-07-13 MED ORDER — DIPHENHYDRAMINE HCL 50 MG/ML IJ SOLN: 25.0000 mg | Freq: Once | INTRAMUSCULAR | Status: DC

## 2023-07-13 MED ORDER — METHYLPREDNISOLONE SODIUM SUCC 125 MG IJ SOLR
125.0000 mg | Freq: Once | INTRAMUSCULAR | Status: AC
  Administered 2023-07-13: 125 mg via INTRAVENOUS
  Filled 2023-07-13: qty 2

## 2023-07-13 MED ORDER — BUPIVACAINE LIPOSOME 1.3 % IJ SUSP
INTRAMUSCULAR | Status: AC
  Filled 2023-07-13: qty 20

## 2023-07-13 MED ORDER — KETAMINE HCL 50 MG/5ML IJ SOSY
PREFILLED_SYRINGE | INTRAMUSCULAR | Status: DC | PRN
  Administered 2023-07-13 (×2): 20 mg via INTRAVENOUS
  Administered 2023-07-13: 10 mg via INTRAVENOUS

## 2023-07-13 MED ORDER — GABAPENTIN 300 MG PO CAPS
300.0000 mg | ORAL_CAPSULE | ORAL | Status: AC
  Administered 2023-07-13: 300 mg via ORAL

## 2023-07-13 MED ORDER — FENTANYL CITRATE PF 50 MCG/ML IJ SOSY
PREFILLED_SYRINGE | INTRAMUSCULAR | Status: AC
  Filled 2023-07-13: qty 1

## 2023-07-13 MED ORDER — DOCUSATE SODIUM 100 MG PO CAPS: 100.0000 mg | ORAL_CAPSULE | Freq: Two times a day (BID) | ORAL | Status: DC | PRN

## 2023-07-13 MED ORDER — ROCURONIUM BROMIDE 100 MG/10ML IV SOLN
INTRAVENOUS | Status: DC | PRN
  Administered 2023-07-13 (×2): 20 mg via INTRAVENOUS
  Administered 2023-07-13: 60 mg via INTRAVENOUS
  Administered 2023-07-13: 20 mg via INTRAVENOUS

## 2023-07-13 MED ORDER — DIPHENHYDRAMINE HCL 50 MG/ML IJ SOLN: 25.0000 mg | Freq: Four times a day (QID) | INTRAMUSCULAR | Status: DC | PRN

## 2023-07-13 MED ORDER — ALBUMIN HUMAN 5 % IV SOLN: INTRAVENOUS | Status: DC | PRN

## 2023-07-13 MED ORDER — CHLORHEXIDINE GLUCONATE CLOTH 2 % EX PADS
6.0000 | MEDICATED_PAD | Freq: Once | CUTANEOUS | Status: DC
  Administered 2023-07-13: 6 via TOPICAL

## 2023-07-13 MED ORDER — DIPHENHYDRAMINE HCL 50 MG/ML IJ SOLN
INTRAMUSCULAR | Status: AC
  Filled 2023-07-13: qty 1

## 2023-07-13 MED ORDER — FENTANYL CITRATE (PF) 100 MCG/2ML IJ SOLN
INTRAMUSCULAR | Status: DC | PRN
  Administered 2023-07-13: 50 ug via INTRAVENOUS
  Administered 2023-07-13: 100 ug via INTRAVENOUS
  Administered 2023-07-13: 50 ug via INTRAVENOUS

## 2023-07-13 MED ORDER — PHENYLEPHRINE 80 MCG/ML (10ML) SYRINGE FOR IV PUSH (FOR BLOOD PRESSURE SUPPORT)
PREFILLED_SYRINGE | INTRAVENOUS | Status: DC | PRN
  Administered 2023-07-13: 160 ug via INTRAVENOUS
  Administered 2023-07-13 (×3): 80 ug via INTRAVENOUS

## 2023-07-13 MED ORDER — ONDANSETRON 4 MG PO TBDP: 4.0000 mg | ORAL_TABLET | Freq: Four times a day (QID) | ORAL | Status: DC | PRN

## 2023-07-13 MED ORDER — CHLORHEXIDINE GLUCONATE CLOTH 2 % EX PADS
6.0000 | MEDICATED_PAD | Freq: Every day | CUTANEOUS | Status: DC
  Administered 2023-07-14 – 2023-07-25 (×11): 6 via TOPICAL

## 2023-07-13 MED ORDER — OXYCODONE HCL 5 MG/5ML PO SOLN: 5.0000 mg | Freq: Once | ORAL | Status: DC | PRN

## 2023-07-13 MED ORDER — ONDANSETRON HCL 4 MG/2ML IJ SOLN
4.0000 mg | Freq: Four times a day (QID) | INTRAMUSCULAR | Status: DC | PRN
Start: 2023-07-13 — End: 2023-08-05
  Administered 2023-07-13 – 2023-07-20 (×4): 4 mg via INTRAVENOUS
  Filled 2023-07-13 (×4): qty 2

## 2023-07-13 MED ORDER — PROPOFOL 10 MG/ML IV BOLUS
INTRAVENOUS | Status: AC
  Filled 2023-07-13: qty 20

## 2023-07-13 MED ORDER — FUROSEMIDE 10 MG/ML IJ SOLN
INTRAMUSCULAR | Status: AC
  Filled 2023-07-13: qty 4

## 2023-07-13 MED ORDER — SODIUM CHLORIDE 0.9% FLUSH
10.0000 mL | Freq: Two times a day (BID) | INTRAVENOUS | Status: DC
  Administered 2023-07-13 – 2023-07-21 (×16): 10 mL
  Administered 2023-07-21: 20 mL
  Administered 2023-07-22 – 2023-07-24 (×6): 10 mL

## 2023-07-13 MED ORDER — MIDAZOLAM HCL 2 MG/2ML IJ SOLN
INTRAMUSCULAR | Status: AC
  Filled 2023-07-13: qty 2

## 2023-07-13 MED ORDER — ACETAMINOPHEN 500 MG PO TABS
ORAL_TABLET | ORAL | Status: AC
  Filled 2023-07-13: qty 2

## 2023-07-13 MED ORDER — ORAL CARE MOUTH RINSE: 15.0000 mL | Freq: Once | OROMUCOSAL | Status: AC

## 2023-07-13 MED ORDER — NOREPINEPHRINE 4 MG/250ML-% IV SOLN
2.0000 ug/min | INTRAVENOUS | Status: DC
  Administered 2023-07-13: 2 ug/min via INTRAVENOUS
  Filled 2023-07-13: qty 250

## 2023-07-13 MED ORDER — DEXMEDETOMIDINE HCL IN NACL 80 MCG/20ML IV SOLN
INTRAVENOUS | Status: DC | PRN
  Administered 2023-07-13 (×2): 8 ug via INTRAVENOUS

## 2023-07-13 MED ORDER — SODIUM CHLORIDE 0.9% IV SOLUTION: Freq: Once | INTRAVENOUS | Status: DC

## 2023-07-13 MED ORDER — CHLORHEXIDINE GLUCONATE 0.12 % MT SOLN
OROMUCOSAL | Status: AC
  Filled 2023-07-13: qty 15

## 2023-07-13 MED ORDER — ONDANSETRON HCL 4 MG/2ML IJ SOLN
INTRAMUSCULAR | Status: AC
  Filled 2023-07-13: qty 2

## 2023-07-13 MED ORDER — LIDOCAINE HCL (CARDIAC) PF 100 MG/5ML IV SOSY
PREFILLED_SYRINGE | INTRAVENOUS | Status: DC | PRN
  Administered 2023-07-13: 80 mg via INTRAVENOUS

## 2023-07-13 MED ORDER — SODIUM CHLORIDE 0.9 % IV SOLN: 1.0000 g | INTRAVENOUS | Status: DC

## 2023-07-13 MED ORDER — BUPIVACAINE-EPINEPHRINE (PF) 0.5% -1:200000 IJ SOLN
INTRAMUSCULAR | Status: AC
  Filled 2023-07-13: qty 30

## 2023-07-13 MED ORDER — GABAPENTIN 300 MG PO CAPS
ORAL_CAPSULE | ORAL | Status: AC
  Filled 2023-07-13: qty 1

## 2023-07-13 MED ORDER — ROCURONIUM BROMIDE 10 MG/ML (PF) SYRINGE
PREFILLED_SYRINGE | INTRAVENOUS | Status: AC
  Filled 2023-07-13: qty 10

## 2023-07-13 MED ORDER — FAMOTIDINE IN NACL 20-0.9 MG/50ML-% IV SOLN
20.0000 mg | Freq: Two times a day (BID) | INTRAVENOUS | Status: DC
  Administered 2023-07-13 – 2023-07-15 (×4): 20 mg via INTRAVENOUS
  Filled 2023-07-13 (×4): qty 50

## 2023-07-13 MED ORDER — KETAMINE HCL 50 MG/5ML IJ SOSY
PREFILLED_SYRINGE | INTRAMUSCULAR | Status: AC
  Filled 2023-07-13: qty 5

## 2023-07-13 MED ORDER — DEXAMETHASONE SODIUM PHOSPHATE 10 MG/ML IJ SOLN
INTRAMUSCULAR | Status: DC | PRN
  Administered 2023-07-13: 8 mg via INTRAVENOUS

## 2023-07-13 MED ORDER — ORAL CARE MOUTH RINSE: 15.0000 mL | OROMUCOSAL | Status: DC | PRN

## 2023-07-13 MED ORDER — ALBUMIN HUMAN 25 % IV SOLN
INTRAVENOUS | Status: AC
  Filled 2023-07-13: qty 50

## 2023-07-13 MED ORDER — MIDAZOLAM HCL 2 MG/2ML IJ SOLN
2.0000 mg | INTRAMUSCULAR | Status: DC | PRN
  Administered 2023-07-13: 2 mg via INTRAVENOUS
  Filled 2023-07-13: qty 2

## 2023-07-13 MED ORDER — FENTANYL CITRATE (PF) 100 MCG/2ML IJ SOLN
50.0000 ug | Freq: Once | INTRAMUSCULAR | Status: AC
  Administered 2023-07-13: 50 ug via INTRAVENOUS

## 2023-07-13 MED ORDER — FENTANYL CITRATE PF 50 MCG/ML IJ SOSY
25.0000 ug | PREFILLED_SYRINGE | INTRAMUSCULAR | Status: DC | PRN
  Administered 2023-07-13 (×2): 25 ug via INTRAVENOUS
  Administered 2023-07-14: 50 ug via INTRAVENOUS
  Filled 2023-07-13 (×3): qty 1

## 2023-07-13 MED ORDER — CHLORHEXIDINE GLUCONATE 0.12 % MT SOLN
15.0000 mL | Freq: Once | OROMUCOSAL | Status: AC
  Administered 2023-07-13: 15 mL via OROMUCOSAL

## 2023-07-13 MED ORDER — METHYLPREDNISOLONE SODIUM SUCC 40 MG IJ SOLR
40.0000 mg | Freq: Two times a day (BID) | INTRAMUSCULAR | Status: DC
  Administered 2023-07-14 – 2023-07-15 (×3): 40 mg via INTRAVENOUS
  Filled 2023-07-13 (×3): qty 1

## 2023-07-13 MED ORDER — ONDANSETRON HCL 4 MG/2ML IJ SOLN
INTRAMUSCULAR | Status: AC
  Administered 2023-07-13: 4 mg
  Filled 2023-07-13: qty 2

## 2023-07-13 MED ORDER — FUROSEMIDE 10 MG/ML IJ SOLN
40.0000 mg | Freq: Once | INTRAMUSCULAR | Status: AC
  Administered 2023-07-13: 40 mg via INTRAVENOUS

## 2023-07-13 MED ORDER — ACETAMINOPHEN 500 MG PO TABS
1000.0000 mg | ORAL_TABLET | ORAL | Status: AC
  Administered 2023-07-13: 1000 mg via ORAL

## 2023-07-13 MED ORDER — MORPHINE SULFATE (PF) 2 MG/ML IV SOLN
2.0000 mg | INTRAVENOUS | Status: DC | PRN
  Administered 2023-07-14: 2 mg via INTRAVENOUS
  Filled 2023-07-13: qty 1

## 2023-07-13 MED ORDER — HYDROCODONE-ACETAMINOPHEN 5-325 MG PO TABS
1.0000 | ORAL_TABLET | ORAL | Status: DC | PRN
  Administered 2023-07-14 (×2): 2 via ORAL
  Administered 2023-07-14: 1 via ORAL
  Administered 2023-07-15 – 2023-07-16 (×5): 2 via ORAL
  Filled 2023-07-13 (×5): qty 2
  Filled 2023-07-13: qty 1
  Filled 2023-07-13 (×3): qty 2

## 2023-07-13 MED ORDER — DIPHENHYDRAMINE HCL 50 MG/ML IJ SOLN
25.0000 mg | Freq: Once | INTRAMUSCULAR | Status: AC
  Administered 2023-07-13: 25 mg via INTRAVENOUS

## 2023-07-13 MED ORDER — LIDOCAINE HCL (PF) 2 % IJ SOLN
INTRAMUSCULAR | Status: AC
  Filled 2023-07-13: qty 5

## 2023-07-13 MED ORDER — BUPIVACAINE-EPINEPHRINE 0.5% -1:200000 IJ SOLN
INTRAMUSCULAR | Status: DC | PRN
  Administered 2023-07-13: 30 mL

## 2023-07-13 MED ORDER — PIPERACILLIN-TAZOBACTAM 3.375 G IVPB
3.3750 g | Freq: Three times a day (TID) | INTRAVENOUS | Status: DC
  Administered 2023-07-13 – 2023-07-15 (×5): 3.375 g via INTRAVENOUS
  Filled 2023-07-13 (×5): qty 50

## 2023-07-13 MED ORDER — SODIUM CHLORIDE 0.9% IV SOLUTION: Freq: Once | INTRAVENOUS | Status: AC

## 2023-07-13 MED ORDER — CELECOXIB 200 MG PO CAPS
ORAL_CAPSULE | ORAL | Status: AC
  Filled 2023-07-13: qty 1

## 2023-07-13 MED ORDER — EPINEPHRINE HCL 5 MG/250ML IV SOLN IN NS
0.5000 ug/min | INTRAVENOUS | Status: DC
Start: 2023-07-13 — End: 2023-07-14
  Administered 2023-07-13: 0.5 ug/min via INTRAVENOUS
  Filled 2023-07-13: qty 250

## 2023-07-13 MED ORDER — HYDROMORPHONE HCL 1 MG/ML IJ SOLN
INTRAMUSCULAR | Status: AC
Start: 2023-07-13 — End: ?
  Filled 2023-07-13: qty 1

## 2023-07-13 MED ORDER — CEFAZOLIN SODIUM-DEXTROSE 3-4 GM/150ML-% IV SOLN
3.0000 g | INTRAVENOUS | Status: AC
  Administered 2023-07-13: 3 g via INTRAVENOUS
  Filled 2023-07-13: qty 150

## 2023-07-13 MED ORDER — SODIUM CHLORIDE 0.9 % IV SOLN: 250.0000 mL | INTRAVENOUS | Status: AC

## 2023-07-13 MED ORDER — BUPIVACAINE LIPOSOME 1.3 % IJ SUSP
INTRAMUSCULAR | Status: DC | PRN
  Administered 2023-07-13: 20 mL

## 2023-07-13 MED ORDER — SODIUM CHLORIDE FLUSH 0.9 % IV SOLN
INTRAVENOUS | Status: AC
  Filled 2023-07-13: qty 10

## 2023-07-13 MED ORDER — MIDAZOLAM HCL 2 MG/2ML IJ SOLN
INTRAMUSCULAR | Status: DC | PRN
  Administered 2023-07-13: 2 mg via INTRAVENOUS

## 2023-07-13 MED ORDER — SUGAMMADEX SODIUM 200 MG/2ML IV SOLN
INTRAVENOUS | Status: DC | PRN
  Administered 2023-07-13: 200 mg via INTRAVENOUS

## 2023-07-13 MED ORDER — PROPOFOL 10 MG/ML IV BOLUS
INTRAVENOUS | Status: DC | PRN
  Administered 2023-07-13: 180 mg via INTRAVENOUS

## 2023-07-13 MED ORDER — ALBUMIN HUMAN 5 % IV SOLN
INTRAVENOUS | Status: AC
  Filled 2023-07-13: qty 250

## 2023-07-13 MED ORDER — OXYCODONE HCL 5 MG PO TABS: 5.0000 mg | ORAL_TABLET | Freq: Once | ORAL | Status: DC | PRN

## 2023-07-13 SURGICAL SUPPLY — 68 items
BAG PRESSURE INF REUSE 1000 (BAG) ×1 IMPLANT
BNDG GAUZE DERMACEA FLUFF 4 (GAUZE/BANDAGES/DRESSINGS) ×2 IMPLANT
CABLE HIGH FREQUENCY MONO STRZ (ELECTRODE) ×1 IMPLANT
COVER TIP SHEARS 8 DVNC (MISCELLANEOUS) ×2 IMPLANT
COVER WAND RF STERILE (DRAPES) ×2 IMPLANT
DERMABOND ADVANCED .7 DNX12 (GAUZE/BANDAGES/DRESSINGS) ×2 IMPLANT
DEVICE SECURE STRAP 25 ABSORB (INSTRUMENTS) ×1 IMPLANT
DRAIN CHANNEL JP 15F RND 3/16 (MISCELLANEOUS) ×1 IMPLANT
DRAPE ARM DVNC X/XI (DISPOSABLE) ×6 IMPLANT
DRAPE COLUMN DVNC XI (DISPOSABLE) ×2 IMPLANT
DRSG OPSITE POSTOP 3X4 (GAUZE/BANDAGES/DRESSINGS) ×3 IMPLANT
DRSG OPSITE POSTOP 4X10 (GAUZE/BANDAGES/DRESSINGS) ×1 IMPLANT
DRSG OPSITE POSTOP 4X8 (GAUZE/BANDAGES/DRESSINGS) ×1 IMPLANT
DRSG TEGADERM 4X4.75 (GAUZE/BANDAGES/DRESSINGS) ×1 IMPLANT
ELECT REM PT RETURN 9FT ADLT (ELECTROSURGICAL) ×2 IMPLANT
ELECTRODE REM PT RTRN 9FT ADLT (ELECTROSURGICAL) ×2 IMPLANT
EVACUATOR SILICONE 100CC (DRAIN) ×1 IMPLANT
FORCEPS BPLR FENES DVNC XI (FORCEP) ×2 IMPLANT
GAUZE SPONGE 4X4 12PLY STRL (GAUZE/BANDAGES/DRESSINGS) ×1 IMPLANT
GLOVE BIOGEL PI IND STRL 7.0 (GLOVE) ×4 IMPLANT
GLOVE SURG SYN 6.5 ES PF (GLOVE) ×4 IMPLANT
GLOVE SURG SYN 6.5 PF PI (GLOVE) ×2 IMPLANT
GOWN STRL REUS W/ TWL LRG LVL3 (GOWN DISPOSABLE) ×8 IMPLANT
HANDLE SUCTION POOLE (INSTRUMENTS) ×1 IMPLANT
HANDLE YANKAUER SUCT BULB TIP (MISCELLANEOUS) ×1 IMPLANT
IRRIGATOR SUCT 8 DISP DVNC XI (IRRIGATION / IRRIGATOR) ×1 IMPLANT
IV NS 1000ML BAXH (IV SOLUTION) ×1 IMPLANT
LABEL OR SOLS (LABEL) ×1 IMPLANT
LIGASURE IMPACT 36 18CM CVD LR (INSTRUMENTS) ×1 IMPLANT
LIGASURE LAP MARYLAND 5MM 37CM (ELECTROSURGICAL) ×1 IMPLANT
MANIFOLD NEPTUNE II (INSTRUMENTS) ×2 IMPLANT
NDL DRIVE SUT CUT DVNC (INSTRUMENTS) ×1 IMPLANT
NDL HYPO 22X1.5 SAFETY MO (MISCELLANEOUS) ×1 IMPLANT
NDL INSUFFLATION 14GA 120MM (NEEDLE) ×1 IMPLANT
NEEDLE DRIVE SUT CUT DVNC (INSTRUMENTS) ×2 IMPLANT
NEEDLE HYPO 22X1.5 SAFETY MO (MISCELLANEOUS) ×2 IMPLANT
NEEDLE INSUFFLATION 14GA 120MM (NEEDLE) ×2 IMPLANT
OBTURATOR OPTICAL LONG 8 DVNC (TROCAR) ×1 IMPLANT
OBTURATOR OPTICAL STND 8 DVNC (TROCAR) ×2 IMPLANT
OBTURATOR OPTICALSTD 8 DVNC (TROCAR) ×2 IMPLANT
PACK LAP CHOLECYSTECTOMY (MISCELLANEOUS) ×2 IMPLANT
SCISSORS LAP 5X35 DISP (ENDOMECHANICALS) ×1 IMPLANT
SCISSORS MNPLR CVD DVNC XI (INSTRUMENTS) ×2 IMPLANT
SEAL UNIV 5-12 XI (MISCELLANEOUS) ×6 IMPLANT
SET TUBE SMOKE EVAC HIGH FLOW (TUBING) ×2 IMPLANT
SLEEVE Z-THREAD 5X100MM (TROCAR) ×1 IMPLANT
SOL ELECTROSURG ANTI STICK (MISCELLANEOUS) ×2 IMPLANT
SOLUTION ELECTROSURG ANTI STCK (MISCELLANEOUS) ×2 IMPLANT
SPONGE T-LAP 18X18 ~~LOC~~+RFID (SPONGE) ×5 IMPLANT
STAPLER SKIN PROX 35W (STAPLE) ×1 IMPLANT
SUCTION POOLE HANDLE (INSTRUMENTS) ×2 IMPLANT
SUT MNCRL 4-0 27 PS-2 XMFL (SUTURE) ×2 IMPLANT
SUT NYLON 3 0 (SUTURE) ×1 IMPLANT
SUT PDS AB 1 CT1 27 (SUTURE) ×2 IMPLANT
SUT SILK 3-0 18XBRD TIE 12 (SUTURE) ×1 IMPLANT
SUT SILK 3-0 SH-1 18XCR BRD (SUTURE) ×2 IMPLANT
SUT STRATA 3-0 23 RB-1.5 (SUTURE) ×2 IMPLANT
SUT VIC AB 2-0 SH 27XBRD (SUTURE) ×2 IMPLANT
SUT VIC AB 3-0 SH 27X BRD (SUTURE) ×1 IMPLANT
SUTURE MNCRL 4-0 27XMF (SUTURE) ×2 IMPLANT
SUTURE SILK 3-0 SH-1 18XCR BRD (SUTURE) ×1 IMPLANT
SYR 30ML LL (SYRINGE) ×2 IMPLANT
SYS LAPSCP GELPORT 120MM (MISCELLANEOUS) ×2 IMPLANT
SYSTEM LAPSCP GELPORT 120MM (MISCELLANEOUS) ×1 IMPLANT
TAPE TRANSPORE STRL 2 31045 (GAUZE/BANDAGES/DRESSINGS) ×2 IMPLANT
TRAP FLUID SMOKE EVACUATOR (MISCELLANEOUS) ×2 IMPLANT
TROCAR SLEEVE LAP 5X150 (TROCAR) ×1 IMPLANT
WATER STERILE IRR 500ML POUR (IV SOLUTION) ×2 IMPLANT

## 2023-07-13 NOTE — Progress Notes (Addendum)
 Postop exam noted to have developing hematoma underneath the staple line and JP drain with dark sanguinous drainage.  Total of 120 mL emptied from JP.  Stat blood work shows hemoglobin of 13 platelets of 94 INR 1.4.  Due to the concern for continued slow oozing from the incision as well as possible intra-abdominal source, we will proceed with FFP and platelet infusion.  Vitals remained stable at this time.  Will continue to monitor closely with serial abdominal exams as well as next hemoglobin check later today.  UPDATE:  Repeat bedside exam after patient transferred to the floor noted continued dark sanguinous drainage from JP drain total of 190 mL now.  Despite this, patient has minimal complaint of abdominal pain and vitals remained stable with no tachycardia or hypotension recorded.  No evidence of enlarging hematoma underneath the staple line either.  At the time of examination, patient has completed the FFP and platelet transfusion and was complaining of itching with visible rashes developing in both arms.    I stepped out of the room to order Benadryl and shortly afterwards, rapid response called to patient room.  Upon entering, patient was not able to answer questions and was tachypneic, diaphoretic, with blood pressure measurements noted in the low 60s systolics on repeat measurements.  The blood pressure quickly increased back up to systolic low 90s and patient regained consciousness within a couple minutes after I entered the room and was able to answer basic questions such as stating he was having a bad headache.  Patient had an episode of nausea vomiting shortly after regaining consciousness.  Heart rate remained in the high 90s.  Due to concern for possible transfusion reaction, decision made to transfer patient to ICU for further monitoring.  Warm handoff given to ICU provider.  Repeat blood work during the rapid response noted hemoglobin increase now to 14.7, up from 13.7 immediately postop.  JP  drainage rate has slowed down and the blood continues to look more and more old sanguinous drainage.  Plan is to continue to monitor with serial abdominal exams, pressor support as needed, and further imaging for possible PE, TRALI, intra-abdominal bleed, when BP more stable.  Family members are at bedside notified of above, verbalized understanding.  All questions addressed at this time.

## 2023-07-13 NOTE — Anesthesia Preprocedure Evaluation (Addendum)
 Anesthesia Evaluation  Patient identified by MRN, date of birth, ID band Patient awake    Reviewed: Allergy & Precautions, H&P , NPO status , Patient's Chart, lab work & pertinent test results, reviewed documented beta blocker date and time   Airway Mallampati: IV  TM Distance: >3 FB Neck ROM: full    Dental  (+) Dental Advidsory Given   Pulmonary asthma , sleep apnea and Continuous Positive Airway Pressure Ventilation , Current Smoker and Patient abstained from smoking.   Pulmonary exam normal        Cardiovascular hypertension, negative cardio ROS Normal cardiovascular exam Rhythm:regular Rate:Normal     Neuro/Psych  Headaches PSYCHIATRIC DISORDERS  Depression       GI/Hepatic negative GI ROS, Neg liver ROS,GERD  Medicated,,  Endo/Other    Class 3 obesity  Renal/GU negative Renal ROS  negative genitourinary   Musculoskeletal   Abdominal   Peds  Hematology negative hematology ROS (+)   Anesthesia Other Findings Past Medical History: No date: Asthma     Comment:  as a child 03/2016: Bronchitis     Comment:  pt has recovered from bronchitis No date: Chickenpox No date: Depression No date: Frequent headaches No date: GERD (gastroesophageal reflux disease)     Comment:  when I was younger, better now. No date: Hypertension No date: Migraines     Comment:  history of, not now No date: OSA (obstructive sleep apnea)  Past Surgical History: 1977: APPENDECTOMY No date: HERNIA REPAIR 06/01/2016: HYDROCELE EXCISION; Right     Comment:  Procedure: HYDROCELECTOMY ADULT;  Surgeon: Vanna Scotland, MD;  Location: [facility] ORS;  Service: Urology;                Laterality: Right; 1994: knee; Left 1992: KNEE ARTHROSCOPY; Right 2013: UMBILICAL HERNIA REPAIR 06/01/2016: VASECTOMY; N/A     Comment:  Procedure: VASECTOMY;  Surgeon: Vanna Scotland, MD;                Location: [facility] OR[facility];  Service: Urology;   Laterality: N/A;  BMI    Body Mass Index: 44.94 kg/m      Reproductive/Obstetrics negative OB ROS                             Anesthesia Physical Anesthesia Plan  ASA: 3  Anesthesia Plan: General   Post-op Pain Management:    Induction: Intravenous  PONV Risk Score and Plan: 3 and Ondansetron, Dexamethasone and Midazolam  Airway Management Planned: Oral ETT  Additional Equipment:   Intra-op Plan:   Post-operative Plan: Extubation in OR  Informed Consent: I have reviewed the patients History and Physical, chart, labs and discussed the procedure including the risks, benefits and alternatives for the proposed anesthesia with the patient or authorized representative who has indicated his/her understanding and acceptance.     Dental Advisory Given  Plan Discussed with: Anesthesiologist, CRNA and Surgeon  Anesthesia Plan Comments: (Patient consented for risks of anesthesia including but not limited to:  - adverse reactions to medications - damage to eyes, teeth, lips or other oral mucosa - nerve damage due to positioning  - sore throat or hoarseness - Damage to heart, brain, nerves, lungs, other parts of body or loss of life  Patient voiced understanding and assent.)       Anesthesia Quick Evaluation

## 2023-07-13 NOTE — Interval H&P Note (Signed)
 History and Physical Interval Note:  07/13/2023 12:40 PM  PANTELIS ELGERSMA  has presented today for surgery, with the diagnosis of K40.20  Bilateral inguinal hernia.  The various methods of treatment have been discussed with the patient and family. After consideration of risks, benefits and other options for treatment, the patient has consented to  Procedure(s) with comments: LAPAROTOMY, EXPLORATORY (N/A) - Pink pad for case is needed possilble open as a surgical intervention.  The patient's history has been reviewed, patient examined, no change in status, stable for surgery.  I have reviewed the patient's chart and labs.  Questions were answered to the patient's satisfaction.     Joesphine Schemm Tonna Boehringer

## 2023-07-13 NOTE — Consult Note (Signed)
 CRITICAL CARE     Name: AD GUTTMAN MRN: [id number] DOB: 04-08-1966     LOS: 0   SUBJECTIVE FINDINGS & SIGNIFICANT EVENTS    History of Presenting Illness:   58 yo M with hx of obesity , OSA, , EtOH abuse hx of admission to hospital for ETOH withdrawal.  He came in for elective surgery to have inguinal hernia repair with lysis of adhesions.  He had significant bleeding per surgeon which was unusual and required electrocautery and ligature for hemostasis.  He was brought back with abdominal JP drain and had FFP and platelets ordered due to intraoperative bleeding.  After receiving these blood products patient deteriorated and developed circulatory shock, hypoxemia, swelling worse on lower extremities.  He had code RRT called and was emergently brought to ICU due to shock with hypoxemia.   Lines/tubes : Closed System Drain Left LUQ Bulb (JP) 19 Fr. (Active)  Dressing Status Clean, Dry, Intact 07/13/23 1458  Output (mL) 40 mL 07/13/23 1458     Urethral Catheter morgan Double-lumen 16 Fr. (Active)  Site Assessment Clean, Dry, Intact 07/13/23 1458  Output (mL) 175 mL 07/13/23 1458    Microbiology/Sepsis markers: Results for orders placed or performed during the hospital encounter of 05/26/16  Urine culture     Status: Abnormal   Collection Time: 05/26/16  2:56 PM   Specimen: Urine, Random  Result Value Ref Range Status   Specimen Description URINE, RANDOM  Final   Special Requests NONE  Final   Culture (A)  Final    <10,000 COLONIES/mL INSIGNIFICANT GROWTH Performed at [facility], [address], [place], [place] [address]    Report Status 05/28/2016 FINAL  Final    Anti-infectives:  Anti-infectives (From admission, onward)    Start     Dose/Rate Route Frequency Ordered Stop   07/13/23 0715   ceFAZolin (ANCEF) IVPB 3g/150 mL premix        3 g 300 mL/hr over 30 Minutes Intravenous On call to O.R. 07/13/23 0700 07/13/23 0272        Consults: Treatment Team:  Earline Mayotte, MD    PAST MEDICAL HISTORY   Past Medical History:  Diagnosis Date   Asthma    as a child   Bronchitis 03/2016   pt has recovered from bronchitis   Chickenpox    Depression    Frequent headaches    GERD (gastroesophageal reflux disease)    when I was younger, better now.   Hypertension    Migraines    history of, not now   OSA (obstructive sleep apnea)      SURGICAL HISTORY   Past Surgical History:  Procedure Laterality Date   APPENDECTOMY  1977   HERNIA REPAIR     HYDROCELE EXCISION Right 06/01/2016   Procedure: HYDROCELECTOMY ADULT;  Surgeon: Vanna Scotland, MD;  Location: [facility];  Service: Urology;  Laterality: Right;   knee Left 1994   KNEE ARTHROSCOPY Right 1992   UMBILICAL HERNIA REPAIR  2013   VASECTOMY N/A 06/01/2016   Procedure: VASECTOMY;  Surgeon: Vanna Scotland, MD;  Location: [facility] OR[facility];  Service: Urology;  Laterality: N/A;     FAMILY HISTORY   Family History  Problem Relation Age of Onset   Ovarian cancer Mother    Alcohol abuse Father    Hyperlipidemia Father    Hypertension Father    Arthritis Maternal Grandmother    Colon cancer Maternal Grandmother    Ovarian cancer Maternal Grandmother  Breast cancer Maternal Grandmother    Hyperlipidemia Maternal Grandfather    Hypertension Maternal Grandfather    Hyperlipidemia Paternal Grandmother    Heart attack Paternal Grandmother    Hyperlipidemia Paternal Grandfather    Hypertension Paternal Grandfather    Prostate cancer Maternal Uncle    Bladder Cancer Neg Hx    Kidney disease Neg Hx      SOCIAL HISTORY   Social History   Tobacco Use   Smoking status: Every Day    Current packs/day: 0.50    Types: Cigarettes   Smokeless tobacco: Never   Tobacco comments:    March 11 2016  Vaping Use    Vaping status: Never Used  Substance Use Topics   Alcohol use: Yes    Alcohol/week: 4.0 - 6.0 standard drinks of alcohol    Types: 4 - 6 Shots of liquor per week   Drug use: No     MEDICATIONS   Current Medication:  Current Facility-Administered Medications:    0.9 %  sodium chloride infusion (Manually program via Guardrails IV Fluids), , Intravenous, Once, Sakai, Isami, DO   0.9 %  sodium chloride infusion (Manually program via Guardrails IV Fluids), , Intravenous, Once, Sakai, Isami, DO   0.9 %  sodium chloride infusion, 250 mL, Intravenous, Continuous, [name] D, NP   diphenhydrAMINE (BENADRYL) injection 25 mg, 25 mg, Intravenous, Once, Sakai, Isami, DO   furosemide (LASIX) 10 MG/ML injection, , , ,    methylPREDNISolone sodium succinate (SOLU-MEDROL) 125 mg/2 mL injection 125 mg, 125 mg, Intravenous, Once, [name] D, NP   norepinephrine (LEVOPHED) 4-5 MG/250ML-% infusion SOLN, , , ,    norepinephrine (LEVOPHED) 4mg  in (0.016 mg/mL) premix infusion, 2-10 mcg/min, Intravenous, Titrated, [name] D, NP, Last Rate: 7.5 mL/hr at 07/13/23 1610, 2 mcg/min at 07/13/23 1610    ALLERGIES   Sulfa antibiotics    REVIEW OF SYSTEMS     10 point ROS done and is + for pruritus, headache , dyspnea, otherwise as per subjective findings  PHYSICAL EXAMINATION   Vital Signs: Temp:  [97.1 F (36.2 C)-98.2 F (36.8 C)] 97.9 F (36.6 C) (03/18 1500) Pulse Rate:  [72-105] 105 (03/18 1600) Resp:  [7-31] 24 (03/18 1600) BP: (80-155)/(62-85) 80/68 (03/18 1600) SpO2:  [88 %-100 %] 92 % (03/18 1600) Weight:  [158.8 kg] 158.8 kg (03/18 0706)  GENERAL:Mild distress and confusion  HEAD: Normocephalic, atraumatic.  EYES: Pupils equal, round, reactive to light.  No scleral icterus.  MOUTH: Moist mucosal membrane. NECK: Supple. No thyromegaly. No nodules. No JVD.  PULMONARY: wheezing and rhonchi bilaterally  CARDIOVASCULAR: S1 and S2. Regular rate and rhythm. No  murmurs, rubs, or gallops.  GASTROINTESTINAL: Soft, post surgery- tender without peritoneal signs, distended. No masses. Positive bowel sounds. No hepatosplenomegaly.  MUSCULOSKELETAL: No swelling, clubbing, or edema.  NEUROLOGIC: Mild distress due to acute illness SKIN:intact,warm,dry   PERTINENT DATA     Infusions:  sodium chloride     norepinephrine     norepinephrine (LEVOPHED) Adult infusion 2 mcg/min (07/13/23 1610)   Scheduled Medications:  sodium chloride   Intravenous Once   sodium chloride   Intravenous Once   diphenhydrAMINE  25 mg Intravenous Once   furosemide       methylPREDNISolone (SOLU-MEDROL) injection  125 mg Intravenous Once   PRN Medications: furosemide, norepinephrine Hemodynamic parameters:   Intake/Output: No intake/output data recorded.  Ventilator  Settings:     LAB RESULTS:  Basic Metabolic Panel: No results for input(s): "  NA", "K", "CL", "CO2", "GLUCOSE", "BUN", "CREATININE", "CALCIUM", "MG", "PHOS" in the last 168 hours. Liver Function Tests: No results for input(s): "AST", "ALT", "ALKPHOS", "BILITOT", "PROT", "ALBUMIN" in the last 168 hours. No results for input(s): "LIPASE", "AMYLASE" in the last 168 hours. No results for input(s): "AMMONIA" in the last 168 hours. CBC: Recent Labs  Lab 07/13/23 1205 07/13/23 1533  WBC 8.5 5.7  HGB 13.7 14.7  HCT 39.4 42.6  MCV 102.3* 100.5*  PLT 94* 142*   Cardiac Enzymes: No results for input(s): "CKTOTAL", "CKMB", "CKMBINDEX", "TROPONINI" in the last 168 hours. BNP: Invalid input(s): "POCBNP" CBG: Recent Labs  Lab 07/13/23 1527  GLUCAP 157*       IMAGING RESULTS:      ASSESSMENT AND PLAN    -Multidisciplinary rounds held today  Acute Hypoxic Respiratory Failure -CXR with notable blunting of cardiophrenic and costophrenic angles.   -patient developed acute hypoxemia post operatively and post blood product transfusion  - will treat for transfusion related acute lung injury  and circulatory overload we will also perform CTPE protocol when safe - will start on steroids and diuresis as soon as possible.  -Currently patient is afebrile, does report dyspnea and severe pruritus, does have worsening LE edema and abd distension , does have wheezing bilaterally, CBC without leukocytosis or acute decrement in h/h with improved platelets post delivery of 1 unit platelets -BNP ordered  -TTE ordered  -currently requiring >4L/min [place] was on room air post operatively    Alcoholism   - previous admission for ETOH withdrawal   - CIWA protocol    - thiamine and folate      Post inguinal hernia and lysis of adhesion surgery   - reviewed in detail with surgeon Dr Tonna Boehringer - JP drain slowing down, h/h stable     - planning to re-image when more clnically stable   ID -continue IV abx as prescibed -follow up cultures  GI/Nutrition GI PROPHYLAXIS as indicated DIET-->TF's as tolerated Constipation protocol as indicated  ENDO - ICU hypoglycemic\Hyperglycemia protocol -check FSBS per protocol   ELECTROLYTES -follow labs as needed -replace as needed -pharmacy consultation   DVT/GI PRX ordered -SCDs  TRANSFUSIONS AS NEEDED MONITOR FSBS ASSESS the need for LABS as needed    Critical care provider statement:   Total critical care time: 62  minutes   Performed by: Karna Christmas MD   Critical care time was exclusive of separately billable procedures and treating other patients.   Critical care was necessary to treat or prevent imminent or life-threatening deterioration.   Critical care was time spent personally by me on the following activities: development of treatment plan with patient and/or surrogate as well as nursing, discussions with consultants, evaluation of patient's response to treatment, examination of patient, obtaining history from patient or surrogate, ordering and performing treatments and interventions, ordering and review of laboratory studies,  ordering and review of radiographic studies, pulse oximetry and re-evaluation of patient's condition.    Vida Rigger, M.D.  Pulmonary & Critical Care Medicine

## 2023-07-13 NOTE — Op Note (Addendum)
 Preoperative diagnosis: Bilateral inguinal, initial, reducible hernia Postoperative diagnosis: same plus intra-abdominal adhesions  Procedure: Diagnostic laparoscopy, lysis of adhesions, converted to exploratory laparotomy Anesthesia: general  Surgeon: Sung Amabile  Wound Classification: Clean  Specimen: none  Complications: None  Estimated Blood Loss:  Indications:see HPI  Findings: Omental and small bowel adhesions to prior ventral hernia repair 2.  Persistent nonpulsatile bleeding throughout the entire omentum after removal of adhesions, requiring conversion to exploratory laparotomy for adequate visualization   3.  Persistent nonpulsatile bleeding noted throughout skin incision as well as omentum requiring electrocautery and ligature for improved hemostasis  Description of procedure: The patient was brought to the operating room and general anesthesia was induced. A time-out was completed verifying correct patient, procedure, site, positioning, and implant(s) and/or special equipment prior to beginning this procedure. Antibiotics were administered prior to making the incision. SCDs placed. The anterior abdominal wall was prepped and draped in the standard sterile fashion.   Palmer's point chosen for entry.  Veress needle placed and abdomen insufflated to 15cm without any dramatic increase in pressure.  Needle removed 5 mm XL port placed in same area via Optiview technique.  Inspection of the abdomen noted omental and small bowel adhesions to the previous midline ventral hernia repair.  1 additional 5 mm port was placed to the left of the initial port and using laparoscopic scissors, the adhesions were removed.  Some bleeding noted during this portion which initially seemed to be controlled with cautery.  Thicker adhesions removed using laparoscopic LigaSure.  Care noted to not involve bowel serosa during this portion of the procedure.  Once all adhesions removed and inspection of  the omentum noted large amounts of blood clots and areas of active bleeding that was visible.  Additional 8 mm port placed in the right lateral aspect in hopes of proceeding with robotic assisted repair after control of the bleeding.  Using the 2 ports bleeding, was initially controlled with additional use of electrocautery and LigaSure.  However, during this portion of the procedure multiple areas of nonpulsatile bleeding was visualized throughout the entire omentum, despite the omentum not being under excess tension or manipulation from the laparoscopic instruments.  Due to the large area of bleeding and difficult visualization through the laparoscope alone, decision made to convert to an exploratory laparotomy.  Majority of the bleeding omentum noted to be in the left upper quadrant so a paramedian incision made lateral to the previous ventral hernia repair and dissection carried down through the muscle to the fascia.  Of note, excess bleeding was also noted during this portion while attempting access to the abdominal cavity.  This was controlled with electrocautery.  Abdominal cavity then entered, wound protector placed and the omentum was pulled out of the abdominal cavity for better visualization.  Areas of extensive nonpulsatile bleeding noted and controlled with combination of electrocautery, partial resection of the bleeding areas with ligature, and suture ligation with 3-0 silk.  After multiple repeat inspections of the omentum outside of the abdominal cavity as well as within the abdominal cavity after reduction to ensure bleeding does not recur within the abdominal cavity, the abdomen was extensively irrigated and all irrigation and all blood clots removed.  Eventually, no pulsatile or new pooling of venous bleeding was noted within the abdominal cavity as well as the area of the omentum.  Due to the abnormally fragile tissue and extensive bleeding with just lysis of adhesions, decision made to abort  the procedure and repair of the  inguinal hernias due to the concerns of further bleeding complications.  Abdominal cavity again examined 1 last time to ensure no obvious pulling of new blood before placing a 15 [place] round drain atop the area and pulled through the left lateral port site, secured to skin using 3-0 nylon.  Paramedian incision fascia then closed with 1 PDS x 2 after injection of Exparel.  Subcutaneous layer approximated using interrupted 3-0 Vicryl prior to closing with staples.  Remaining port sites were also closed with staples as well.  Port site and incision covered with honeycomb dressing and drain site covered with 4 x 4 and Tegaderm.  Patient was then successfully awakened and transferred to PACU in stable condition.  At the end of the procedure sponge and instrument counts were correct.

## 2023-07-13 NOTE — Progress Notes (Signed)
 Pharmacy Antibiotic Note  [name] is a 58 y.o. male w/ PMH of obesity , OSA, , EtOH abuse admitted on 07/13/2023 with inguinal hernia repair.  Pharmacy has been consulted for Zosyn dosing.  Plan: start Zosyn 3.375g IV q8h (4 hour infusion). ---follow renal function for needed dose adjustment  Height: 6\' 2"  (188 cm) Weight: (!) 158.8 kg (350 lb) IBW/kg (Calculated) : 82.2  Temp (24hrs), Avg:97.6 F (36.4 C), Min:97.1 F (36.2 C), Max:98.2 F (36.8 C)  Recent Labs  Lab 07/13/23 1205 07/13/23 1533  WBC 8.5 5.7  CREATININE  --  1.10    Estimated Creatinine Clearance: 121.1 mL/min (by C-G formula based on SCr of 1.1 mg/dL).    Allergies  Allergen Reactions   Sulfa Antibiotics Anaphylaxis and Hives    Antimicrobials this admission: 03/18 cefazolin x 1  03/18 Zosyn >>   Microbiology results: 03/18 BCx: pending  Thank you for allowing pharmacy to be a part of this patient's care.  Lowella Bandy 07/13/2023 5:40 PM

## 2023-07-13 NOTE — Procedures (Signed)
 Central Venous Catheter Insertion Procedure Note  [name]  [id number]  1966/02/19  Date:07/13/23  Time:5:26 PM   Provider Performing:Sydelle Sherfield D Elvina Sidle   Procedure: Insertion of Non-tunneled Central Venous [phone]) with US guidance (56213)   Indication(s) Medication administration and Difficult access  Consent Risks of the procedure as well as the alternatives and risks of each were explained to the patient and/or caregiver.  Consent for the procedure was obtained and is signed in the bedside chart  Anesthesia Topical only with 1% lidocaine   Timeout Verified patient identification, verified procedure, site/side was marked, verified correct patient position, special equipment/implants available, medications/allergies/relevant history reviewed, required imaging and test results available.  Sterile Technique Maximal sterile technique including full sterile barrier drape, hand hygiene, sterile gown, sterile gloves, mask, hair covering, sterile ultrasound probe cover (if used).  Procedure Description Area of catheter insertion was cleaned with chlorhexidine and draped in sterile fashion.  With real-time ultrasound guidance a central venous catheter was placed into the right internal jugular vein. Nonpulsatile blood flow and easy flushing noted in all ports.  The catheter was sutured in place and sterile dressing applied.  Complications/Tolerance None; patient tolerated the procedure well. Chest X-ray is ordered to verify placement for internal jugular or subclavian cannulation.   Chest x-ray is not ordered for femoral cannulation.  EBL Minimal  Specimen(s) None    Line inserted to the 18 cm mark.   [name], AGACNP-BC [facility] Prefer epic messenger for cross cover needs If after hours, please call E-link

## 2023-07-13 NOTE — Progress Notes (Signed)
 Re-examined.  Off pressors now.  Abdominal exam with some TTP but no concern for overt peritonitis.  JP remains dark sanguinous, since surgery, with out the initial couple hours.  Hgb remains stable through repeat testing thus far.  Continue with hgb check and abdominal exams, but no need for abdominal imaging from surgery standpoint based on current clinical exam

## 2023-07-13 NOTE — Progress Notes (Signed)
   07/13/23 1524  Spiritual Encounters  Type of Visit Initial  Care provided to: Family (Daughter, Erica at bedside; accompanied her to ICU from 2C Rm 214)  Conversation partners present during encounter Other (comment) (Rapid Response Team)  Referral source Code page  Reason for visit Code  OnCall Visit Yes  Spiritual Framework  Presenting Themes Meaning/purpose/sources of inspiration;Values and beliefs;Significant life change;Impactful experiences and emotions;Courage hope and growth;Other (comment) (For Daughter)  Family Stress Factors Other (Comment) (Pt and Daughter have been helping the Pt's brother take care of the Pt's sister-in-law (stroke victim))  Interventions  Spiritual Care Interventions Made Established relationship of care and support;Compassionate presence;Reflective listening;Normalization of emotions;Narrative/life review;Explored values/beliefs/practices/strengths;Encouragement;Other (comment) (for Daughter)  Intervention Outcomes  Outcomes Connection to spiritual care;Awareness around self/spiritual resourses;Awareness of support  Spiritual Care Plan  Spiritual Care Issues Still Outstanding Referring to oncoming chaplain for further support

## 2023-07-13 NOTE — Transfer of Care (Signed)
 Immediate Anesthesia Transfer of Care Note  Patient: [name]  Procedure(s) Performed: LAPAROTOMY, EXPLORATORY  Patient Location: PACU  Anesthesia Type:General  Level of Consciousness: drowsy and patient cooperative  Airway & Oxygen Therapy: Patient Spontanous Breathing and Patient connected to face mask oxygen  Post-op Assessment: Report given to RN and Post -op Vital signs reviewed and stable  Post vital signs: Reviewed and stable  Last Vitals:  Vitals Value Taken Time  BP 123/67 07/13/23 1145  Temp 36.2 C 07/13/23 1138  Pulse 85 07/13/23 1149  Resp 15 07/13/23 1149  SpO2 97 % 07/13/23 1149  Vitals shown include unfiled device data.  Last Pain:  Vitals:   07/13/23 0725  TempSrc:   PainSc: 0-No pain         Complications: No notable events documented.

## 2023-07-13 NOTE — Progress Notes (Signed)
   07/13/23 1630  Spiritual Encounters  Type of Visit Follow up  Care provided to: Pt and family (Daughter at bedside)  Conversation partners present during Programmer, systems  Referral source Chaplain assessment  Reason for visit Code  OnCall Visit Yes  Spiritual Framework  Presenting Themes Impactful experiences and emotions  Interventions  Spiritual Care Interventions Made Established relationship of care and support;Compassionate presence;Encouragement  Intervention Outcomes  Outcomes Awareness of support;Reduced anxiety (For Pt and family)  Spiritual Care Plan  Spiritual Care Issues Still Outstanding Referral made to (name) (Chaplain Maralyn Sago will wait w/daughter in ICU waiting room)

## 2023-07-13 NOTE — Anesthesia Procedure Notes (Signed)
 Procedure Name: Intubation Date/Time: 07/13/2023 8:38 AM  Performed by: Jaye Beagle, CRNAPre-anesthesia Checklist: Patient identified, Emergency Drugs available, Suction available and Patient being monitored Patient Re-evaluated:Patient Re-evaluated prior to induction Oxygen Delivery Method: Circle system utilized Preoxygenation: Pre-oxygenation with 100% oxygen Induction Type: IV induction Ventilation: Mask ventilation without difficulty Laryngoscope Size: McGrath and 4 Grade View: Grade I Tube type: Oral Tube size: 7.5 mm Number of attempts: 1 Airway Equipment and Method: Stylet and Oral airway Placement Confirmation: ETT inserted through vocal cords under direct vision, positive ETCO2 and breath sounds checked- equal and bilateral Secured at: 22 cm Tube secured with: Tape Dental Injury: Teeth and Oropharynx as per pre-operative assessment

## 2023-07-13 NOTE — Significant Event (Signed)
 Rapid Response Event Note   Reason for Call : Transfusion reaction   Initial Focused Assessment: Patient just arrived to [facility] from PACU. Dr. Tonna Boehringer at bedside. Patient just finished receiving a unit of FFP and Platelets. Patient complained of itchiness all over, developed a blank stare with brief loss of consciousness, and became clammy and diaphoretic. BP 40s/20s, HR 87, RR 17, 88% on 2 liters.      Interventions:  Patient laid flat for hypotension, non-rebreather placed, 1 liter LR bolus initiated. Placed on ICU monitor and transferred to ICU RM 3. Vitals upon leaving 2C BP 140/111, HR 87, RR 17, 99% on 2 liters [place].   Plan of Care:  Transfer to ICU for further work-up and management.    Event Summary: As above.   MD Notified: Dr. Tonna Boehringer  Call Time: 1525 Arrival Time:1525 End Time:1550  Rosemary Holms, RN

## 2023-07-14 ENCOUNTER — Inpatient Hospital Stay (HOSPITAL_COMMUNITY)
Admission: RE | Admit: 2023-07-14 | Discharge: 2023-07-14 | Disposition: A | Discharge status: Home / Self Care | Payer: MEDICAID | Source: Home / Self Care | Attending: Pulmonary Disease | Admitting: Pulmonary Disease

## 2023-07-14 ENCOUNTER — Encounter: Payer: Self-pay | Admitting: Surgery

## 2023-07-14 ENCOUNTER — Inpatient Hospital Stay: Payer: MEDICAID

## 2023-07-14 DIAGNOSIS — I509 Heart failure, unspecified: Secondary | ICD-10-CM

## 2023-07-14 DIAGNOSIS — K439 Ventral hernia without obstruction or gangrene: Secondary | ICD-10-CM | POA: Diagnosis not present

## 2023-07-14 LAB — BPAM FFP
Blood Product Expiration Date: 202503222359
ISSUE DATE / TIME: 202503181430
Unit Type and Rh: 6200

## 2023-07-14 LAB — RENAL FUNCTION PANEL
Albumin: 2.9 g/dL — ABNORMAL LOW (ref 3.5–5.0)
Anion gap: 6 (ref 5–15)
BUN: 23 mg/dL — ABNORMAL HIGH (ref 6–20)
CO2: 21 mmol/L — ABNORMAL LOW (ref 22–32)
Calcium: 8.5 mg/dL — ABNORMAL LOW (ref 8.9–10.3)
Chloride: 108 mmol/L (ref 98–111)
Creatinine, Ser: 1.29 mg/dL — ABNORMAL HIGH (ref 0.61–1.24)
GFR, Estimated: >60 mL/min (ref >60)
Glucose, Bld: 149 mg/dL — ABNORMAL HIGH (ref 70–99)
Phosphorus: 3.1 mg/dL (ref 2.5–4.6)
Potassium: 4.5 mmol/L (ref 3.5–5.1)
Sodium: 135 mmol/L (ref 135–145)

## 2023-07-14 LAB — TRANSFUSION REACTION
DAT C3: NEGATIVE
Post RXN DAT IgG: NEGATIVE

## 2023-07-14 LAB — ECHOCARDIOGRAM COMPLETE BUBBLE STUDY
AR max vel: 2.91 cm2
AV Area VTI: 3.13 cm2
AV Area mean vel: 3.3 cm2
AV Mean grad: 6 mmHg
AV Peak grad: 13.2 mmHg
Ao pk vel: 1.82 m/s
Area-P 1/2: 3.46 cm2
MV VTI: 3.07 cm2
S' Lateral: 2.8 cm

## 2023-07-14 LAB — CBC
HCT: 38.1 % — ABNORMAL LOW (ref 39.0–52.0)
Hemoglobin: 13 g/dL (ref 13.0–17.0)
MCH: 35 pg — ABNORMAL HIGH (ref 26.0–34.0)
MCHC: 34.1 g/dL (ref 30.0–36.0)
MCV: 102.7 fL — ABNORMAL HIGH (ref 80.0–100.0)
Platelets: 116 10*3/uL — ABNORMAL LOW (ref 150–400)
RBC: 3.71 MIL/uL — ABNORMAL LOW (ref 4.22–5.81)
RDW: 14.4 % (ref 11.5–15.5)
WBC: 10.5 10*3/uL (ref 4.0–10.5)
nRBC: 0 % (ref 0.0–0.2)

## 2023-07-14 LAB — PREPARE PLATELET PHERESIS: Unit division: 0

## 2023-07-14 LAB — BPAM PLATELET PHERESIS
Blood Product Expiration Date: 202503202359
ISSUE DATE / TIME: 202503181430
Unit Type and Rh: 7300

## 2023-07-14 LAB — URINALYSIS, COMPLETE (UACMP) WITH MICROSCOPIC
Bacteria, UA: NONE SEEN
Bilirubin Urine: NEGATIVE
Glucose, UA: NEGATIVE mg/dL
Ketones, ur: NEGATIVE mg/dL
Nitrite: NEGATIVE
Protein, ur: NEGATIVE mg/dL
Specific Gravity, Urine: 1.016 (ref 1.005–1.030)
pH: 5 (ref 5.0–8.0)

## 2023-07-14 LAB — PREPARE FRESH FROZEN PLASMA

## 2023-07-14 LAB — MRSA NEXT GEN BY PCR, NASAL: MRSA by PCR Next Gen: NOT DETECTED

## 2023-07-14 LAB — URINE CULTURE: Culture: NO GROWTH

## 2023-07-14 LAB — HIV ANTIBODY (ROUTINE TESTING W REFLEX): HIV Screen 4th Generation wRfx: NONREACTIVE

## 2023-07-14 LAB — MAGNESIUM: Magnesium: 1.6 mg/dL — ABNORMAL LOW (ref 1.7–2.4)

## 2023-07-14 MED ORDER — MAGNESIUM SULFATE 2 GM/50ML IV SOLN
2.0000 g | Freq: Once | INTRAVENOUS | Status: AC
  Administered 2023-07-14: 2 g via INTRAVENOUS
  Filled 2023-07-14: qty 50

## 2023-07-14 MED ORDER — ADULT MULTIVITAMIN W/MINERALS CH
1.0000 | ORAL_TABLET | Freq: Every day | ORAL | Status: DC
  Administered 2023-07-15 – 2023-07-16 (×2): 1 via ORAL
  Filled 2023-07-14 (×2): qty 1

## 2023-07-14 MED ORDER — FUROSEMIDE 10 MG/ML IJ SOLN
40.0000 mg | Freq: Once | INTRAMUSCULAR | Status: AC
  Administered 2023-07-14: 40 mg via INTRAVENOUS
  Filled 2023-07-14: qty 4

## 2023-07-14 MED ORDER — TAMSULOSIN HCL 0.4 MG PO CAPS
0.4000 mg | ORAL_CAPSULE | Freq: Every day | ORAL | Status: DC
  Administered 2023-07-14 – 2023-07-16 (×3): 0.4 mg via ORAL
  Filled 2023-07-14 (×3): qty 1

## 2023-07-14 MED ORDER — ENSURE MAX PROTEIN PO LIQD
11.0000 [oz_av] | Freq: Two times a day (BID) | ORAL | Status: DC
  Administered 2023-07-14 – 2023-07-16 (×4): 11 [oz_av] via ORAL
  Filled 2023-07-14: qty 330

## 2023-07-14 NOTE — Progress Notes (Signed)
 OT Cancellation Note  Patient Details Name: [name] MRN: [id number] DOB: June 08, 1965   Cancelled Treatment:    Reason Eval/Treat Not Completed: Pain limiting ability to participate (OT orders recieved, chart reviewed. Pt declines OT services at this time 2/2 increased pain. Will re-attempt at a later date/time as available.)  Rockney Ghee, M.S., OTR/L 07/14/23, 1:59 PM

## 2023-07-14 NOTE — Anesthesia Postprocedure Evaluation (Signed)
 Anesthesia Post Note  Patient: [name]  Procedure(s) Performed: LAPAROTOMY, EXPLORATORY  Patient location during evaluation: ICU Anesthesia Type: General Level of consciousness: awake and alert and oriented Pain management: pain level controlled Vital Signs Assessment: post-procedure vital signs reviewed and stable Respiratory status: respiratory function stable Cardiovascular status: stable Postop Assessment: no apparent nausea or vomiting and adequate PO intake Anesthetic complications: no   No notable events documented.   Last Vitals:  Vitals:   07/14/23 0636 07/14/23 0700  BP:  115/65  Pulse: 63 72  Resp: 10 16  Temp:    SpO2: 97% 94%    Last Pain:  Vitals:   07/14/23 0636  TempSrc:   PainSc: 7                  Zachary George

## 2023-07-14 NOTE — Progress Notes (Signed)
 Subjective:  CC: [name] is a 58 y.o. male  Hospital stay day 1, 1 Day Post-Op exploratory laparotomy for aborted inguinal hernia repair surgery due to bleeding issues  HPI: No acute issues overnight.  States he is sore all over.  ROS:  General: Denies weight loss, weight gain, fatigue, fevers, chills, and night sweats. Heart: Denies chest pain, palpitations, racing heart, irregular heartbeat, leg pain or swelling, and decreased activity tolerance. Respiratory: Denies breathing difficulty, shortness of breath, wheezing, cough, and sputum. GI: Denies change in appetite, heartburn, nausea, vomiting, constipation, diarrhea, and blood in stool. GU: Denies difficulty urinating, pain with urinating, urgency, frequency, blood in urine.   Objective:   Temp:  [97.1 F (36.2 C)-98.7 F (37.1 C)] 97.7 F (36.5 C) (03/19 0400) Pulse Rate:  [63-109] 74 (03/19 0800) Resp:  [7-31] 21 (03/19 0800) BP: (80-150)/(52-90) 139/71 (03/19 0800) SpO2:  [88 %-100 %] 97 % (03/19 0800)     Height: 6\' 2"  (188 cm) Weight: (!) 158.8 kg BMI (Calculated): 44.92   Intake/Output this shift:   Intake/Output Summary (Last 24 hours) at 07/14/2023 0920 Last data filed at 07/14/2023 0400 Gross per 24 hour  Intake 2978.49 ml  Output 1655 ml  Net 1323.49 ml    Constitutional :  alert, cooperative, appears stated age, and no distress  Respiratory:  clear to auscultation bilaterally  Cardiovascular:  regular rate and rhythm  Gastrointestinal: Soft, no guarding, TTP around staple line, with swelling consistent with likely hematoma . JP with dark sanguinous drainage.  Skin: Cool and moist.   Psychiatric: Normal affect, non-agitated, not confused       LABS:     Latest Ref Rng & Units 07/14/2023    3:58 AM 07/13/2023    3:33 PM 07/04/2023   10:04 PM  CMP  Glucose 70 - 99 mg/dL 409  811  96   BUN 6 - 20 mg/dL 23  16  10    Creatinine 0.61 - 1.24 mg/dL 9.14  7.82  9.56   Sodium 135 - 145 mmol/L 135  137  142    Potassium 3.5 - 5.1 mmol/L 4.5  4.5  4.5   Chloride 98 - 111 mmol/L 108  108  109   CO2 22 - 32 mmol/L 21  22  28    Calcium 8.9 - 10.3 mg/dL 8.5  8.6  9.2   Total Protein 6.5 - 8.1 g/dL  5.9  7.3   Total Bilirubin 0.0 - 1.2 mg/dL  2.0  1.6   Alkaline Phos 38 - 126 U/L  79  110   AST 15 - 41 U/L  33  41   ALT 0 - 44 U/L  17  22       Latest Ref Rng & Units 07/14/2023    3:58 AM 07/13/2023   10:04 PM 07/13/2023    4:51 PM  CBC  WBC 4.0 - 10.5 K/uL 10.5   15.5   Hemoglobin 13.0 - 17.0 g/dL 21.3  08.6  57.8   Hematocrit 39.0 - 52.0 % 38.1  40.0  41.4   Platelets 150 - 400 K/uL 116   147     RADS: N/a Assessment:   S/p exploratory laparotomy for aborted inguinal hernia repair surgery due to bleeding issues.  Clinically stable.  No further issues overnight.  Episodes yesterday likely related to transfusion.  Wean off oxygen as tolerated, okay to start regular diet.  Transition of ICU hopefully and continue to monitor  labs/images/medications/previous chart entries  reviewed personally and relevant changes/updates noted above.

## 2023-07-14 NOTE — Progress Notes (Signed)
   07/14/23 1400  Spiritual Encounters  Type of Visit Follow up  Care provided to: Pt and family (Daughter at bedside)  Referral source Chaplain assessment  Reason for visit Routine spiritual support  OnCall Visit No  Spiritual Framework  Presenting Themes Meaning/purpose/sources of inspiration;Goals in life/care;Coping tools  Interventions  Spiritual Care Interventions Made Established relationship of care and support;Compassionate presence;Reflective listening;Normalization of emotions;Meaning making;Self-care teaching;Encouragement  Intervention Outcomes  Outcomes Connection to spiritual care;Connection to values and goals of care;Awareness of health;Awareness of support;Reduced anxiety

## 2023-07-14 NOTE — Consult Note (Signed)
 Initial Consultation Note   Patient: [name] ION:[id number] DOB: [birth year] PCP: Ailene Ravel, MD DOA: 07/13/2023 DOS: the patient was seen and examined on 07/14/2023 Primary service: Sung Amabile, DO  Referring physician: Dr. Milford Cage Reason for consult: Seizure-like activity  Assessment/Plan:  Seizure-like activity versus syncope Anaphylactic reaction Anaphylactic shock -Resolved -Most likely allergic reaction to blood transfusion.  Resolved after symptomatic management with steroid, H1 and H2 blocker. -Blood bank informed and further transfusion related reaction under investigation. -Other DDx, given his clinical presentation, low suspicion for seizure disorder.  And patient denied any recent alcohol use, Sus low suspicion for alcohol withdrawal seizure.  Clinical patient does not have any symptoms signs of alcohol visual at this point.   Acute hypoxic respiratory failure -TRALI was suspected and patient received IV Lasix x 2 and now back to nasal cannula.  UTI Likely underlying BPH -Continue antibiotics as per primary team -Start Flomax, outpatient follow-up with urology for urodynamic study  Right inguinal hernia -As per primary team  TRH will continue to follow the patient.  HPI: [name] is a 58 y.o. male with past medical history of morbid obesity, mild intermittent asthma, right inguinal hernia, GERD, OSA, migraines, who was admitted for elective right inguinal hernia repair, but during early stage of the procedure, it was found patient uncontrolled bleeding from the surgical site and surgery was aborted.  Patient was given FFP and platelet transfusion, however soon patient started develop tingling and burning sensation of bilateral feet ascending to bilateral calfs and thighs abdomen and chest wall and bilateral arms with hives and feeling of " hot all over" and soon patient passed out.  Consider up reported that the patient lost consciousness and started to  have shakings for 2 to 3 minutes and recovered consciousness by his own.  During the episode, patient appeared to be having rash on his skins and developed respiratory distress and tremor versus shakings of his torso.  And oxygen level dropped which required additional oxygen support.  RRT was called, patient was given IV Solu-Medrol H1 and H2 blocker and Lasix.  Right now patient has no complaint denies any shortness of breath no chest pain.  Patient does complain about urinary frequency and burning sensations.  He also reported frequent nocturnal urination and sensation of not completed emptiness after voiding and broken stream.  Review of Systems: As mentioned in the history of present illness. All other systems reviewed and are negative. Past Medical History:  Diagnosis Date   Asthma    as a child   Bronchitis 03/2016   pt has recovered from bronchitis   Chickenpox    Depression    Frequent headaches    GERD (gastroesophageal reflux disease)    when I was younger, better now.   Hypertension    Migraines    history of, not now   OSA (obstructive sleep apnea)    Past Surgical History:  Procedure Laterality Date   APPENDECTOMY  1977   HERNIA REPAIR     HYDROCELE EXCISION Right 06/01/2016   Procedure: HYDROCELECTOMY ADULT;  Surgeon: Vanna Scotland, MD;  Location: [facility] ORS;  Service: Urology;  Laterality: Right;   knee Left 1994   KNEE ARTHROSCOPY Right 1992   LAPAROTOMY N/A 07/13/2023   Procedure: LAPAROTOMY, EXPLORATORY;  Surgeon: Sung Amabile, DO;  Location: [facility] ORS;  Service: General;  Laterality: N/A;  Pink pad for case is needed possilble open   UMBILICAL HERNIA REPAIR  2013   VASECTOMY N/A 06/01/2016  Procedure: VASECTOMY;  Surgeon: Vanna Scotland, MD;  Location: [facility] ORS;  Service: Urology;  Laterality: N/A;   Social History:  reports that he has been smoking cigarettes. He has never used smokeless tobacco. He reports current alcohol use of about 4.0 - 6.0 standard drinks of  alcohol per week. He reports that he does not use drugs.  Allergies  Allergen Reactions   Sulfa Antibiotics Anaphylaxis and Hives    Family History  Problem Relation Age of Onset   Ovarian cancer Mother    Alcohol abuse Father    Hyperlipidemia Father    Hypertension Father    Arthritis Maternal Grandmother    Colon cancer Maternal Grandmother    Ovarian cancer Maternal Grandmother    Breast cancer Maternal Grandmother    Hyperlipidemia Maternal Grandfather    Hypertension Maternal Grandfather    Hyperlipidemia Paternal Grandmother    Heart attack Paternal Grandmother    Hyperlipidemia Paternal Grandfather    Hypertension Paternal Grandfather    Prostate cancer Maternal Uncle    Bladder Cancer Neg Hx    Kidney disease Neg Hx     Prior to Admission medications   Medication Sig Start Date End Date Taking? Authorizing Provider  oxyCODONE (ROXICODONE) 5 MG immediate release tablet Take 1 tablet (5 mg total) by mouth every 8 (eight) hours as needed. 07/05/23 07/04/24 Yes Delton Prairie, MD    Physical Exam: Vitals:   07/14/23 0636 07/14/23 0700 07/14/23 0800 07/14/23 0900  BP:  115/65 139/71 118/64  Pulse: 63 72 74 68  Resp: 10 16 (!) 21 16  Temp:   97.9 F (36.6 C)   TempSrc:   Oral   SpO2: 97% 94% 97% 96%  Weight:      Height:       Eyes: PERRL, lids and conjunctivae normal ENMT: Mucous membranes are moist. Posterior pharynx clear of any exudate or lesions.Normal dentition.  Neck: normal, supple, no masses, no thyromegaly Respiratory: clear to auscultation bilaterally, no wheezing, no crackles. Normal respiratory effort. No accessory muscle use.  Cardiovascular: Regular rate and rhythm, no murmurs / rubs / gallops. No extremity edema. 2+ pedal pulses. No carotid bruits.  Abdomen: no tenderness, no masses palpated. No hepatosplenomegaly. Bowel sounds positive.  Musculoskeletal: no clubbing / cyanosis. No joint deformity upper and lower extremities. Good ROM, no  contractures. Normal muscle tone.  Skin: no rashes, lesions, ulcers. No induration Neurologic: CN 2-12 grossly intact. Sensation intact, DTR normal.  Muscle strength 5/5 on both sides Psychiatric: Normal judgment and insight. Alert and oriented x 3. Normal mood.    Data Reviewed:   There are no new results to review at this time.    Family Communication: None at bedside Primary team communication: Surgical team discussed Thank you very much for involving [place] in the care of your patient.  Author: Emeline General, MD 07/14/2023 11:20 AM  For on call review [email or web address].

## 2023-07-14 NOTE — Progress Notes (Signed)
*  PRELIMINARY RESULTS* Echocardiogram 2D Echocardiogram has been performed.  [name] 07/14/2023, 2:29 PM

## 2023-07-14 NOTE — Progress Notes (Signed)
 Report called to receiving Rn 230.  Patient with no complaints at this time. Will transfer via bed.

## 2023-07-14 NOTE — Progress Notes (Signed)
 PT Cancellation Note  Patient Details Name: [name] MRN: [id number] DOB: 1965/11/19   Cancelled Treatment:    Reason Eval/Treat Not Completed: Pain limiting ability to participate (Patient declined due to pain. He is requesting to hold PT until tomorrow.)  Donna Bernard, PT, MPT  Ina Homes 07/14/2023, 1:57 PM

## 2023-07-14 NOTE — Progress Notes (Addendum)
 CRITICAL CARE     Name: [name] MRN: [id number] DOB: 02/21/1966     LOS: 1   SUBJECTIVE FINDINGS & SIGNIFICANT EVENTS    History of Presenting Illness:   58 yo M with hx of obesity , OSA, , EtOH abuse hx of admission to hospital for ETOH withdrawal.  He came in for elective surgery to have inguinal hernia repair with lysis of adhesions.  He had significant bleeding per surgeon which was unusual and required electrocautery and ligature for hemostasis.  He was brought back with abdominal JP drain and had FFP and platelets ordered due to intraoperative bleeding.  After receiving these blood products patient deteriorated and developed circulatory shock, hypoxemia, swelling worse on lower extremities.  He had code RRT called and was emergently brought to ICU due to shock with hypoxemia.    Ive discussed case with Blood bank - [facility] -Interim charge and Jimmy Picket -Director of blood bank, we discussed patients reaction and there will be additional workup for this anaphylactic reaction vs Transfusion related acute lung injury.    Lines/tubes : Closed System Drain Left LUQ Bulb (JP) 19 Fr. (Active)  Dressing Status Clean, Dry, Intact 07/13/23 1458  Output (mL) 40 mL 07/13/23 1458     Urethral Catheter morgan Double-lumen 16 Fr. (Active)  Site Assessment Clean, Dry, Intact 07/13/23 1458  Output (mL) 175 mL 07/13/23 1458    Microbiology/Sepsis markers: Results for orders placed or performed during the hospital encounter of 07/13/23  MRSA Next Gen by PCR, Nasal     Status: None   Collection Time: 07/14/23  2:06 AM   Specimen: Nasal Mucosa; Nasal Swab  Result Value Ref Range Status   MRSA by PCR Next Gen NOT DETECTED NOT DETECTED Final    Comment: (NOTE) The GeneXpert MRSA Assay (FDA approved for  NASAL specimens only), is one component of a comprehensive MRSA colonization surveillance program. It is not intended to diagnose MRSA infection nor to guide or monitor treatment for MRSA infections. Test performance is not FDA approved in patients less than 28 years old. Performed at [facility], [address] [place], [place] [address]     Anti-infectives:  Anti-infectives (From admission, onward)    Start     Dose/Rate Route Frequency Ordered Stop   07/13/23 1830  piperacillin-tazobactam (ZOSYN) IVPB 3.375 g        3.375 g 12.5 mL/hr over 240 Minutes Intravenous Every 8 hours 07/13/23 1743     07/13/23 1815  cefTRIAXone (ROCEPHIN) 1 g in sodium chloride 0.9 % 100 mL IVPB  Status:  Discontinued        1 g 200 mL/hr over 30 Minutes Intravenous Every 24 hours 07/13/23 1728 07/13/23 1729   07/13/23 0715  ceFAZolin (ANCEF) IVPB 3g/150 mL premix        3 g 300 mL/hr over 30 Minutes Intravenous On call to O.R. 07/13/23 0700 07/13/23 8657        Consults: Treatment Team:  Earline Mayotte, MD    PAST MEDICAL HISTORY   Past Medical History:  Diagnosis Date   Asthma    as a child   Bronchitis 03/2016   pt has recovered from bronchitis   Chickenpox    Depression    Frequent headaches    GERD (gastroesophageal reflux disease)    when I was younger, better now.   Hypertension    Migraines    history of, not now   OSA (obstructive sleep apnea)  SURGICAL HISTORY   Past Surgical History:  Procedure Laterality Date   APPENDECTOMY  1977   HERNIA REPAIR     HYDROCELE EXCISION Right 06/01/2016   Procedure: HYDROCELECTOMY ADULT;  Surgeon: Vanna Scotland, MD;  Location: [facility] ORS;  Service: Urology;  Laterality: Right;   knee Left 1994   KNEE ARTHROSCOPY Right 1992   UMBILICAL HERNIA REPAIR  2013   VASECTOMY N/A 06/01/2016   Procedure: VASECTOMY;  Surgeon: Vanna Scotland, MD;  Location: [facility] ORS;  Service: Urology;  Laterality: N/A;     FAMILY HISTORY    Family History  Problem Relation Age of Onset   Ovarian cancer Mother    Alcohol abuse Father    Hyperlipidemia Father    Hypertension Father    Arthritis Maternal Grandmother    Colon cancer Maternal Grandmother    Ovarian cancer Maternal Grandmother    Breast cancer Maternal Grandmother    Hyperlipidemia Maternal Grandfather    Hypertension Maternal Grandfather    Hyperlipidemia Paternal Grandmother    Heart attack Paternal Grandmother    Hyperlipidemia Paternal Grandfather    Hypertension Paternal Grandfather    Prostate cancer Maternal Uncle    Bladder Cancer Neg Hx    Kidney disease Neg Hx      SOCIAL HISTORY   Social History   Tobacco Use   Smoking status: Every Day    Current packs/day: 0.50    Types: Cigarettes   Smokeless tobacco: Never   Tobacco comments:    March 11 2016  Vaping Use   Vaping status: Never Used  Substance Use Topics   Alcohol use: Yes    Alcohol/week: 4.0 - 6.0 standard drinks of alcohol    Types: 4 - 6 Shots of liquor per week   Drug use: No     MEDICATIONS   Current Medication:  Current Facility-Administered Medications:    0.9 %  sodium chloride infusion (Manually program via Guardrails IV Fluids), , Intravenous, Once, Sakai, Isami, DO, Held at 07/13/23 1900   0.9 %  sodium chloride infusion, 250 mL, Intravenous, Continuous, Judithe Modest, NP, Held at 07/13/23 1900   Chlorhexidine Gluconate Cloth 2 % PADS 6 each, 6 each, Topical, Daily, Rust-Chester, Cecelia Byars, NP   diphenhydrAMINE (BENADRYL) injection 25 mg, 25 mg, Intravenous, Q6H PRN, [name] D, NP   docusate sodium (COLACE) capsule 100 mg, 100 mg, Oral, BID PRN, Sakai, Isami, DO   EPINEPHrine (ADRENALIN) 5 mg in NS 250 mL (0.02 mg/mL) premix infusion, 0.5-20 mcg/min, Intravenous, Titrated, Vida Rigger, MD, Stopped at 07/13/23 1831   famotidine (PEPCID) IVPB 20 mg premix, 20 mg, Intravenous, Q12H, [name] D, NP, Stopped at 07/13/23 1950   fentaNYL  (SUBLIMAZE) injection 25-50 mcg, 25-50 mcg, Intravenous, Q2H PRN, [name] D, NP, 50 mcg at 07/14/23 3474   HYDROcodone-acetaminophen (NORCO/VICODIN) 5-325 MG per tablet 1-2 tablet, 1-2 tablet, Oral, Q4H PRN, Tonna Boehringer, Isami, DO   methylPREDNISolone sodium succinate (SOLU-MEDROL) 40 mg/mL injection 40 mg, 40 mg, Intravenous, Q12H, [name] D, NP, 40 mg at 07/14/23 0349   midazolam (VERSED) injection 2 mg, 2 mg, Intravenous, Q4H PRN, [name] D, NP, 2 mg at 07/13/23 1642   morphine (PF) 2 MG/ML injection 2 mg, 2 mg, Intravenous, Q4H PRN, Sakai, Isami, DO, 2 mg at 07/14/23 0636   norepinephrine (LEVOPHED) 4mg  in (0.016 mg/mL) premix infusion, 2-10 mcg/min, Intravenous, Titrated, [name] D, NP, Stopped at 07/13/23 1830   ondansetron (ZOFRAN-ODT) disintegrating tablet 4 mg, 4 mg, Oral,  Q6H PRN **OR** ondansetron (ZOFRAN) injection 4 mg, 4 mg, Intravenous, Q6H PRN, Sakai, Isami, DO, 4 mg at 07/13/23 2156   Oral care mouth rinse, 15 mL, Mouth Rinse, PRN, Rust-Chester, Micheline Rough L, NP   piperacillin-tazobactam (ZOSYN) IVPB 3.375 g, 3.375 g, Intravenous, Q8H, Lowella Bandy, RPH, Last Rate: 12.5 mL/hr at 07/14/23 0508, 3.375 g at 07/14/23 0508   sodium chloride flush (NS) 0.9 % injection 10-40 mL, 10-40 mL, Intracatheter, Q12H, Rust-Chester, Micheline Rough L, NP, 10 mL at 07/13/23 2055   sodium chloride flush (NS) 0.9 % injection 10-40 mL, 10-40 mL, Intracatheter, PRN, Rust-Chester, Cecelia Byars, NP   traMADol (ULTRAM) tablet 50 mg, 50 mg, Oral, Q6H PRN, Sakai, Isami, DO    ALLERGIES   Sulfa antibiotics    REVIEW OF SYSTEMS     10 point ROS done and is + for pruritus, headache , dyspnea, otherwise as per subjective findings  PHYSICAL EXAMINATION   Vital Signs: Temp:  [97.1 F (36.2 C)-98.7 F (37.1 C)] 97.7 F (36.5 C) (03/19 0400) Pulse Rate:  [63-109] 74 (03/19 0800) Resp:  [7-31] 21 (03/19 0800) BP: (80-150)/(52-90) 139/71 (03/19 0800) SpO2:  [88 %-100 %] 97 %  (03/19 0800)  GENERAL:Mild distress and confusion  HEAD: Normocephalic, atraumatic.  EYES: Pupils equal, round, reactive to light.  No scleral icterus.  MOUTH: Moist mucosal membrane. NECK: Supple. No thyromegaly. No nodules. No JVD.  PULMONARY: wheezing and rhonchi bilaterally  CARDIOVASCULAR: S1 and S2. Regular rate and rhythm. No murmurs, rubs, or gallops.  GASTROINTESTINAL: Soft, post surgery- tender without peritoneal signs, distended. No masses. Positive bowel sounds. No hepatosplenomegaly.  MUSCULOSKELETAL: No swelling, clubbing, or edema.  NEUROLOGIC: Mild distress due to acute illness SKIN:intact,warm,dry   PERTINENT DATA     Infusions:  sodium chloride Stopped (07/13/23 1900)   epinephrine Stopped (07/13/23 1831)   famotidine (PEPCID) IV Stopped (07/13/23 1950)   norepinephrine (LEVOPHED) Adult infusion Stopped (07/13/23 1830)   piperacillin-tazobactam (ZOSYN)  IV 3.375 g (07/14/23 0508)   Scheduled Medications:  sodium chloride   Intravenous Once   Chlorhexidine Gluconate Cloth  6 each Topical Daily   methylPREDNISolone (SOLU-MEDROL) injection  40 mg Intravenous Q12H   sodium chloride flush  10-40 mL Intracatheter Q12H   PRN Medications: diphenhydrAMINE, docusate sodium, fentaNYL (SUBLIMAZE) injection, HYDROcodone-acetaminophen, midazolam, morphine injection, ondansetron **OR** ondansetron (ZOFRAN) IV, mouth rinse, sodium chloride flush, traMADol Hemodynamic parameters:   Intake/Output: 03/18 0701 - 03/19 0700 In: 2978.5 [P.O.:30; I.V.:2149.6; Blood:419; IV Piggyback:379.9] Out: 1655 [Urine:1150; Drains:305; Blood:200]  Ventilator  Settings:     LAB RESULTS:  Basic Metabolic Panel: Recent Labs  Lab 07/13/23 1533 07/14/23 0358  NA 137 135  K 4.5 4.5  CL 108 108  CO2 22 21*  GLUCOSE 155* 149*  BUN 16 23*  CREATININE 1.10 1.29*  CALCIUM 8.6* 8.5*  MG  --  1.6*  PHOS  --  3.1   Liver Function Tests: Recent Labs  Lab 07/13/23 1533 07/14/23 0358   AST 33  --   ALT 17  --   ALKPHOS 79  --   BILITOT 2.0*  --   PROT 5.9*  --   ALBUMIN 2.7* 2.9*   No results for input(s): "LIPASE", "AMYLASE" in the last 168 hours. No results for input(s): "AMMONIA" in the last 168 hours. CBC: Recent Labs  Lab 07/13/23 1205 07/13/23 1533 07/13/23 1651 07/13/23 2204 07/14/23 0358  WBC 8.5 5.7 15.5*  --  10.5  NEUTROABS  --   --  13.9*  --   --  HGB 13.7 14.7 14.5 13.9 13.0  HCT 39.4 42.6 41.4 40.0 38.1*  MCV 102.3* 100.5* 101.5*  --  102.7*  PLT 94* 142* 147*  --  116*   Cardiac Enzymes: No results for input(s): "CKTOTAL", "CKMB", "CKMBINDEX", "TROPONINI" in the last 168 hours. BNP: Invalid input(s): "POCBNP" CBG: Recent Labs  Lab 07/13/23 1527  GLUCAP 157*       IMAGING RESULTS:      ASSESSMENT AND PLAN    -Multidisciplinary rounds held today  Acute Hypoxic Respiratory Failure -CXR with notable blunting of cardiophrenic and costophrenic angles.   -patient developed acute hypoxemia post operatively and post blood product transfusion  - s/p treatment with improvement of edema - now mild upper and lower extremity edema 1+, will continue lasix with 40mg  delivered via IV today - will continue sterods via solumedrol IV tapering protocol -Currently patient is afebrile reports marked improvement and asking for regular diet to be ordered -BNP normal -TTE ordered  -currently on room air with incentive spirometry    Alcoholism history   - previous admission for ETOH withdrawal-states he has not drank in many months   - CIWA protocol    - thiamine and folate      Post inguinal hernia and lysis of adhesion surgery   - reviewed in detail with surgeon Dr Tonna Boehringer - JP drain slowing down, h/h stable     - planning to re-image when more clnically stable   ID -continue IV abx as prescibed -follow up cultures  GI/Nutrition GI PROPHYLAXIS as indicated DIET-->TF's as tolerated Constipation protocol as indicated  ENDO - ICU  hypoglycemic\Hyperglycemia protocol -check FSBS per protocol   ELECTROLYTES -follow labs as needed -replace as needed -pharmacy consultation   DVT/GI PRX ordered -SCDs  TRANSFUSIONS AS NEEDED MONITOR FSBS ASSESS the need for LABS as needed    Critical care provider statement:   Total critical care time: 32  minutes   Performed by: Karna Christmas MD   Critical care time was exclusive of separately billable procedures and treating other patients.   Critical care was necessary to treat or prevent imminent or life-threatening deterioration.   Critical care was time spent personally by me on the following activities: development of treatment plan with patient and/or surrogate as well as nursing, discussions with consultants, evaluation of patient's response to treatment, examination of patient, obtaining history from patient or surrogate, ordering and performing treatments and interventions, ordering and review of laboratory studies, ordering and review of radiographic studies, pulse oximetry and re-evaluation of patient's condition.    Vida Rigger, M.D.  Pulmonary & Critical Care Medicine

## 2023-07-15 DIAGNOSIS — K439 Ventral hernia without obstruction or gangrene: Secondary | ICD-10-CM | POA: Diagnosis not present

## 2023-07-15 DIAGNOSIS — J9584 Transfusion-related acute lung injury (TRALI): Secondary | ICD-10-CM | POA: Diagnosis not present

## 2023-07-15 DIAGNOSIS — T782XXA Anaphylactic shock, unspecified, initial encounter: Secondary | ICD-10-CM | POA: Insufficient documentation

## 2023-07-15 LAB — RENAL FUNCTION PANEL
Albumin: 3 g/dL — ABNORMAL LOW (ref 3.5–5.0)
Anion gap: 5 (ref 5–15)
BUN: 30 mg/dL — ABNORMAL HIGH (ref 6–20)
CO2: 24 mmol/L (ref 22–32)
Calcium: 8.6 mg/dL — ABNORMAL LOW (ref 8.9–10.3)
Chloride: 106 mmol/L (ref 98–111)
Creatinine, Ser: 1.13 mg/dL (ref 0.61–1.24)
GFR, Estimated: >60 mL/min (ref >60)
Glucose, Bld: 142 mg/dL — ABNORMAL HIGH (ref 70–99)
Phosphorus: 3.4 mg/dL (ref 2.5–4.6)
Potassium: 4.7 mmol/L (ref 3.5–5.1)
Sodium: 135 mmol/L (ref 135–145)

## 2023-07-15 LAB — CBC
HCT: 37.8 % — ABNORMAL LOW (ref 39.0–52.0)
Hemoglobin: 13.1 g/dL (ref 13.0–17.0)
MCH: 35.4 pg — ABNORMAL HIGH (ref 26.0–34.0)
MCHC: 34.7 g/dL (ref 30.0–36.0)
MCV: 102.2 fL — ABNORMAL HIGH (ref 80.0–100.0)
Platelets: 116 10*3/uL — ABNORMAL LOW (ref 150–400)
RBC: 3.7 MIL/uL — ABNORMAL LOW (ref 4.22–5.81)
RDW: 14.5 % (ref 11.5–15.5)
WBC: 15.2 10*3/uL — ABNORMAL HIGH (ref 4.0–10.5)
nRBC: 0 % (ref 0.0–0.2)

## 2023-07-15 LAB — HAPTOGLOBIN: Haptoglobin: 92 mg/dL (ref 29–370)

## 2023-07-15 LAB — MAGNESIUM: Magnesium: 2.3 mg/dL (ref 1.7–2.4)

## 2023-07-15 MED ORDER — PREDNISONE 20 MG PO TABS
40.0000 mg | ORAL_TABLET | Freq: Every day | ORAL | Status: DC
  Administered 2023-07-15 – 2023-07-16 (×2): 40 mg via ORAL
  Filled 2023-07-15 (×2): qty 2

## 2023-07-15 MED ORDER — FAMOTIDINE 20 MG PO TABS
20.0000 mg | ORAL_TABLET | Freq: Two times a day (BID) | ORAL | Status: DC
Start: 2023-07-15 — End: 2023-07-17
  Administered 2023-07-15: 20 mg via ORAL
  Filled 2023-07-15 (×3): qty 1

## 2023-07-15 MED ORDER — SENNOSIDES-DOCUSATE SODIUM 8.6-50 MG PO TABS
2.0000 | ORAL_TABLET | Freq: Two times a day (BID) | ORAL | Status: DC
  Administered 2023-07-15 – 2023-07-16 (×4): 2 via ORAL
  Filled 2023-07-15 (×4): qty 2

## 2023-07-15 MED ORDER — AMOXICILLIN-POT CLAVULANATE 875-125 MG PO TABS
1.0000 | ORAL_TABLET | Freq: Two times a day (BID) | ORAL | Status: DC
  Administered 2023-07-15 – 2023-07-16 (×4): 1 via ORAL
  Filled 2023-07-15 (×5): qty 1

## 2023-07-15 MED ORDER — MELATONIN 5 MG PO TABS
5.0000 mg | ORAL_TABLET | Freq: Every day | ORAL | Status: DC
  Administered 2023-07-15 – 2023-07-16 (×2): 5 mg via ORAL
  Filled 2023-07-15 (×2): qty 1

## 2023-07-15 NOTE — Evaluation (Signed)
 Occupational Therapy Evaluation Patient Details Name: [name] MRN: [id number] DOB: July 23, 1965 Today's Date: 07/15/2023   History of Present Illness   Pt is a 58 yo M came in for elective surgery to have inguinal hernia repair with lysis of adhesions.  He had significant bleeding per surgeon which was unusual and required electrocautery and ligature for hemostasis.  He was brought back with abdominal JP drain and had FFP and platelets ordered due to intraoperative bleeding.  After receiving these blood products patient deteriorated and developed circulatory shock, hypoxemia, swelling worse on lower extremities.  He had code RRT called and was emergently brought to ICU due to shock with hypoxemia.   PMH of obesity , OSA, EtOH abuse and ETOH withdrawal.     Clinical Impressions Pt was seen for PT/OT co-evaluation this date to maximize pt/therapist safety. PTA, pt was living at home with his daughter, brother and sister. He was assisting in his sister's care, lifting her, etc. He was IND prior to his hernia and able to perform all ADLs and mobility with no AD, drive, grocery shop, lift 50# dog food bags, etc.   Pt presents to acute OT demonstrating impaired ADL performance and functional mobility 2/2 weakness, pain, and limited activity tolerance. Pt currently requires CGA for bed mobility, STS and short distance mobility in the room. Pain is most limiting factor 9/10 during session and asking for pain meds at end of session. Min A for LB dressing via figure four. Required elevated bed height to stand, recommend BSC for home. Pt would benefit from skilled OT services to address noted impairments and functional limitations (see below for any additional details) in order to maximize safety and independence while minimizing falls risk and caregiver burden. Do anticipate the need for follow up OT services upon acute hospital DC.       If plan is discharge home, recommend the following:   A lot of  help with bathing/dressing/bathroom;A little help with bathing/dressing/bathroom;Help with stairs or ramp for entrance;Assist for transportation;Assistance with cooking/housework     Functional Status Assessment   Patient has had a recent decline in their functional status and demonstrates the ability to make significant improvements in function in a reasonable and predictable amount of time.     Equipment Recommendations   BSC/3in1;Other (comment) (bari RW)     Recommendations for Other Services         Precautions/Restrictions   Precautions Precautions: Fall Restrictions Weight Bearing Restrictions Per Provider Order: No     Mobility Bed Mobility Overal bed mobility: Needs Assistance Bed Mobility: Supine to Sit, Sit to Supine     Supine to sit: Contact guard, HOB elevated, Used rails Sit to supine: Contact guard assist   General bed mobility comments: no physical assist, but cueing and increased time/effort to perform    Transfers Overall transfer level: Needs assistance Equipment used: Rolling walker (2 wheels) Transfers: Sit to/from Stand Sit to Stand: From elevated surface           General transfer comment: STS from elevated bed height with CGA/SBA to RW and ambulated within the room using RW with CGA      Balance Overall balance assessment: Needs assistance   Sitting balance-Leahy Scale: Good Sitting balance - Comments: able to perform figure four to don L sock at EOB     Standing balance-Leahy Scale: Fair Standing balance comment: RW use and CGA  ADL either performed or assessed with clinical judgement   ADL Overall ADL's : Needs assistance/impaired                     Lower Body Dressing: Minimal assistance Lower Body Dressing Details (indicate cue type and reason): able to don L sock via figure four, but required increased work of breathing and HR up to 117; assisted pt to don R sock              Functional mobility during ADLs: Contact guard assist;Rolling walker (2 wheels)       Vision         Perception         Praxis         Pertinent Vitals/Pain Pain Assessment Pain Assessment: 0-10 Pain Score: 9  Pain Location: abdomen; migraine Pain Descriptors / Indicators: Sore Pain Intervention(s): Monitored during session     Extremity/Trunk Assessment Upper Extremity Assessment Upper Extremity Assessment: Overall WFL for tasks assessed   Lower Extremity Assessment Lower Extremity Assessment: Generalized weakness       Communication Communication Communication: No apparent difficulties   Cognition Arousal: Alert Behavior During Therapy: WFL for tasks assessed/performed Cognition: No apparent impairments                               Following commands: Intact       Cueing  General Comments      JP drain, IV and catheter intact pre/post session   Exercises Other Exercises Other Exercises: Edu on role of OT in acute setting.   Shoulder Instructions      Home Living Family/patient expects to be discharged to:: Private residence Living Arrangements: Children;Other relatives (his brother, sister, daughter) Available Help at Discharge: Family Type of Home: House Home Access: Stairs to enter [employer] of Steps: 6 STE steep, wide rails Entrance Stairs-Rails: Right;Left Home Layout: One level     Bathroom Shower/Tub: Walk-in shower             Additional Comments: no equipment of his own; aunt and uncle have lots of equipment      Prior Functioning/Environment Prior Level of Function : Independent/Modified Independent;Driving             Mobility Comments: IND community and household distances ADLs Comments: IND, driving, lifting 16# bags of dog food; caregiver to sister/brother; cousin and daughter also assist    OT Problem List: Decreased strength;Pain;Decreased activity tolerance   OT  Treatment/Interventions: Self-care/ADL training;Therapeutic exercise;Therapeutic activities;Energy conservation;DME and/or AE instruction;Patient/family education;Balance training      OT Goals(Current goals can be found in the care plan section)   Acute Rehab OT Goals Patient Stated Goal: return home OT Goal Formulation: With patient/family Time For Goal Achievement: 07/29/23 Potential to Achieve Goals: Good ADL Goals Pt Will Perform Lower Body Bathing: with supervision;sit to/from stand;sitting/lateral leans;with contact guard assist Pt Will Perform Lower Body Dressing: with supervision;sitting/lateral leans;sit to/from stand;with adaptive equipment Pt Will Transfer to Toilet: with supervision;regular height toilet;ambulating;bedside commode Pt Will Perform Toileting - Clothing Manipulation and hygiene: with supervision;with modified independence;sitting/lateral leans;sit to/from stand   OT Frequency:  Min 3X/week    Co-evaluation PT/OT/SLP Co-Evaluation/Treatment: Yes Reason for Co-Treatment: For patient/therapist safety PT goals addressed during session: Mobility/safety with mobility OT goals addressed during session: ADL's and self-care      AM-PAC OT "6 Clicks" Daily Activity     Outcome Measure Help  from another person eating meals?: None Help from another person taking care of personal grooming?: A Little Help from another person toileting, which includes using toliet, bedpan, or urinal?: A Little Help from another person bathing (including washing, rinsing, drying)?: A Little Help from another person to put on and taking off regular upper body clothing?: None Help from another person to put on and taking off regular lower body clothing?: A Little 6 Click Score: 20   End of Session Equipment Utilized During Treatment: Rolling walker (2 wheels) Nurse Communication: Mobility status  Activity Tolerance: Patient tolerated treatment well Patient left: in bed;with call  bell/phone within reach;with bed alarm set;with family/visitor present  OT Visit Diagnosis: Other abnormalities of gait and mobility (R26.89);Muscle weakness (generalized) (M62.81);Pain                Time: 1343-1410 OT Time Calculation (min): 27 min Charges:  OT General Charges $OT Visit: 1 Visit OT Evaluation $OT Eval Moderate Complexity: 1 Mod Shalimar Mcclain, OTR/L 07/15/23, 3:25 PM  Constance Goltz 07/15/2023, 2:43 PM

## 2023-07-15 NOTE — Progress Notes (Signed)
  Progress Note   Patient: [name] XBJ:[id number] DOB: [birth year] DOA: 07/13/2023     2 DOS: the patient was seen and examined on 07/15/2023   Brief hospital course: LEEUM SANKEY is a 58 y.o. male with past medical history of morbid obesity, mild intermittent asthma, right inguinal hernia, GERD, OSA, migraines, who was admitted for elective right inguinal hernia repair, but during early stage of the procedure, it was found patient uncontrolled bleeding from the surgical site and surgery was aborted.  Patient was given FFP and platelet transfusion, however soon patient started develop tingling and burning sensation of bilateral feet ascending to bilateral calfs and thighs abdomen and chest wall and bilateral arms with hives and feeling of " hot all over" and soon patient passed out.  He was seen by ICU, appears to have anaphylactic reaction to plasma transfusion, also had significant hypoxemia. Patient received Solu-Medrol, IV Lasix.  Has been stabilized. General surgery request hospitalist take over patient care.   Principal Problem:   Ventral hernia without obstruction or gangrene Active Problems:   Inguinal hernia   Assessment and Plan: Seizure-like activity versus syncope Anaphylactic reaction Anaphylactic shock Condition has resolved.     Acute hypoxic respiratory failure -TRALI was suspected and patient received IV Lasix x 2 and now back to nasal cannula. Patient no longer has any short of breath.  He has been treated with IV steroids, will continue oral prednisone for 2 days.   Benign prostate hypertrophy. Pyuria. Patient urine culture has no growth, patient will be treated with 2 days of Zosyn, will continue 3 days with Augmentin.   Right inguinal hernia Surgery.  Generalized weakness. Patient has been very weak, obtain PT/OT.  Class III morbid obesity with BMI of 49.53. Diet and exercise.     Subjective:  Patient has been complaining of pain inside of the  abdominal incision site.  Very weak.  Physical Exam: Vitals:   07/14/23 1857 07/14/23 1953 07/15/23 0428 07/15/23 0849  BP: 132/66 115/68 (!) 150/84 (!) 170/90  Pulse: 94 84 86 81  Resp: 20 18 16 18   Temp: 98 F (36.7 C) 97.8 F (36.6 C) 97.6 F (36.4 C) 97.7 F (36.5 C)  TempSrc: Oral     SpO2: 97% 97% 96% 97%  Weight:   (!) 175 kg   Height:       General exam: Appears calm and comfortable  Respiratory system: Clear to auscultation. Respiratory effort normal. Cardiovascular system: S1 & S2 heard, RRR. No JVD, murmurs, rubs, gallops or clicks. No pedal edema. Gastrointestinal system: Abdomen is nondistended, soft and nontender. No organomegaly or masses felt. Normal bowel sounds heard. Central nervous system: Alert and oriented. No focal neurological deficits. Extremities: Symmetric 5 x 5 power. Skin: No rashes, lesions or ulcers Psychiatry: Judgement and insight appear normal. Mood & affect appropriate.    Data Reviewed:  Reviewed x-rays and lab results.  Family Communication: none  Disposition: Status is: Inpatient Remains inpatient appropriate because: Severity of disease.     Time spent: 35 minutes  Author: Marrion Coy, MD 07/15/2023 12:57 PM  For on call review [email or web address].

## 2023-07-15 NOTE — Evaluation (Signed)
 Physical Therapy Evaluation Patient Details Name: [name] MRN: [id number] DOB: 17-Jan-1966 Today's Date: 07/15/2023  History of Present Illness  Pt is a 58 yo M came in for elective surgery to have inguinal hernia repair with lysis of adhesions.  He had significant bleeding per surgeon which was unusual and required electrocautery and ligature for hemostasis.  He was brought back with abdominal JP drain and had FFP and platelets ordered due to intraoperative bleeding.  After receiving these blood products patient deteriorated and developed circulatory shock, hypoxemia, swelling worse on lower extremities.  He had code RRT called and was emergently brought to ICU due to shock with hypoxemia.   PMH of obesity , OSA, EtOH abuse and ETOH withdrawal.  Clinical Impression  PT/OT co eval performed. Pt is a pleasant 58 year old male who was admitted for ventral hernia repair with complicated hospital stay. Pt with migraine, however agreeable to participation. Pt performs bed mobility, transfers, and ambulation with cga and bari RW. Pt demonstrates deficits with endurance/mobility/pain. 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 Pt is very motivated to participate and will have daughter support. Is hopeful with continued mobility, he will be able to dc home. Would benefit from skilled PT to address above deficits and promote optimal return to PLOF. Pt will continue to receive skilled PT services while admitted and will defer to TOC/care team for updates regarding disposition planning.        If plan is discharge home, recommend the following: A lot of help with walking and/or transfers;A lot of help with bathing/dressing/bathroom;Assist for transportation;Help with stairs or ramp for entrance   Can travel by private vehicle        Equipment Recommendations  (bari RW and bari [facility])  Recommendations for Other Services       Functional Status Assessment  Patient has had a recent decline in their functional status and demonstrates the ability to make significant improvements in function in a reasonable and predictable amount of time.     Precautions / Restrictions Precautions Precautions: Fall Recall of Precautions/Restrictions: Intact Restrictions Weight Bearing Restrictions Per Provider Order: No      Mobility  Bed Mobility Overal bed mobility: Needs Assistance Bed Mobility: Supine to Sit, Sit to Supine     Supine to sit: Contact guard, HOB elevated, Used rails Sit to supine: Contact guard assist   General bed mobility comments: becomes SOB with exertion. Once seated at EOB, HR elevates to 119bpm. O2 sats WNL on RA.    Transfers Overall transfer level: Needs assistance Equipment used: Rolling walker (2 wheels) Transfers: Sit to/from Stand             General transfer comment: able to stand from elevated bed and use of bari RW    Ambulation/Gait Ambulation/Gait assistance: Contact guard assist Gait Distance (Feet): 10 Feet Assistive device: Rolling walker (2 wheels) Gait Pattern/deviations: Step-to pattern       General Gait Details: limited gait in room and increased SOB symptoms. Very effortful. +2 for lines/leads.  Stairs            Wheelchair Mobility     Tilt Bed    Modified Rankin (Stroke Patients Only)       Balance Overall balance assessment: Needs assistance Sitting-balance support: Feet supported Sitting balance-Leahy Scale: Good     Standing balance support: Bilateral upper extremity supported Standing balance-Leahy Scale: Fair  Pertinent Vitals/Pain Pain Assessment Pain Assessment: 0-10 Pain Score: 9  Pain Location: abdomen; migraine Pain Descriptors / Indicators: Sore Pain Intervention(s): Limited activity within patient's tolerance, Premedicated before session    Home Living Family/patient expects to be discharged to:: Private  residence Living Arrangements: Children;Other relatives Available Help at Discharge: Family Type of Home: House Home Access: Stairs to enter Entrance Stairs-Rails: Doctor, general practice of Steps: 6 STE steep, wide rails   Home Layout: One level Home Equipment: None Additional Comments: no equipment of his own; aunt and uncle have lots of equipment. Pt reports stairs aren't very safe to navigate and need repairs.    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; cousin and daughter also assist     Extremity/Trunk Assessment   Upper Extremity Assessment Upper Extremity Assessment: Overall WFL for tasks assessed    Lower Extremity Assessment Lower Extremity Assessment: Generalized weakness (B LE grossly 3+/5 with noted edema present)       Communication   Communication Communication: No apparent difficulties    Cognition Arousal: Alert Behavior During Therapy: WFL for tasks assessed/performed   PT - Cognitive impairments: No apparent impairments                       PT - Cognition Comments: hesitant upon arrival due to migraine, however agreeable Following commands: Intact       Cueing Cueing Techniques: Verbal cues     General Comments General comments (skin integrity, edema, etc.): JP drain, IV and catheter intact pre/post session    Exercises     Assessment/Plan    PT Assessment Patient needs continued PT services  PT Problem List Decreased strength;Decreased balance;Decreased mobility;Decreased knowledge of use of DME;Cardiopulmonary status limiting activity;Pain;Obesity       PT Treatment Interventions DME instruction;Gait training;Therapeutic exercise;Therapeutic activities;Stair training    PT Goals (Current goals can be found in the  Care Plan section)  Acute Rehab PT Goals Patient Stated Goal: to go home PT Goal Formulation: With patient Time For Goal Achievement: 07/29/23 Potential to Achieve Goals: Good    Frequency Min 2X/week     Co-evaluation PT/OT/SLP Co-Evaluation/Treatment: Yes Reason for Co-Treatment: For patient/therapist safety PT goals addressed during session: Mobility/safety with mobility OT goals addressed during session: ADL's and self-care       AM-PAC PT "6 Clicks" Mobility  Outcome Measure Help needed turning from your back to your side while in a flat bed without using bedrails?: A Little Help needed moving from lying on your back to sitting on the side of a flat bed without using bedrails?: A Little Help needed moving to and from a bed to a chair (including a wheelchair)?: A Lot Help needed standing up from a chair using your arms (e.g., wheelchair or bedside chair)?: A Little Help needed to walk in hospital room?: A Lot Help needed climbing 3-5 steps with a railing? : A Lot 6 Click Score: 15    End of Session   Activity Tolerance: Patient tolerated treatment well Patient left: in bed Nurse Communication: Mobility status PT Visit Diagnosis: Muscle weakness (generalized) (M62.81);Unsteadiness on feet (R26.81);Difficulty in walking, not elsewhere classified (R26.2);Pain Pain - Right/Left:  (central abdomen) Pain - part of body:  (abdomen)    Time: 1342-1410 PT Time Calculation (min) (ACUTE  ONLY): 28 min   Charges:   PT Evaluation $PT Eval Low Complexity: 1 Low   PT General Charges $$ ACUTE PT VISIT: 1 Visit         Elizabeth Palau, PT, DPT, GCS [phone]   Elbert Polyakov 07/15/2023, 4:42 PM

## 2023-07-15 NOTE — Progress Notes (Signed)
 Subjective:  CC: [name] is a 58 y.o. male  Hospital stay day 2, 2 Days Post-Op exploratory laparotomy for aborted inguinal hernia repair surgery due to bleeding issues  HPI: No acute issues overnight.  ROS:  General: Denies weight loss, weight gain, fatigue, fevers, chills, and night sweats. Heart: Denies chest pain, palpitations, racing heart, irregular heartbeat, leg pain or swelling, and decreased activity tolerance. Respiratory: Denies breathing difficulty, shortness of breath, wheezing, cough, and sputum. GI: Denies change in appetite, heartburn, nausea, vomiting, constipation, diarrhea, and blood in stool. GU: Denies difficulty urinating, pain with urinating, urgency, frequency, blood in urine.   Objective:   Temp:  [97.6 F (36.4 C)-98.3 F (36.8 C)] 97.7 F (36.5 C) (03/20 0849) Pulse Rate:  [81-101] 81 (03/20 0849) Resp:  [14-20] 18 (03/20 0849) BP: (111-170)/(45-90) 170/90 (03/20 0849) SpO2:  [96 %-97 %] 97 % (03/20 0849) Weight:  [175 kg] 175 kg (03/20 0428)     Height: 6\' 2"  (188 cm) Weight: (!) 175 kg BMI (Calculated): 49.51   Intake/Output this shift:   Intake/Output Summary (Last 24 hours) at 07/15/2023 1026 Last data filed at 07/15/2023 0501 Gross per 24 hour  Intake 729.94 ml  Output 2380 ml  Net -1650.06 ml    Constitutional :  alert, cooperative, appears stated age, and no distress  Respiratory:  clear to auscultation bilaterally  Cardiovascular:  regular rate and rhythm  Gastrointestinal: Soft, no guarding, TTP around staple line, with swelling consistent with likely hematoma . JP with lightening serosanguinous drainage.  Skin: Cool and moist.   Psychiatric: Normal affect, non-agitated, not confused       LABS:     Latest Ref Rng & Units 07/15/2023    6:10 AM 07/14/2023    3:58 AM 07/13/2023    3:33 PM  CMP  Glucose 70 - 99 mg/dL 875  643  329   BUN 6 - 20 mg/dL 30  23  16    Creatinine 0.61 - 1.24 mg/dL 5.18  8.41  6.60   Sodium 135 - 145  mmol/L 135  135  137   Potassium 3.5 - 5.1 mmol/L 4.7  4.5  4.5   Chloride 98 - 111 mmol/L 106  108  108   CO2 22 - 32 mmol/L 24  21  22    Calcium 8.9 - 10.3 mg/dL 8.6  8.5  8.6   Total Protein 6.5 - 8.1 g/dL   5.9   Total Bilirubin 0.0 - 1.2 mg/dL   2.0   Alkaline Phos 38 - 126 U/L   79   AST 15 - 41 U/L   33   ALT 0 - 44 U/L   17       Latest Ref Rng & Units 07/15/2023    6:10 AM 07/14/2023    3:58 AM 07/13/2023   10:04 PM  CBC  WBC 4.0 - 10.5 K/uL 15.2  10.5    Hemoglobin 13.0 - 17.0 g/dL 63.0  16.0  10.9   Hematocrit 39.0 - 52.0 % 37.8  38.1  40.0   Platelets 150 - 400 K/uL 116  116      RADS: N/a Assessment:   S/p exploratory laparotomy for aborted inguinal hernia repair surgery due to bleeding issues.  No issues from surgery standpoint.  Appreciate hospitalist care re: post operative course.  Will consider referral to tertiary care center re: reattempt at hernia repair due to complicated post op course  labs/images/medications/previous chart entries reviewed personally and relevant changes/updates  noted above.

## 2023-07-15 NOTE — Hospital Course (Addendum)
 58 yo M with hx of obesity , OSA, , EtOH abuse hx of admission to hospital for ETOH withdrawal. He came in for elective surgery to have inguinal hernia repair with lysis of adhesions. He had significant bleeding per surgeon which was unusual and required electrocautery and ligature for hemostasis. He was brought back with abdominal JP drain and had FFP and platelets ordered due to intraoperative bleeding. After receiving these blood products patient deteriorated and developed circulatory shock, hypoxemia, swelling worse on lower extremities. He had code RRT called and was emergently brought to ICU due to shock with hypoxemia. He was treated for anaphylactic non febrile blood product reaction vs TRALI and improved overnight. He became stable and was downgraded to medical floor.   3/18 patient had bilateral inguinal hernia repair plus lysis of adhesions and exploratory laparotomy. 3/22.  Patient was brought to the operating room by Dr. Aleen Campi for fascial dehiscence with small bowel evisceration and handout exploratory laparotomy with abdominal washout and abdominal closure with retention sutures.  07/17/23- patient had code RRT overnight, he had findings of small bowel evisceration secondary to fascial dehiscence.  This likely stemmed from forceful cough per surgery service.  He became unstable in distress and required endotracheal intubation.  He was taken to OR emergently for ex-lap and closure of abdominal cavity.  Daughter was available to come in and we reviewed medical plan. CVl placed   07/18/23- extubated but reports severe pain. Daughter at bedside and patient is improved able to speak in full sentences.  Reviewed case with surgery and placed abdominal binder.  He is on CIWA due to reported daily drinking.    3/24 lethargic but arousable, remains  on pressors   3/25 remains on pressors, foley in place.  Central line removed and PICC line placed  3/26.  Medical team took over.  Foley catheter  removed.  Patient spiked a fever of 101.  Blood cultures, chest x-ray, urine analysis and urine culture (after Foley removed).  Patient already on fluconazole and meropenem. 3/27.  Since MRSA PCR came back negative vancomycin was discontinued.  NG tube came out and needed to be replaced. 3/28.  Patient started on clear liquid diet.  Still has NG tube. 3/29.  Will continue IV Lasix on a daily basis with weight gain up to 395 pounds.  Patient states that he normally is around 350. 3/30.  Right arm swelling.  Ultrasound showed no DVT.  PICC line removed.  TPN stopped.  Started Lovenox injections. 3/31.  CT scan of the abdomen pelvis shows postoperative ileus.  CT scan of the chest shows pneumonia.  Still on meropenem.  Nystatin powder under skin folds.

## 2023-07-15 NOTE — Progress Notes (Signed)
 PHARMACIST - PHYSICIAN COMMUNICATION  DR:   Chipper Herb  CONCERNING: IV to Oral Route Change Policy  RECOMMENDATION: This patient is receiving famotidine by the intravenous route.  Based on criteria approved by the Pharmacy and Therapeutics Committee, the intravenous medication(s) is/are being converted to the equivalent oral dose form(s).   DESCRIPTION: These criteria include: The patient is eating (either orally or via tube) and/or has been taking other orally administered medications for a least 24 hours The patient has no evidence of active gastrointestinal bleeding or impaired GI absorption (gastrectomy, short bowel, patient on TNA or NPO).  If you have questions about this conversion, please contact the Pharmacy Department    Barrie Folk, [facility] 07/15/2023 10:10 AM

## 2023-07-16 DIAGNOSIS — K439 Ventral hernia without obstruction or gangrene: Secondary | ICD-10-CM | POA: Diagnosis not present

## 2023-07-16 DIAGNOSIS — J9584 Transfusion-related acute lung injury (TRALI): Secondary | ICD-10-CM | POA: Diagnosis not present

## 2023-07-16 DIAGNOSIS — T782XXA Anaphylactic shock, unspecified, initial encounter: Secondary | ICD-10-CM | POA: Diagnosis not present

## 2023-07-16 LAB — CBC
HCT: 38.7 % — ABNORMAL LOW (ref 39.0–52.0)
Hemoglobin: 13.2 g/dL (ref 13.0–17.0)
MCH: 34.5 pg — ABNORMAL HIGH (ref 26.0–34.0)
MCHC: 34.1 g/dL (ref 30.0–36.0)
MCV: 101 fL — ABNORMAL HIGH (ref 80.0–100.0)
Platelets: 119 10*3/uL — ABNORMAL LOW (ref 150–400)
RBC: 3.83 MIL/uL — ABNORMAL LOW (ref 4.22–5.81)
RDW: 14.3 % (ref 11.5–15.5)
WBC: 12.8 10*3/uL — ABNORMAL HIGH (ref 4.0–10.5)
nRBC: 0 % (ref 0.0–0.2)

## 2023-07-16 LAB — RENAL FUNCTION PANEL
Albumin: 3.1 g/dL — ABNORMAL LOW (ref 3.5–5.0)
Anion gap: 7 (ref 5–15)
BUN: 23 mg/dL — ABNORMAL HIGH (ref 6–20)
CO2: 26 mmol/L (ref 22–32)
Calcium: 9.1 mg/dL (ref 8.9–10.3)
Chloride: 105 mmol/L (ref 98–111)
Creatinine, Ser: 0.89 mg/dL (ref 0.61–1.24)
GFR, Estimated: >60 mL/min (ref >60)
Glucose, Bld: 105 mg/dL — ABNORMAL HIGH (ref 70–99)
Phosphorus: 3.7 mg/dL (ref 2.5–4.6)
Potassium: 4.4 mmol/L (ref 3.5–5.1)
Sodium: 138 mmol/L (ref 135–145)

## 2023-07-16 LAB — MAGNESIUM: Magnesium: 2.1 mg/dL (ref 1.7–2.4)

## 2023-07-16 MED ORDER — METOCLOPRAMIDE HCL 5 MG/ML IJ SOLN: 10.0000 mg | Freq: Once | INTRAMUSCULAR | Status: DC

## 2023-07-16 MED ORDER — LACTULOSE 10 GM/15ML PO SOLN
30.0000 g | Freq: Once | ORAL | Status: AC
  Administered 2023-07-16: 30 g via ORAL
  Filled 2023-07-16: qty 60

## 2023-07-16 MED ORDER — GABAPENTIN 100 MG PO CAPS
200.0000 mg | ORAL_CAPSULE | Freq: Two times a day (BID) | ORAL | Status: DC
  Administered 2023-07-16 (×2): 200 mg via ORAL
  Filled 2023-07-16 (×2): qty 2

## 2023-07-16 MED ORDER — QUETIAPINE FUMARATE 25 MG PO TABS
25.0000 mg | ORAL_TABLET | Freq: Every day | ORAL | Status: DC
  Administered 2023-07-16: 25 mg via ORAL
  Filled 2023-07-16: qty 1

## 2023-07-16 MED ORDER — CELECOXIB 200 MG PO CAPS
200.0000 mg | ORAL_CAPSULE | Freq: Two times a day (BID) | ORAL | Status: DC
  Administered 2023-07-16 (×2): 200 mg via ORAL
  Filled 2023-07-16 (×3): qty 1

## 2023-07-16 MED ORDER — PROCHLORPERAZINE EDISYLATE 10 MG/2ML IJ SOLN
10.0000 mg | Freq: Once | INTRAMUSCULAR | Status: AC
  Administered 2023-07-16: 10 mg via INTRAVENOUS
  Filled 2023-07-16: qty 2

## 2023-07-16 NOTE — TOC Initial Note (Signed)
 Transition of Care [facility]) - Initial/Assessment Note    Patient Details  Name: [name] MRN: [id number] Date of Birth: [birth year]  [facility] CM/SW Contact:    Chapman Fitch, RN Phone Number: 07/16/2023, 4:18 PM  Clinical Narrative:                    Admitted for: Hernia  Admitted from: Home with daughter WUJ:[id number] Current home health/prior home health/DME:NA  Therapy recommending home health.  Due to patient's insurance unable to secure home health agency.  Patient and MD notified.  Bariatric RW and BSC referral made to Jon with [employer]       Patient Goals and CMS Choice            Expected Discharge Plan and Services                                              Prior Living Arrangements/Services                       Activities of Daily Living   ADL Screening (condition at time of admission) Independently performs ADLs?: Yes (appropriate for developmental age) Is the patient deaf or have difficulty hearing?: No Does the patient have difficulty seeing, even when wearing glasses/contacts?: No Does the patient have difficulty concentrating, remembering, or making decisions?: No  Permission Sought/Granted                  Emotional Assessment              Admission diagnosis:  Bilateral inguinal hernia without obstruction or gangrene, recurrence not specified [K40.20] Ventral hernia without obstruction or gangrene [K43.9] Inguinal hernia [K40.90] Patient Active Problem List   Diagnosis Date Noted   Anaphylactic reaction 07/15/2023   TRALI (transfusion related acute lung injury) 07/15/2023   Ventral hernia without obstruction or gangrene 07/13/2023   Inguinal hernia 07/13/2023   Preventative health care 04/13/2016   Tobacco abuse 02/05/2016   Difficulty concentrating 02/05/2016   PCP:  Ailene Ravel, MD Pharmacy:   [facility] [phone] [facility] [facility], [place] - [address] AT [facility] [address] [place] [address] Phone: [phone] Fax: [phone]     Social Drivers of Health (SDOH) Social History: SDOH Screenings   Food Insecurity: No Food Insecurity (07/14/2023)  Housing: Low Risk  (07/14/2023)  Transportation Needs: No Transportation Needs (07/14/2023)  Utilities: Not At Risk (07/14/2023)  Tobacco Use: High Risk (07/13/2023)   SDOH Interventions:     Readmission Risk Interventions     No data to display

## 2023-07-16 NOTE — Progress Notes (Addendum)
 Patient is not able to walk the distance required to go the bathroom, or he/she is unable to safely negotiate stairs required to access the bathroom.  A 3in1 BSC will alleviate this problem

## 2023-07-16 NOTE — Progress Notes (Signed)
 Progress Note   Patient: [name] ZOX:[id number] DOB: [birth year] DOA: 07/13/2023     3 DOS: the patient was seen and examined on 07/16/2023   Brief hospital course: [name] is a 58 y.o. male with past medical history of morbid obesity, mild intermittent asthma, right inguinal hernia, GERD, OSA, migraines, who was admitted for elective right inguinal hernia repair, but during early stage of the procedure, it was found patient uncontrolled bleeding from the surgical site and surgery was aborted.  Patient was given FFP and platelet transfusion, however soon patient started develop tingling and burning sensation of bilateral feet ascending to bilateral calfs and thighs abdomen and chest wall and bilateral arms with hives and feeling of " hot all over" and soon patient passed out.  He was seen by ICU, appears to have anaphylactic reaction to plasma transfusion, also had significant hypoxemia. Patient received Solu-Medrol, IV Lasix.  Has been stabilized. General surgery request hospitalist take over patient care.   Principal Problem:   Ventral hernia without obstruction or gangrene Active Problems:   Inguinal hernia   Anaphylactic reaction   TRALI (transfusion related acute lung injury)   Assessment and Plan:  Seizure-like activity versus syncope Anaphylactic reaction Anaphylactic shock Condition has resolved.  Discontinue steroids.     Acute hypoxic respiratory failure -TRALI was suspected and patient received IV Lasix x 2 and now back to nasal cannula. Patient no longer has any short of breath.  He has been treated with IV steroids, continued on 2 days of oral steroids.  Condition has improved.  No additional need for steroids.   Benign prostate hypertrophy. Pyuria. Patient urine culture has no growth, patient will be treated with 2 days of Zosyn, will continue 3 days with Augmentin.   Right inguinal hernia Aborted surgery. Surgery follow-up on patient for secondary  surgery in the future. Still has a midline bleeding from surgical site.  Dressing change done by general surgery today.  Will follow CBC.   Generalized weakness. Patient has been very weak, obtain PT/OT.   Class III morbid obesity with BMI of 49.53. Diet and exercise.   Severe insomnia. Obstructive sleep apnea. Patient is sleeping very poorly at night, wakes up every 10 minutes.  He has a CPAP at home, he could not tolerate it.  Will try CPAP again in the hospital.  Also added Seroquel in addition to melatonin started yesterday.    Subjective:  Patient still feels very weak, tired.  Could not sleep at night.  Physical Exam: Vitals:   07/15/23 1934 07/16/23 0455 07/16/23 0500 07/16/23 0816  BP: (!) 149/90 (!) 156/87  (!) 151/70  Pulse: 70 76  83  Resp: 16 16  20   Temp: 98.4 F (36.9 C) 97.6 F (36.4 C)  98.5 F (36.9 C)  TempSrc:    Oral  SpO2: 95% 96%  97%  Weight:   (!) 172.4 kg   Height:       General exam: Appears calm and comfortable, morbid obese. Respiratory system: Clear to auscultation. Respiratory effort normal. Cardiovascular system: S1 & S2 heard, RRR. No JVD, murmurs, rubs, gallops or clicks. No pedal edema. Gastrointestinal system: Abdomen is nondistended, soft and nontender. No organomegaly or masses felt. Normal bowel sounds heard. Central nervous system: Alert and oriented. No focal neurological deficits. Extremities: Symmetric 5 x 5 power. Skin: No rashes, lesions or ulcers Psychiatry: Judgement and insight appear normal. Mood & affect appropriate.    Data Reviewed:  Lab results reviewed.  Family Communication: Daughter updated at bedside.  Disposition: Status is: Inpatient Remains inpatient appropriate because: Severity of disease,     Time spent: 35 minutes  Author: Marrion Coy, MD 07/16/2023 1:31 PM  For on call review [email or web address].

## 2023-07-16 NOTE — Progress Notes (Signed)
 Subjective:  CC: [name] is a 58 y.o. male  Hospital stay day 3, 3 Days Post-Op exploratory laparotomy for aborted inguinal hernia repair surgery due to bleeding issues  HPI: No acute issues overnight.  Pain control issues at the incision site as expected.  ROS:  General: Denies weight loss, weight gain, fatigue, fevers, chills, and night sweats. Heart: Denies chest pain, palpitations, racing heart, irregular heartbeat, leg pain or swelling, and decreased activity tolerance. Respiratory: Denies breathing difficulty, shortness of breath, wheezing, cough, and sputum. GI: Denies change in appetite, heartburn, nausea, vomiting, constipation, diarrhea, and blood in stool. GU: Denies difficulty urinating, pain with urinating, urgency, frequency, blood in urine.   Objective:   Temp:  [97.6 F (36.4 C)-98.4 F (36.9 C)] 97.6 F (36.4 C) (03/21 0455) Pulse Rate:  [70-85] 76 (03/21 0455) Resp:  [16-18] 16 (03/21 0455) BP: (144-170)/(81-90) 156/87 (03/21 0455) SpO2:  [95 %-97 %] 96 % (03/21 0455) Weight:  [172.4 kg] 172.4 kg (03/21 0500)     Height: 6\' 2"  (188 cm) Weight: (!) 172.4 kg BMI (Calculated): 48.78   Intake/Output this shift:   Intake/Output Summary (Last 24 hours) at 07/16/2023 0757 Last data filed at 07/16/2023 0630 Gross per 24 hour  Intake --  Output 2160 ml  Net -2160 ml    Constitutional :  alert, cooperative, appears stated age, and no distress  Respiratory:  clear to auscultation bilaterally  Cardiovascular:  regular rate and rhythm  Gastrointestinal: Soft, no guarding, TTP around staple line, with swelling consistent with likely hematoma, slightly decreased in size this morning after noted some bloody drainage on the overlying dressing. . JP continues with lightening serosanguinous drainage.  Skin: Cool and moist.   Psychiatric: Normal affect, non-agitated, not confused       LABS:     Latest Ref Rng & Units 07/16/2023    5:26 AM 07/15/2023    6:10 AM  07/14/2023    3:58 AM  CMP  Glucose 70 - 99 mg/dL 578  469  629   BUN 6 - 20 mg/dL 23  30  23    Creatinine 0.61 - 1.24 mg/dL 5.28  4.13  2.44   Sodium 135 - 145 mmol/L 138  135  135   Potassium 3.5 - 5.1 mmol/L 4.4  4.7  4.5   Chloride 98 - 111 mmol/L 105  106  108   CO2 22 - 32 mmol/L 26  24  21    Calcium 8.9 - 10.3 mg/dL 9.1  8.6  8.5       Latest Ref Rng & Units 07/16/2023    5:26 AM 07/15/2023    6:10 AM 07/14/2023    3:58 AM  CBC  WBC 4.0 - 10.5 K/uL 12.8  15.2  10.5   Hemoglobin 13.0 - 17.0 g/dL 01.0  27.2  53.6   Hematocrit 39.0 - 52.0 % 38.7  37.8  38.1   Platelets 150 - 400 K/uL 119  116  116     RADS: N/a Assessment:   S/p exploratory laparotomy for aborted inguinal hernia repair surgery due to bleeding issues.  No issues from my standpoint.  Hemoglobin remaining stable.  I added Celebrex and Neurontin to see if we can get better pain control.   okay to be discharged from my standpoint with JP in place and follow-up as outpatient, as long as no issues from a respiratory or renal standpoint from hospitalist.  We will discuss timing of second attempt at hernia repair in  the office.  labs/images/medications/previous chart entries reviewed personally and relevant changes/updates noted above.

## 2023-07-16 NOTE — Progress Notes (Signed)
 PT Cancellation Note  Patient Details Name: [name] MRN: [id number] DOB: 1966/03/18   Cancelled Treatment:    Reason Eval/Treat Not Completed: Other (comment). Pt politely declining PT due to elevated pain levels and stated he has been up to the bathroom several times today. All questions answered as able. PT to re-attempt as time allows.   Olga Coaster PT, DPT 1:24 PM,07/16/23

## 2023-07-16 NOTE — Discharge Instructions (Signed)
 Removal, Care After This sheet gives you information about how to care for yourself after your procedure. Your health care provider may also give you more specific instructions. If you have problems or questions, contact your health care provider. What can I expect after the procedure? After the procedure, it is common to have: Soreness. Bruising. Itching. Follow these instructions at home: site care Follow instructions from your health care provider about how to take care of your site. Make sure you: Wash your hands with soap and water before and after you change your bandage (dressing). If soap and water are not available, use hand sanitizer. Leave stitches (sutures), skin glue, or adhesive strips in place. These skin closures may need to stay in place for 2 weeks or longer. If adhesive strip edges start to loosen and curl up, you may trim the loose edges. Do not remove adhesive strips completely unless your health care provider tells you to do that. If the area bleeds or bruises, apply gentle pressure for 10 minutes. OK TO SHOWER   Check your site every day for signs of infection. Check for: Redness, swelling, or pain. Fluid or blood. Warmth. Pus or a bad smell.  General instructions Rest and then return to your normal activities as told by your health care provider.  tylenol and advil as needed for discomfort.  Please alternate between the two every four hours as needed for pain.    Use narcotics, if prescribed, only when tylenol and motrin is not enough to control pain.  325-650mg  every 8hrs to max of 3000mg /24hrs (including the 325mg  in every norco dose) for the tylenol.    Advil up to 800mg  per dose every 8hrs as needed for pain.   Keep all follow-up visits as told by your health care provider. This is important. Contact a health care provider if: You have redness, swelling, or pain around your site. You have fluid or blood coming from your site. Your site feels warm to the  touch. You have pus or a bad smell coming from your site. You have a fever. Your sutures, skin glue, or adhesive strips loosen or come off sooner than expected. Get help right away if: You have bleeding that does not stop with pressure or a dressing. Summary After the procedure, it is common to have some soreness, bruising, and itching at the site. Follow instructions from your health care provider about how to take care of your site. Check your site every day for signs of infection. Contact a health care provider if you have redness, swelling, or pain around your site, or your site feels warm to the touch. Keep all follow-up visits as told by your health care provider. This is important. This information is not intended to replace advice given to you by your health care provider. Make sure you discuss any questions you have with your health care provider. Document Released: 05/10/2015 Document Revised: 10/11/2017 Document Reviewed: 10/11/2017 Elsevier Interactive Patient Education  [employer].

## 2023-07-17 ENCOUNTER — Inpatient Hospital Stay: Payer: MEDICAID

## 2023-07-17 ENCOUNTER — Inpatient Hospital Stay: Payer: MEDICAID | Admitting: Anesthesiology

## 2023-07-17 ENCOUNTER — Other Ambulatory Visit: Payer: Self-pay

## 2023-07-17 ENCOUNTER — Encounter
Admission: RE | Disposition: A | Discharge status: Home / Self Care | Payer: Self-pay | Source: Home / Self Care | Attending: Internal Medicine

## 2023-07-17 DIAGNOSIS — T81321A Disruption or dehiscence of closure of internal operation (surgical) wound of abdominal wall muscle or fascia, initial encounter: Secondary | ICD-10-CM

## 2023-07-17 HISTORY — PX: LAPAROTOMY: SHX154

## 2023-07-17 LAB — CBC WITH DIFFERENTIAL/PLATELET
Abs Immature Granulocytes: 0.03 10*3/uL (ref 0.00–0.07)
Basophils Absolute: 0 10*3/uL (ref 0.0–0.1)
Basophils Relative: 0 %
Eosinophils Absolute: 0.1 10*3/uL (ref 0.0–0.5)
Eosinophils Relative: 1 %
HCT: 41.4 % (ref 39.0–52.0)
Hemoglobin: 14.2 g/dL (ref 13.0–17.0)
Immature Granulocytes: 0 %
Lymphocytes Relative: 15 %
Lymphs Abs: 1 10*3/uL (ref 0.7–4.0)
MCH: 35 pg — ABNORMAL HIGH (ref 26.0–34.0)
MCHC: 34.3 g/dL (ref 30.0–36.0)
MCV: 102 fL — ABNORMAL HIGH (ref 80.0–100.0)
Monocytes Absolute: 0.7 10*3/uL (ref 0.1–1.0)
Monocytes Relative: 11 %
Neutro Abs: 5 10*3/uL (ref 1.7–7.7)
Neutrophils Relative %: 73 %
Platelets: 107 10*3/uL — ABNORMAL LOW (ref 150–400)
RBC: 4.06 MIL/uL — ABNORMAL LOW (ref 4.22–5.81)
RDW: 14 % (ref 11.5–15.5)
WBC: 6.7 10*3/uL (ref 4.0–10.5)
nRBC: 0 % (ref 0.0–0.2)

## 2023-07-17 LAB — BLOOD GAS, ARTERIAL
Acid-base deficit: 4.5 mmol/L — ABNORMAL HIGH (ref 0.0–2.0)
Bicarbonate: 25.7 mmol/L (ref 20.0–28.0)
FIO2: 60 %
O2 Saturation: 92.3 %
PEEP: 5 cmH2O
Patient temperature: 37
pCO2 arterial: 72 mmHg (ref 32–48)
pH, Arterial: 7.16 — CL (ref 7.35–7.45)
pO2, Arterial: 67 mmHg — ABNORMAL LOW (ref 83–108)

## 2023-07-17 LAB — COMPREHENSIVE METABOLIC PANEL
ALT: 19 U/L (ref 0–44)
AST: 30 U/L (ref 15–41)
Albumin: 3.2 g/dL — ABNORMAL LOW (ref 3.5–5.0)
Alkaline Phosphatase: 64 U/L (ref 38–126)
Anion gap: 9 (ref 5–15)
BUN: 24 mg/dL — ABNORMAL HIGH (ref 6–20)
CO2: 24 mmol/L (ref 22–32)
Calcium: 9.4 mg/dL (ref 8.9–10.3)
Chloride: 101 mmol/L (ref 98–111)
Creatinine, Ser: 0.94 mg/dL (ref 0.61–1.24)
GFR, Estimated: >60 mL/min (ref >60)
Glucose, Bld: 117 mg/dL — ABNORMAL HIGH (ref 70–99)
Potassium: 3.9 mmol/L (ref 3.5–5.1)
Sodium: 134 mmol/L — ABNORMAL LOW (ref 135–145)
Total Bilirubin: 2.1 mg/dL — ABNORMAL HIGH (ref 0.0–1.2)
Total Protein: 6.6 g/dL (ref 6.5–8.1)

## 2023-07-17 LAB — PROTIME-INR
INR: 1.3 — ABNORMAL HIGH (ref 0.8–1.2)
Prothrombin Time: 16.5 s — ABNORMAL HIGH (ref 11.4–15.2)

## 2023-07-17 LAB — GLUCOSE, CAPILLARY: Glucose-Capillary: 124 mg/dL — ABNORMAL HIGH (ref 70–99)

## 2023-07-17 LAB — MAGNESIUM: Magnesium: 2.1 mg/dL (ref 1.7–2.4)

## 2023-07-17 SURGERY — LAPAROTOMY, EXPLORATORY
Anesthesia: General

## 2023-07-17 MED ORDER — HYDROMORPHONE HCL 1 MG/ML IJ SOLN
1.0000 mg | INTRAMUSCULAR | Status: DC | PRN
  Administered 2023-07-18 – 2023-07-28 (×6): 1 mg via INTRAVENOUS
  Filled 2023-07-17 (×7): qty 1

## 2023-07-17 MED ORDER — ROCURONIUM BROMIDE 10 MG/ML (PF) SYRINGE
PREFILLED_SYRINGE | INTRAVENOUS | Status: AC
Start: 2023-07-17 — End: 2023-07-17
  Administered 2023-07-17: 100 mg via INTRAVENOUS
  Filled 2023-07-17: qty 10

## 2023-07-17 MED ORDER — KETOROLAC TROMETHAMINE 30 MG/ML IJ SOLN: 30.0000 mg | Freq: Four times a day (QID) | INTRAMUSCULAR | Status: DC | PRN

## 2023-07-17 MED ORDER — DEXAMETHASONE SODIUM PHOSPHATE 10 MG/ML IJ SOLN
INTRAMUSCULAR | Status: DC | PRN
  Administered 2023-07-17: 10 mg via INTRAVENOUS

## 2023-07-17 MED ORDER — MIDAZOLAM HCL 2 MG/2ML IJ SOLN
INTRAMUSCULAR | Status: DC | PRN
  Administered 2023-07-17: 2 mg via INTRAVENOUS

## 2023-07-17 MED ORDER — ONDANSETRON HCL 4 MG/2ML IJ SOLN
INTRAMUSCULAR | Status: DC | PRN
  Administered 2023-07-17: 4 mg via INTRAVENOUS

## 2023-07-17 MED ORDER — SODIUM CHLORIDE (PF) 0.9 % IJ SOLN
INTRAMUSCULAR | Status: AC
  Filled 2023-07-17: qty 20

## 2023-07-17 MED ORDER — BUPIVACAINE LIPOSOME 1.3 % IJ SUSP
INTRAMUSCULAR | Status: AC
  Filled 2023-07-17: qty 20

## 2023-07-17 MED ORDER — MIDAZOLAM HCL 2 MG/2ML IJ SOLN
INTRAMUSCULAR | Status: AC
  Administered 2023-07-17: 2 mg via INTRAVENOUS
  Filled 2023-07-17: qty 2

## 2023-07-17 MED ORDER — SODIUM CHLORIDE (PF) 0.9 % IJ SOLN
INTRAMUSCULAR | Status: DC | PRN
  Administered 2023-07-17: 70 mL

## 2023-07-17 MED ORDER — SODIUM CHLORIDE 0.9 % IV SOLN: INTRAVENOUS | Status: AC

## 2023-07-17 MED ORDER — ROCURONIUM BROMIDE 100 MG/10ML IV SOLN
INTRAVENOUS | Status: DC | PRN
  Administered 2023-07-17: 30 mg via INTRAVENOUS
  Administered 2023-07-17: 100 mg via INTRAVENOUS
  Administered 2023-07-17: 40 mg via INTRAVENOUS
  Administered 2023-07-17: 30 mg via INTRAVENOUS

## 2023-07-17 MED ORDER — FENTANYL 2500MCG IN NS 250ML (10MCG/ML) PREMIX INFUSION
0.0000 ug/h | INTRAVENOUS | Status: DC
  Administered 2023-07-17: 25 ug/h via INTRAVENOUS
  Administered 2023-07-18: 200 ug/h via INTRAVENOUS
  Filled 2023-07-17 (×2): qty 250

## 2023-07-17 MED ORDER — NOREPINEPHRINE 4 MG/250ML-% IV SOLN
0.0000 ug/min | INTRAVENOUS | Status: DC
  Administered 2023-07-17: 10 ug/min via INTRAVENOUS
  Administered 2023-07-17: 2 ug/min via INTRAVENOUS
  Administered 2023-07-18: 25 ug/min via INTRAVENOUS
  Administered 2023-07-18 (×2): 27 ug/min via INTRAVENOUS
  Administered 2023-07-18: 13 ug/min via INTRAVENOUS
  Administered 2023-07-18: 27 ug/min via INTRAVENOUS
  Administered 2023-07-18: 30.133 ug/min via INTRAVENOUS
  Filled 2023-07-17 (×7): qty 250

## 2023-07-17 MED ORDER — ETOMIDATE 2 MG/ML IV SOLN
INTRAVENOUS | Status: AC
  Administered 2023-07-17: 20 mg via INTRAVENOUS
  Filled 2023-07-17: qty 10

## 2023-07-17 MED ORDER — FENTANYL CITRATE (PF) 100 MCG/2ML IJ SOLN
INTRAMUSCULAR | Status: AC
  Filled 2023-07-17: qty 2

## 2023-07-17 MED ORDER — FENTANYL 2500MCG IN NS 250ML (10MCG/ML) PREMIX INFUSION
INTRAVENOUS | Status: AC
  Administered 2023-07-17: 50 ug/h via INTRAVENOUS
  Filled 2023-07-17: qty 250

## 2023-07-17 MED ORDER — ROCURONIUM BROMIDE 10 MG/ML (PF) SYRINGE
PREFILLED_SYRINGE | INTRAVENOUS | Status: AC
  Administered 2023-07-17: 100 mg via INTRAVENOUS
  Filled 2023-07-17: qty 10

## 2023-07-17 MED ORDER — PIPERACILLIN-TAZOBACTAM 3.375 G IVPB
3.3750 g | Freq: Three times a day (TID) | INTRAVENOUS | Status: DC
  Administered 2023-07-17 – 2023-07-20 (×9): 3.375 g via INTRAVENOUS
  Filled 2023-07-17 (×9): qty 50

## 2023-07-17 MED ORDER — ETOMIDATE 2 MG/ML IV SOLN: 20.0000 mg | Freq: Once | INTRAVENOUS | Status: AC

## 2023-07-17 MED ORDER — ROCURONIUM BROMIDE 10 MG/ML (PF) SYRINGE: 100.0000 mg | PREFILLED_SYRINGE | Freq: Once | INTRAVENOUS | Status: AC

## 2023-07-17 MED ORDER — PROPOFOL 1000 MG/100ML IV EMUL
INTRAVENOUS | Status: AC
  Filled 2023-07-17: qty 100

## 2023-07-17 MED ORDER — ROCURONIUM BROMIDE 10 MG/ML (PF) SYRINGE
PREFILLED_SYRINGE | INTRAVENOUS | Status: AC
  Filled 2023-07-17: qty 10

## 2023-07-17 MED ORDER — HYDROMORPHONE HCL 1 MG/ML IJ SOLN
INTRAMUSCULAR | Status: AC
  Administered 2023-07-17: 1 mg
  Filled 2023-07-17: qty 1

## 2023-07-17 MED ORDER — BUPIVACAINE-EPINEPHRINE (PF) 0.5% -1:200000 IJ SOLN
INTRAMUSCULAR | Status: AC
  Filled 2023-07-17: qty 30

## 2023-07-17 MED ORDER — DEXAMETHASONE SODIUM PHOSPHATE 10 MG/ML IJ SOLN
INTRAMUSCULAR | Status: AC
  Filled 2023-07-17: qty 1

## 2023-07-17 MED ORDER — PIPERACILLIN-TAZOBACTAM 3.375 G IVPB
3.3750 g | INTRAVENOUS | Status: AC
Start: 2023-07-17 — End: 2023-07-17
  Administered 2023-07-17: 3.375 g via INTRAVENOUS

## 2023-07-17 MED ORDER — FENTANYL CITRATE (PF) 100 MCG/2ML IJ SOLN
INTRAMUSCULAR | Status: AC
  Administered 2023-07-17: 100 ug
  Filled 2023-07-17: qty 2

## 2023-07-17 MED ORDER — PANTOPRAZOLE SODIUM 40 MG IV SOLR
40.0000 mg | INTRAVENOUS | Status: DC
  Administered 2023-07-17 – 2023-07-28 (×12): 40 mg via INTRAVENOUS
  Filled 2023-07-17 (×12): qty 10

## 2023-07-17 MED ORDER — PROPOFOL 1000 MG/100ML IV EMUL
5.0000 ug/kg/min | INTRAVENOUS | Status: DC
  Administered 2023-07-17 (×2): 40 ug/kg/min via INTRAVENOUS
  Administered 2023-07-17: 30 ug/kg/min via INTRAVENOUS
  Administered 2023-07-17: 40 ug/kg/min via INTRAVENOUS
  Administered 2023-07-17 (×2): 30 ug/kg/min via INTRAVENOUS
  Administered 2023-07-18 (×4): 40 ug/kg/min via INTRAVENOUS
  Filled 2023-07-17 (×10): qty 100

## 2023-07-17 MED ORDER — ACETAMINOPHEN 10 MG/ML IV SOLN
1000.0000 mg | Freq: Four times a day (QID) | INTRAVENOUS | Status: AC
Start: 2023-07-17 — End: 2023-07-18
  Administered 2023-07-17 – 2023-07-18 (×4): 1000 mg via INTRAVENOUS
  Filled 2023-07-17 (×4): qty 100

## 2023-07-17 MED ORDER — KETAMINE HCL 50 MG/5ML IJ SOSY
PREFILLED_SYRINGE | INTRAMUSCULAR | Status: DC | PRN
  Administered 2023-07-17: 20 mg via INTRAVENOUS
  Administered 2023-07-17: 30 mg via INTRAVENOUS

## 2023-07-17 MED ORDER — PROPOFOL 10 MG/ML IV BOLUS
INTRAVENOUS | Status: DC | PRN
  Administered 2023-07-17: 30 mg via INTRAVENOUS

## 2023-07-17 MED ORDER — FENTANYL CITRATE (PF) 100 MCG/2ML IJ SOLN
INTRAMUSCULAR | Status: DC | PRN
Start: 2023-07-17 — End: 2023-07-17
  Administered 2023-07-17: 50 ug via INTRAVENOUS
  Administered 2023-07-17: 100 ug via INTRAVENOUS
  Administered 2023-07-17: 50 ug via INTRAVENOUS

## 2023-07-17 MED ORDER — NOREPINEPHRINE 4 MG/250ML-% IV SOLN
INTRAVENOUS | Status: AC
  Filled 2023-07-17: qty 250

## 2023-07-17 MED ORDER — FENTANYL CITRATE PF 50 MCG/ML IJ SOSY: 100.0000 ug | PREFILLED_SYRINGE | Freq: Once | INTRAMUSCULAR | Status: DC

## 2023-07-17 MED ORDER — PHENYLEPHRINE 80 MCG/ML (10ML) SYRINGE FOR IV PUSH (FOR BLOOD PRESSURE SUPPORT)
PREFILLED_SYRINGE | INTRAVENOUS | Status: DC | PRN
  Administered 2023-07-17: 80 ug via INTRAVENOUS
  Administered 2023-07-17: 160 ug via INTRAVENOUS
  Administered 2023-07-17: 80 ug via INTRAVENOUS
  Administered 2023-07-17 (×3): 160 ug via INTRAVENOUS
  Administered 2023-07-17 (×3): 80 ug via INTRAVENOUS
  Administered 2023-07-17: 160 ug via INTRAVENOUS

## 2023-07-17 MED ORDER — ONDANSETRON HCL 4 MG/2ML IJ SOLN
INTRAMUSCULAR | Status: AC
  Filled 2023-07-17: qty 2

## 2023-07-17 MED ORDER — SODIUM CHLORIDE 0.9% IV SOLUTION: Freq: Once | INTRAVENOUS | Status: DC

## 2023-07-17 MED ORDER — PANTOPRAZOLE SODIUM 40 MG IV SOLR
40.0000 mg | Freq: Once | INTRAVENOUS | Status: AC
  Administered 2023-07-17: 40 mg via INTRAVENOUS
  Filled 2023-07-17: qty 10

## 2023-07-17 MED ORDER — MIDAZOLAM HCL 2 MG/2ML IJ SOLN: 2.0000 mg | Freq: Once | INTRAMUSCULAR | Status: AC

## 2023-07-17 MED ORDER — PIPERACILLIN-TAZOBACTAM 3.375 G IVPB
INTRAVENOUS | Status: AC
  Filled 2023-07-17: qty 50

## 2023-07-17 MED ORDER — INSULIN ASPART 100 UNIT/ML IJ SOLN
0.0000 [IU] | Freq: Four times a day (QID) | INTRAMUSCULAR | Status: DC
  Administered 2023-07-18 – 2023-07-19 (×4): 1 [IU] via SUBCUTANEOUS
  Administered 2023-07-19 (×2): 2 [IU] via SUBCUTANEOUS
  Administered 2023-07-19 – 2023-07-27 (×8): 1 [IU] via SUBCUTANEOUS
  Filled 2023-07-17 (×11): qty 1

## 2023-07-17 MED ORDER — KETAMINE HCL 50 MG/5ML IJ SOSY
PREFILLED_SYRINGE | INTRAMUSCULAR | Status: AC
  Filled 2023-07-17: qty 5

## 2023-07-17 MED ORDER — MIDAZOLAM HCL 2 MG/2ML IJ SOLN
INTRAMUSCULAR | Status: AC
  Filled 2023-07-17: qty 2

## 2023-07-17 MED ORDER — PROPOFOL 10 MG/ML IV BOLUS
INTRAVENOUS | Status: AC
  Filled 2023-07-17: qty 40

## 2023-07-17 MED ORDER — ROCURONIUM BROMIDE 10 MG/ML (PF) SYRINGE
100.0000 mg | PREFILLED_SYRINGE | Freq: Once | INTRAVENOUS | Status: AC
Start: 2023-07-17 — End: 2023-07-17

## 2023-07-17 MED ORDER — 0.9 % SODIUM CHLORIDE (POUR BTL) OPTIME
TOPICAL | Status: DC | PRN
  Administered 2023-07-17: 1000 mL

## 2023-07-17 MED ORDER — FENTANYL CITRATE (PF) 100 MCG/2ML IJ SOLN
INTRAMUSCULAR | Status: AC
Start: 2023-07-17 — End: ?
  Filled 2023-07-17: qty 2

## 2023-07-17 MED ORDER — TRACE MINERALS CU-MN-SE-ZN 300-55-60-3000 MCG/ML IV SOLN
INTRAVENOUS | Status: AC
  Filled 2023-07-17: qty 1000

## 2023-07-17 MED ORDER — LACTATED RINGERS IV SOLN: INTRAVENOUS | Status: DC | PRN

## 2023-07-17 SURGICAL SUPPLY — 51 items
CATH ROBINSON RED A/P 14FR (CATHETERS) IMPLANT
CHLORAPREP W/TINT 26 (MISCELLANEOUS) IMPLANT
CLEANSER WND VASHE INSTL 34OZ (WOUND CARE) IMPLANT
DRAIN CHANNEL 19F RND (DRAIN) IMPLANT
DRAPE LAPAROTOMY 100X77 ABD (DRAPES) ×1 IMPLANT
DRAPE SHEET LG 3/4 BI-LAMINATE (DRAPES) IMPLANT
DRSG OPSITE POSTOP 4X12 (GAUZE/BANDAGES/DRESSINGS) IMPLANT
DRSG TEGADERM 4X10 (GAUZE/BANDAGES/DRESSINGS) IMPLANT
DRSG TEGADERM 4X4.75 (GAUZE/BANDAGES/DRESSINGS) IMPLANT
ELECT CAUTERY BLADE TIP 2.5 (TIP) ×1 IMPLANT
ELECT EZSTD 165MM 6.5IN (MISCELLANEOUS) ×1 IMPLANT
ELECT REM PT RETURN 9FT ADLT (ELECTROSURGICAL) ×1 IMPLANT
ELECTRODE CAUTERY BLDE TIP 2.5 (TIP) ×1 IMPLANT
ELECTRODE EZSTD 165MM 6.5IN (MISCELLANEOUS) ×1 IMPLANT
ELECTRODE REM PT RTRN 9FT ADLT (ELECTROSURGICAL) ×1 IMPLANT
EVACUATOR SILICONE 100CC (DRAIN) IMPLANT
GAUZE 4X4 16PLY ~~LOC~~+RFID DBL (SPONGE) IMPLANT
GAUZE SPONGE 4X4 12PLY STRL (GAUZE/BANDAGES/DRESSINGS) IMPLANT
GLOVE SURG SYN 7.0 (GLOVE) ×2 IMPLANT
GLOVE SURG SYN 7.0 PF PI (GLOVE) ×2 IMPLANT
GLOVE SURG SYN 7.5 E (GLOVE) ×2 IMPLANT
GLOVE SURG SYN 7.5 PF PI (GLOVE) ×2 IMPLANT
GOWN STRL REUS W/ TWL LRG LVL3 (GOWN DISPOSABLE) ×4 IMPLANT
HOLDER FOLEY CATH W/STRAP (MISCELLANEOUS) IMPLANT
LABEL OR SOLS (LABEL) ×1 IMPLANT
LIGASURE IMPACT 36 18CM CVD LR (INSTRUMENTS) IMPLANT
MANIFOLD NEPTUNE II (INSTRUMENTS) ×1 IMPLANT
NDL HYPO 22X1.5 SAFETY MO (MISCELLANEOUS) ×1 IMPLANT
NEEDLE HYPO 22X1.5 SAFETY MO (MISCELLANEOUS) ×1 IMPLANT
NS IRRIG 1000ML POUR BTL (IV SOLUTION) ×1 IMPLANT
PACK BASIN MAJOR ARMC (MISCELLANEOUS) ×1 IMPLANT
PACK COLON CLEAN CLOSURE (MISCELLANEOUS) ×1 IMPLANT
PAD ABD DERMACEA PRESS 5X9 (GAUZE/BANDAGES/DRESSINGS) IMPLANT
SEPRAFILM MEMBRANE 5X6 (MISCELLANEOUS) IMPLANT
SPONGE T-LAP 18X18 ~~LOC~~+RFID (SPONGE) ×2 IMPLANT
STAPLER SKIN PROX 35W (STAPLE) ×1 IMPLANT
SUT ETHILON 3-0 FS-10 30 BLK (SUTURE) ×1 IMPLANT
SUT PDS AB 1 CT1 36 (SUTURE) ×1 IMPLANT
SUT PROLENE 2 0 SH DA (SUTURE) IMPLANT
SUT PROLENE 2 TP 1 (SUTURE) IMPLANT
SUT SILK 2-0 18XBRD TIE 12 (SUTURE) ×1 IMPLANT
SUT SILK 3 0 SH CR/8 (SUTURE) ×1 IMPLANT
SUT VIC AB 3-0 SH 27X BRD (SUTURE) ×1 IMPLANT
SUTURE EHLN 3-0 FS-10 30 BLK (SUTURE) IMPLANT
SYR 10ML LL (SYRINGE) ×1 IMPLANT
SYR 20ML LL LF (SYRINGE) IMPLANT
SYR 30ML LL (SYRINGE) IMPLANT
TOWEL OR 17X26 4PK STRL BLUE (TOWEL DISPOSABLE) IMPLANT
TRAP FLUID SMOKE EVACUATOR (MISCELLANEOUS) ×1 IMPLANT
TRAY FOLEY MTR SLVR 16FR STAT (SET/KITS/TRAYS/PACK) ×1 IMPLANT
WATER STERILE IRR 500ML POUR (IV SOLUTION) ×1 IMPLANT

## 2023-07-17 NOTE — Procedures (Addendum)
 CENTRAL VENOUS CATHETER INSERTION PROCEDURE NOTE  OTHER ATIENZA  [id number]  02/09/1966  Date:07/17/23  Time:4:19 AM   Provider Performing:Monique Hefty A Idalys Konecny   Procedure: Insertion of Non-tunneled Central Venous Catheter(36556) with US guidance (62952)   Indication(s) Medication administration and Difficult access  Consent Unable to obtain consent due to emergent nature of procedure.  Anesthesia Topical only with 1% lidocaine   Timeout Verified patient identification, verified procedure, site/side was marked, verified correct patient position, special equipment/implants available, medications/allergies/relevant history reviewed, required imaging and test results available.  Sterile Technique Maximal sterile technique including full sterile barrier drape, hand hygiene, sterile gown, sterile gloves, mask, hair covering, sterile ultrasound probe cover (if used).  Procedure Description Area of catheter insertion was cleaned with chlorhexidine and draped in sterile fashion.  With real-time ultrasound guidance a central venous catheter was placed into the left internal jugular vein. Nonpulsatile blood flow and easy flushing noted in all ports.  The catheter was sutured in place and sterile dressing applied.  Complications/Tolerance None; patient tolerated the procedure well. Chest X-ray is ordered to verify placement for internal jugular or subclavian cannulation.   Chest x-ray is not ordered for femoral cannulation.  EBL Minimal  Specimen(s) None  Webb Silversmith, DNP, CCRN, FNP-C, AGACNP-BC Acute Care & Family Nurse Practitioner  [facility]  See Amion for personal pager PCCM on call pager [phone] until 7 am

## 2023-07-17 NOTE — Procedures (Addendum)
 INTUBATION PROCEDURE NOTE  HASAN DOUSE  [id number]  May 14, 1965  Date:07/17/23  Time:4:16 AM   Provider Performing:Kayin Kettering A Lauralyn Shadowens   Procedure: Intubation (31500)  Indication(s) Respiratory Failure  Consent Unable to obtain consent due to emergent nature of procedure.  Anesthesia Etomidate, Fentanyl, and Rocuronium  Time Out Verified patient identification, verified procedure, site/side was marked, verified correct patient position, special equipment/implants available, medications/allergies/relevant history reviewed, required imaging and test results available.  Sterile Technique Usual hand hygeine, masks, and gloves were used  Procedure Description Patient positioned in bed supine.  Sedation given as noted above.  Patient was intubated with endotracheal tube using Glidescope.  View was Grade 1 full glottis .  Number of attempts was 1.  Colorimetric CO2 detector was consistent with tracheal placement.  Complications/Tolerance None; patient tolerated the procedure well. Chest X-ray is ordered to verify placement.  EBL Minimal  Specimen(s) None   Webb Silversmith, DNP, CCRN, FNP-C, AGACNP-BC Acute Care & Family Nurse Practitioner  [facility]  See Amion for personal pager PCCM on call pager [phone] until 7 am

## 2023-07-17 NOTE — Progress Notes (Signed)
   07/17/23 1045  Spiritual Encounters  Type of Visit Initial  Care provided to: Family  Referral source Chaplain team  Reason for visit Urgent spiritual support  OnCall Visit No   Chaplain visited with family member, [name], who expressed relief, she had received good news about her father's prognosis. She also shared that her father would remain sedated today so she was going to go home and get some rest herself.  I wished her a day of revitalization and peace.    [name] [facility]  [phone]

## 2023-07-17 NOTE — Progress Notes (Signed)
       CROSS COVER NOTE  NAME: RUBIN DAIS MRN: [id number] DOB : Sep 16, 1965 ATTENDING PHYSICIAN: Marrion Coy, MD    Date of Service   07/17/2023   HPI/Events of Note   Nurse reports bad nausea with dry heaving and one bile looking vomitus episode. Patient endorses abdomen is more distended. Aldso reports increased pain with vomiting   Interventions   Assessment/Plan:    07/16/2023   11:25 PM 07/16/2023    8:06 PM 07/16/2023    3:11 PM  Vitals with BMI  Systolic 166 149 782  Diastolic 81 78 64  Pulse 92 79 76   Abdomen distended  Surgical dressing with large amount serous drainage Pain throughout abdomen with palpation mostly RUQ KUB IMPRESSION: 1. Dilatation of small bowel in the mid to lower abdomen more so on the left, maximum small bowel caliber 6.2 cm. Findings could be due to postoperative ileus or postoperative mid to distal small bowel obstruction. 2. Mild to moderate fecal stasis. Colonic aeration remains through to the rectum. 3. No supine evidence for free air.  NGT placement ordered  During NGT placement, patient experienced wound Dehiscience and bowel protrusion VSS Exposed bowel wrapped with saline gauze  Dr Aleen Campi informed Patient transferred to ICU        Donnie Mesa NP [facility] Cross Cover 7pm-7am - check amion for availability Pager [phone]

## 2023-07-17 NOTE — Progress Notes (Signed)
   07/17/23 0750  Spiritual Encounters  Type of Visit Follow up  Care provided to: [facility] partners present during encounter Nurse  Reason for visit Routine spiritual support  OnCall Visit No  Interventions  Spiritual Care Interventions Made Normalization of emotions;Encouragement  Intervention Outcomes  Outcomes Awareness around self/spiritual resourses  Spiritual Care Plan  Spiritual Care Issues Still Outstanding Referring to oncoming chaplain for further support   Patient brought back to room after surgery and chaplain made sure daughter, Alcario Drought, could come back. Let Alcario Drought know that a new chaplain is coming on but she will make them aware of the situation. Alcario Drought thanked chaplain with a hug and thanked her for staying with her until patient went to surgery.

## 2023-07-17 NOTE — Progress Notes (Signed)
 Initial Nutrition Assessment  DOCUMENTATION CODES:   Morbid obesity  INTERVENTION:   TPN per pharmacy  Recommend MVI, thiamine and folic acid daily in TPN  Pt at high refeed risk; recommend monitor potassium, magnesium and phosphorus labs daily until stable  Daily weights  NUTRITION DIAGNOSIS:   Inadequate oral intake related to altered GI function as evidenced by NPO status.  GOAL:   Provide needs based on ASPEN/SCCM guidelines  MONITOR:   Vent status, Labs, Weight trends, Skin, I & O's, Other (Comment) (TPN)  REASON FOR ASSESSMENT:   Consult New TPN/TNA  ASSESSMENT:   58 y/o male with h/o etoh abuse, HTN, OSA, asthma, depression, GERD, incarcerated ventral hernia s/p repair 2014 (no resections), hydrocele s/p right hydrocelectomy 2018 and bilateral inguinal hernias who is admitted for elective right inguinal hernia repair & intra-abdominal adhesions s/p diagnostic laparoscopy, converted to exploratory laparotomy & lysis of adhesions with aborted hernia repair 3/18 secondary to bleeding, hematoma and shock complicated by fascial dehiscence with small bowel evisceration s/p exploratory laparotomy, abdominal washout, abdominal closure with retention sutures 3/22.  RD working remotely.  Pt sedated and ventilated. NGT in place to LIS. Pt remains NPO. TPN initiated today. Pt has been without adequate nutrition for  > 5 days. Pt is at high refeed risk. Per chart, pt is up ~17lbs from his UBW. Pt -5.1L on his I & Os.   Medications reviewed and include: insulin, protonix, levophed, propofol   Labs reviewed: Na 134(L), K 3.9 wnl, BUN 24(H) P 3.7 wnl- 3/21  Patient is currently intubated on ventilator support MV: 13.4 L/min Temp (24hrs), Avg:98.2 F (36.8 C), Min:97.8 F (36.6 C), Max:98.8 F (37.1 C)  Propofol: 31 ml/hr- provides 818kcal/day   MAP >52mmHg   UOP-   Drains-   NUTRITION - FOCUSED PHYSICAL EXAM: Unable to perform at this time   Diet  Order:   Diet Order             Diet NPO time specified  Diet effective now                  EDUCATION NEEDS:   No education needs have been identified at this time  Skin:  Skin Assessment: Reviewed RN Assessment (incision abdomen)  Last BM:  3/18  Height:   Ht Readings from Last 1 Encounters:  07/13/23 6\' 2"  (1.88 m)    Weight:   Wt Readings from Last 1 Encounters:  07/17/23 (!) 176.2 kg    Ideal Body Weight:  86.3 kg  BMI:  Body mass index is 49.87 kg/m.  Estimated Nutritional Needs:   Kcal:  1900-2200kcal/day  Protein:  172-215g/day  Fluid:  2.7-3.0L/day  Betsey Holiday MS, RD, LDN If unable to be reached, please send secure chat to "RD inpatient" available from 8:00a-4:00p daily

## 2023-07-17 NOTE — Progress Notes (Signed)
 Pharmacy Antibiotic Note  [name] is a 58 y.o. male w/ PMH of morbid obesity, mild intermittent asthma, right inguinal hernia, GERD, OSA, migraines admitted on 07/13/2023 with a ventral hernia.  Pharmacy has been consulted for Zosyn dosing.  Plan: start Zosyn 3.375g IV q8h (4 hour infusion).  Height: 6\' 2"  (188 cm) Weight: (!) 176.2 kg (388 lb 7.2 oz) IBW/kg (Calculated) : 82.2  Temp (24hrs), Avg:98.3 F (36.8 C), Min:97.8 F (36.6 C), Max:98.8 F (37.1 C)  Recent Labs  Lab 07/13/23 1533 07/13/23 1651 07/14/23 0358 07/15/23 0610 07/16/23 0526 07/17/23 0242  WBC 5.7 15.5* 10.5 15.2* 12.8* 6.7  CREATININE 1.10  --  1.29* 1.13 0.89 0.94    Estimated Creatinine Clearance: 150.5 mL/min (by C-G formula based on SCr of 0.94 mg/dL).    Allergies  Allergen Reactions   Sulfa Antibiotics Anaphylaxis and Hives    Antimicrobials this admission: 03/20 Augmentin >> 03/22 03/18 Zosyn >> 03/20  03/22 >>  Microbiology results: 03/18 BCx: NGTD 03/18 UCx: NG  final  03/19 MRSA PCR: negative  Thank you for allowing pharmacy to be a part of this patient's care.  Lowella Bandy 07/17/2023 7:50 AM

## 2023-07-17 NOTE — Progress Notes (Signed)
 CRITICAL CARE     Name: [name] MRN: [id number] DOB: [birth year]     LOS: 4   SUBJECTIVE FINDINGS & SIGNIFICANT EVENTS    History of Presenting Illness:   58 yo M with hx of obesity , OSA, , EtOH abuse hx of admission to hospital for ETOH withdrawal.  He came in for elective surgery to have inguinal hernia repair with lysis of adhesions.  He had significant bleeding per surgeon which was unusual and required electrocautery and ligature for hemostasis.  He was brought back with abdominal JP drain and had FFP and platelets ordered due to intraoperative bleeding.  After receiving these blood products patient deteriorated and developed circulatory shock, hypoxemia, swelling worse on lower extremities.  He had code RRT called and was emergently brought to ICU due to shock with hypoxemia. He was treated for anaphylactic non febrile blood product reaction vs TRALI and improved overnight.  He became stable and was downgraded to medical floor.    Ive discussed case with Blood bank - [facility] -Interim charge and Jimmy Picket -Director of blood bank, we discussed patients reaction and there will be additional workup for this anaphylactic reaction vs Transfusion related acute lung injury.    07/17/23- patient had code RRT overnight, he had findings of small bowel evisceration secondary to fascial dehiscence.  This likely stemmed from forceful cough per surgery service.  He became unstable in distress and required endotracheal intubation.  He was taken to OR emergently for ex-lap and closure of abdominal cavity.  Daughter was available to come in and we reviewed medical plan.    Lines/tubes : Closed System Drain Left LUQ Bulb (JP) 19 Fr. (Active)  Dressing Status Clean, Dry, Intact 07/13/23 1458  Output (mL) 40 mL  07/13/23 1458     Urethral Catheter morgan Double-lumen 16 Fr. (Active)  Site Assessment Clean, Dry, Intact 07/13/23 1458  Output (mL) 175 mL 07/13/23 1458    Microbiology/Sepsis markers: Results for orders placed or performed during the hospital encounter of 07/13/23  Urine Culture     Status: None   Collection Time: 07/13/23  4:51 PM   Specimen: Urine, Random  Result Value Ref Range Status   Specimen Description   Final    URINE, RANDOM Performed at [facility], [address] [place], [place] [address]    Special Requests   Final    NONE Reflexed from [id number] Performed at [facility], [address] [place], [place] [address]    Culture   Final    NO GROWTH Performed at [facility], [address] [place], [place] [address]    Report Status 07/14/2023 FINAL  Final  Culture, blood (Routine X 2) w Reflex to ID Panel     Status: None (Preliminary result)   Collection Time: 07/13/23  5:59 PM   Specimen: Right Antecubital; Blood  Result Value Ref Range Status   Specimen Description RIGHT ANTECUBITAL  Final   Special Requests   Final    BOTTLES DRAWN AEROBIC AND ANAEROBIC Blood Culture adequate volume   Culture   Final    NO GROWTH 4 DAYS Performed at [facility], [address], [place], [place] [address]    Report Status PENDING  Incomplete  Culture, blood (Routine X 2) w Reflex to ID Panel     Status: None (Preliminary result)   Collection Time: 07/13/23  6:20 PM   Specimen: BLOOD RIGHT HAND  Result Value Ref Range  Status   Specimen Description BLOOD RIGHT HAND RT THUMB  Final   Special Requests   Final    BOTTLES DRAWN AEROBIC ONLY Blood Culture results may not be optimal due to an inadequate volume of blood received in culture bottles   Culture   Final    NO GROWTH 4 DAYS Performed at [facility], [address], [place], [place] [address]    Report Status PENDING  Incomplete  MRSA Next Gen by PCR, Nasal      Status: None   Collection Time: 07/14/23  2:06 AM   Specimen: Nasal Mucosa; Nasal Swab  Result Value Ref Range Status   MRSA by PCR Next Gen NOT DETECTED NOT DETECTED Final    Comment: (NOTE) The GeneXpert MRSA Assay (FDA approved for NASAL specimens only), is one component of a comprehensive MRSA colonization surveillance program. It is not intended to diagnose MRSA infection nor to guide or monitor treatment for MRSA infections. Test performance is not FDA approved in patients less than 54 years old. Performed at [facility], [address], [place], [place] [address]     Anti-infectives:  Anti-infectives (From admission, onward)    Start     Dose/Rate Route Frequency Ordered Stop   07/17/23 1400  piperacillin-tazobactam (ZOSYN) IVPB 3.375 g        3.375 g 12.5 mL/hr over 240 Minutes Intravenous Every 8 hours 07/17/23 0753     07/17/23 0600  piperacillin-tazobactam (ZOSYN) IVPB 3.375 g        3.375 g 100 mL/hr over 30 Minutes Intravenous On call to O.R. 07/17/23 0349 07/17/23 0503   07/15/23 1045  amoxicillin-clavulanate (AUGMENTIN) 875-125 MG per tablet 1 tablet  Status:  Discontinued        1 tablet Oral Every 12 hours 07/15/23 0955 07/17/23 0753   07/13/23 1830  piperacillin-tazobactam (ZOSYN) IVPB 3.375 g  Status:  Discontinued        3.375 g 12.5 mL/hr over 240 Minutes Intravenous Every 8 hours 07/13/23 1743 07/15/23 0955   07/13/23 1815  cefTRIAXone (ROCEPHIN) 1 g in sodium chloride 0.9 % 100 mL IVPB  Status:  Discontinued        1 g 200 mL/hr over 30 Minutes Intravenous Every 24 hours 07/13/23 1728 07/13/23 1729   07/13/23 0715  ceFAZolin (ANCEF) IVPB 3g/150 mL premix        3 g 300 mL/hr over 30 Minutes Intravenous On call to O.R. 07/13/23 0700 07/13/23 0981        Consults: Treatment Team:  Sung Amabile, DO    PAST MEDICAL HISTORY   Past Medical History:  Diagnosis Date   Asthma    as a child   Bronchitis 03/2016   pt has recovered from  bronchitis   Chickenpox    Depression    Frequent headaches    GERD (gastroesophageal reflux disease)    when I was younger, better now.   Hypertension    Migraines    history of, not now   OSA (obstructive sleep apnea)      SURGICAL HISTORY   Past Surgical History:  Procedure Laterality Date   APPENDECTOMY  1977   HERNIA REPAIR     HYDROCELE EXCISION Right 06/01/2016   Procedure: HYDROCELECTOMY ADULT;  Surgeon: Vanna Scotland, MD;  Location: [facility] ORS;  Service: Urology;  Laterality: Right;   knee Left 1994   KNEE ARTHROSCOPY Right 1992   LAPAROTOMY N/A 07/13/2023   Procedure: LAPAROTOMY, EXPLORATORY;  Surgeon: Tonna Boehringer,  Isami, DO;  Location: [facility] ORS;  Service: General;  Laterality: N/A;  Pink pad for case is needed possilble open   UMBILICAL HERNIA REPAIR  2013   VASECTOMY N/A 06/01/2016   Procedure: VASECTOMY;  Surgeon: Vanna Scotland, MD;  Location: [facility] ORS;  Service: Urology;  Laterality: N/A;     FAMILY HISTORY   Family History  Problem Relation Age of Onset   Ovarian cancer Mother    Alcohol abuse Father    Hyperlipidemia Father    Hypertension Father    Arthritis Maternal Grandmother    Colon cancer Maternal Grandmother    Ovarian cancer Maternal Grandmother    Breast cancer Maternal Grandmother    Hyperlipidemia Maternal Grandfather    Hypertension Maternal Grandfather    Hyperlipidemia Paternal Grandmother    Heart attack Paternal Grandmother    Hyperlipidemia Paternal Grandfather    Hypertension Paternal Grandfather    Prostate cancer Maternal Uncle    Bladder Cancer Neg Hx    Kidney disease Neg Hx      SOCIAL HISTORY   Social History   Tobacco Use   Smoking status: Every Day    Current packs/day: 0.50    Types: Cigarettes   Smokeless tobacco: Never   Tobacco comments:    March 11 2016  Vaping Use   Vaping status: Never Used  Substance Use Topics   Alcohol use: Yes    Alcohol/week: 4.0 - 6.0 standard drinks of alcohol    Types: 4 - 6  Shots of liquor per week   Drug use: No     MEDICATIONS   Current Medication:  Current Facility-Administered Medications:    0.9 %  sodium chloride infusion (Manually program via Guardrails IV Fluids), , Intravenous, Once, Piscoya, Jose, MD, Held at 07/13/23 1900   0.9 %  sodium chloride infusion (Manually program via Guardrails IV Fluids), , Intravenous, Once, Piscoya, Jose, MD   0.9 %  sodium chloride infusion, , Intravenous, Continuous, Piscoya, Jose, MD, Stopped at 07/17/23 0707   acetaminophen (OFIRMEV) IV 1,000 mg, 1,000 mg, Intravenous, Q6H, Piscoya, Jose, MD   Chlorhexidine Gluconate Cloth 2 % PADS 6 each, 6 each, Topical, Daily, Piscoya, Jose, MD, 6 each at 07/17/23 1610   diphenhydrAMINE (BENADRYL) injection 25 mg, 25 mg, Intravenous, Q6H PRN, Piscoya, Jose, MD   fentaNYL (SUBLIMAZE) injection 100 mcg, 100 mcg, Intravenous, Once, Piscoya, Jose, MD   fentaNYL (SUBLIMAZE) injection 100 mcg, 100 mcg, Intravenous, Once, Piscoya, Jose, MD   fentaNYL in NS (32mcg/ml) infusion-PREMIX, 0-400 mcg/hr, Intravenous, Continuous, Piscoya, Jose, MD, Last Rate: 2.5 mL/hr at 07/17/23 0805, 25 mcg/hr at 07/17/23 0805   HYDROmorphone (DILAUDID) injection 1 mg, 1 mg, Intravenous, Q2H PRN, Piscoya, Jose, MD   ketorolac (TORADOL) 30 MG/ML injection 30 mg, 30 mg, Intravenous, Q6H PRN, Piscoya, Jose, MD   norepinephrine (LEVOPHED) 4mg  in (0.016 mg/mL) premix infusion, 0-40 mcg/min, Intravenous, Titrated, Piscoya, Jose, MD   ondansetron (ZOFRAN-ODT) disintegrating tablet 4 mg, 4 mg, Oral, Q6H PRN **OR** ondansetron (ZOFRAN) injection 4 mg, 4 mg, Intravenous, Q6H PRN, Piscoya, Jose, MD, 4 mg at 07/16/23 1931   Oral care mouth rinse, 15 mL, Mouth Rinse, PRN, Piscoya, Jose, MD   pantoprazole (PROTONIX) injection 40 mg, 40 mg, Intravenous, Q24H, Piscoya, Jose, MD   piperacillin-tazobactam (ZOSYN) IVPB 3.375 g, 3.375 g, Intravenous, Q8H, Burnis Medin D, RPH   propofol (DIPRIVAN) 1000  MG/100ML infusion, 5-80 mcg/kg/min, Intravenous, Titrated, Piscoya, Jose, MD, Last Rate: 31 mL/hr at 07/17/23 0744, 30 mcg/kg/min  at 07/17/23 0744   rocuronium (ZEMURON) 100 MG/10ML injection, , , ,    sodium chloride flush (NS) 0.9 % injection 10-40 mL, 10-40 mL, Intracatheter, Q12H, Piscoya, Jose, MD, 10 mL at 07/17/23 0807   sodium chloride flush (NS) 0.9 % injection 10-40 mL, 10-40 mL, Intracatheter, PRN, Piscoya, Jose, MD   tamsulosin (FLOMAX) capsule 0.4 mg, 0.4 mg, Oral, QPC supper, Piscoya, Jose, MD, 0.4 mg at 07/16/23 1712    ALLERGIES   Sulfa antibiotics    REVIEW OF SYSTEMS     10 point ROS Unable to complete due to MV and sedation.  PHYSICAL EXAMINATION   Vital Signs: Temp:  [97.8 F (36.6 C)-98.8 F (37.1 C)] 98.1 F (36.7 C) (03/22 0803) Pulse Rate:  [76-157] 129 (03/22 0749) Resp:  [11-22] 18 (03/22 0749) BP: (104-214)/(54-114) 125/54 (03/22 0749) SpO2:  [94 %-100 %] 95 % (03/22 0749) FiO2 (%):  [60 %] 60 % (03/22 0315) Weight:  [176.2 kg] 176.2 kg (03/22 0731)  GENERAL:Mild distress and confusion  HEAD: Normocephalic, atraumatic.  EYES: Pupils equal, round, reactive to light.  No scleral icterus.  MOUTH: Moist mucosal membrane. NECK: Supple. No thyromegaly. No nodules. No JVD.  PULMONARY: wheezing and rhonchi bilaterally  CARDIOVASCULAR: S1 and S2. Regular rate and rhythm. No murmurs, rubs, or gallops.  GASTROINTESTINAL: Soft, post surgery- tender without peritoneal signs, distended. No masses. Positive bowel sounds. No hepatosplenomegaly.  MUSCULOSKELETAL: No swelling, clubbing, or edema.  NEUROLOGIC:GCS4T SKIN:intact,warm,dry   PERTINENT DATA     Infusions:  sodium chloride Stopped (07/17/23 0707)   acetaminophen     fentaNYL infusion INTRAVENOUS 25 mcg/hr (07/17/23 0805)   norepinephrine (LEVOPHED) Adult infusion     piperacillin-tazobactam (ZOSYN)  IV     propofol (DIPRIVAN) infusion 30 mcg/kg/min (07/17/23 0744)   Scheduled  Medications:  sodium chloride   Intravenous Once   sodium chloride   Intravenous Once   Chlorhexidine Gluconate Cloth  6 each Topical Daily   fentaNYL (SUBLIMAZE) injection  100 mcg Intravenous Once   fentaNYL (SUBLIMAZE) injection  100 mcg Intravenous Once   pantoprazole (PROTONIX) IV  40 mg Intravenous Q24H   rocuronium       sodium chloride flush  10-40 mL Intracatheter Q12H   tamsulosin  0.4 mg Oral QPC supper   PRN Medications: diphenhydrAMINE, HYDROmorphone (DILAUDID) injection, ketorolac, ondansetron **OR** ondansetron (ZOFRAN) IV, mouth rinse, rocuronium, sodium chloride flush Hemodynamic parameters:   Intake/Output: 03/21 0701 - 03/22 0700 In: 2210 [P.O.:360; I.V.:1800; IV Piggyback:50] Out: 4590 [Urine:1250; Drains:240]  Ventilator  Settings: Vent Mode: PRVC FiO2 (%):  [60 %] 60 % Set Rate:  [18 bmp] 18 bmp Vt Set:  [550 mL] 550 mL PEEP:  [5 cmH20] 5 cmH20   LAB RESULTS:  Basic Metabolic Panel: Recent Labs  Lab 07/13/23 1533 07/14/23 0358 07/15/23 0610 07/16/23 0526 07/17/23 0242  NA 137 135 135 138 134*  K 4.5 4.5 4.7 4.4 3.9  CL 108 108 106 105 101  CO2 22 21* 24 26 24   GLUCOSE 155* 149* 142* 105* 117*  BUN 16 23* 30* 23* 24*  CREATININE 1.10 1.29* 1.13 0.89 0.94  CALCIUM 8.6* 8.5* 8.6* 9.1 9.4  MG  --  1.6* 2.3 2.1 2.1  PHOS  --  3.1 3.4 3.7  --    Liver Function Tests: Recent Labs  Lab 07/13/23 1533 07/14/23 0358 07/15/23 0610 07/16/23 0526 07/17/23 0242  AST 33  --   --   --  30  ALT 17  --   --   --  19  ALKPHOS 79  --   --   --  64  BILITOT 2.0*  --   --   --  2.1*  PROT 5.9*  --   --   --  6.6  ALBUMIN 2.7* 2.9* 3.0* 3.1* 3.2*   No results for input(s): "LIPASE", "AMYLASE" in the last 168 hours. No results for input(s): "AMMONIA" in the last 168 hours. CBC: Recent Labs  Lab 07/13/23 1651 07/13/23 2204 07/14/23 0358 07/15/23 0610 07/16/23 0526 07/17/23 0242  WBC 15.5*  --  10.5 15.2* 12.8* 6.7  NEUTROABS 13.9*  --   --   --    --  5.0  HGB 14.5 13.9 13.0 13.1 13.2 14.2  HCT 41.4 40.0 38.1* 37.8* 38.7* 41.4  MCV 101.5*  --  102.7* 102.2* 101.0* 102.0*  PLT 147*  --  116* 116* 119* 107*   Cardiac Enzymes: No results for input(s): "CKTOTAL", "CKMB", "CKMBINDEX", "TROPONINI" in the last 168 hours. BNP: Invalid input(s): "POCBNP" CBG: Recent Labs  Lab 07/13/23 1527  GLUCAP 157*       IMAGING RESULTS:      ASSESSMENT AND PLAN    -Multidisciplinary rounds held today  Acute respiratory distress with hypoxemia -s/p TRALI with post FFP/platelet infusion - resolved post full scope of therapy -now coming in with reps distress due to abd wound fascial dehisence and evisiration -continue full MV support on PRVC per ARDSnet low PEEP ladder protocol    Alcoholism history   - previous admission for ETOH withdrawal-states he has not drank in many months   - CIWA protocol    - thiamine and folate      Post inguinal hernia fascial dehisence   - surgery on case appreciate input - JP drain slowing down, h/h stable     - s/p revision closure of abdominal wound    - continue zosyn  ID -continue IV abx as prescibed -follow up cultures  GI/Nutrition GI PROPHYLAXIS as indicated DIET-->TF's as tolerated Constipation protocol as indicated  ENDO - ICU hypoglycemic\Hyperglycemia protocol -check FSBS per protocol   ELECTROLYTES -follow labs as needed -replace as needed -pharmacy consultation   DVT/GI PRX ordered -SCDs  TRANSFUSIONS AS NEEDED MONITOR FSBS ASSESS the need for LABS as needed    Critical care provider statement:   Total critical care time: 32  minutes   Performed by: Karna Christmas MD   Critical care time was exclusive of separately billable procedures and treating other patients.   Critical care was necessary to treat or prevent imminent or life-threatening deterioration.   Critical care was time spent personally by me on the following activities: development of treatment plan  with patient and/or surrogate as well as nursing, discussions with consultants, evaluation of patient's response to treatment, examination of patient, obtaining history from patient or surrogate, ordering and performing treatments and interventions, ordering and review of laboratory studies, ordering and review of radiographic studies, pulse oximetry and re-evaluation of patient's condition.    Vida Rigger, M.D.  Pulmonary & Critical Care Medicine

## 2023-07-17 NOTE — Progress Notes (Signed)
 OT Cancellation Note  Patient Details Name: [name] MRN: [id number] DOB: [birth year]   Cancelled Treatment:     Pt with medical event overnight with small bowel evisceration, required intubation and transferred to ICU.  Pt will require new OT orders when medically stable and if OT is indicated for reevaluation.    Varsha Knock T Dario Yono, OTR/L, CLT Refoel Palladino 07/17/2023, 8:40 AM

## 2023-07-17 NOTE — Progress Notes (Signed)
 07/17/23  Called to come to bedside as patient had fascial dehiscence with small bowel evisceration.  He had been more nauseous earlier today and had bilious emesis.  He had KUB showing small bowel distention.  Later on, he was coughing very hard and had fascial dehiscence with evisceration.  Rapid response was called and the patient was transferred to the ICU, as he was becoming more diaphoretic.  He required intubation and was noted to have gastric secretions in his oropharynx.  Central line has been placed as well.    Discussed with the patient's daughter regarding the events tonight and the need for emergent takeback to the OR for exploratory laparotomy and abdominal closure.  Discussed likely washing out his abdomen, making sure there are no other issues ongoing, milking small bowel contents up to the NG tube to help with decompression, and abdominal closure. Discussed the use of retention sutures as well.  She understands this and is in agreement to proceed.  Will take back to OR now.  Due to likely aspiration event, will also start him on IV Zosyn.  Henrene Dodge, MD

## 2023-07-17 NOTE — Progress Notes (Signed)
   07/17/23 0220  Spiritual Encounters  Type of Visit Initial  Care provided to: Pt and family  Conversation partners present during encounter Nurse;Physician  Referral source Code page  Reason for visit Code  OnCall Visit Yes  Spiritual Framework  Presenting Themes Goals in life/care;Caregiving needs  Interventions  Spiritual Care Interventions Made Established relationship of care and support;Compassionate presence;Reflective listening;Normalization of emotions  Intervention Outcomes  Outcomes Connection to spiritual care;Awareness around self/spiritual resourses   Chaplain responded to code. Patient taken to ICU to be prepared for surgery. Chaplain waited in ED for patient's daughter, Alcario Drought, to arrive and sat with her in ICU waiting room.

## 2023-07-17 NOTE — Progress Notes (Signed)
 Patient taken to OR.

## 2023-07-17 NOTE — Transfer of Care (Signed)
 Immediate Anesthesia Transfer of Care Note  Patient: [name]  Procedure(s) Performed: LAPAROTOMY, EXPLORATORY, ABDOMINAL WASHOUT WITH CLOSURE  Patient Location: ICU  Anesthesia Type:General  Level of Consciousness: sedated and Patient remains intubated per anesthesia plan  Airway & Oxygen Therapy: Patient remains intubated per anesthesia plan and Patient placed on Ventilator (see vital sign flow sheet for setting)  Post-op Assessment: Report given to RN and Post -op Vital signs reviewed and stable  Post vital signs: Reviewed and stable  Last Vitals:  Vitals Value Taken Time  BP 125/54 07/17/23 0749  Temp 36.7 C 07/17/23 0731  Pulse 122 07/17/23 0751  Resp 18 07/17/23 0751  SpO2 92 % 07/17/23 0751  Vitals shown include unfiled device data.  Last Pain:  Vitals:   07/17/23 0731  TempSrc: Axillary  PainSc:       Patients Stated Pain Goal: 3 (07/14/23 0913)  Complications: No notable events documented.

## 2023-07-17 NOTE — Anesthesia Preprocedure Evaluation (Addendum)
 Anesthesia Evaluation  Patient identified by MRN, date of birth, ID band Patient unresponsive    Reviewed: Allergy & Precautions, NPO status , Patient's Chart, lab work & pertinent test results  History of Anesthesia Complications Negative for: history of anesthetic complications  Airway Mallampati: Intubated       Dental  (+) Chipped   Pulmonary asthma , sleep apnea , pneumonia, unresolved, Current Smoker and Patient abstained from smoking.   + rhonchi  + decreased breath sounds      Cardiovascular hypertension, Normal cardiovascular exam     Neuro/Psych  Headaches PSYCHIATRIC DISORDERS         GI/Hepatic Neg liver ROS,GERD  ,,  Endo/Other  negative endocrine ROS    Renal/GU      Musculoskeletal   Abdominal   Peds  Hematology negative hematology ROS (+)   Anesthesia Other Findings By report patient vomited and aspirated while the ICU team was emergently intubating him.  Past Medical History: No date: Asthma     Comment:  as a child 03/2016: Bronchitis     Comment:  pt has recovered from bronchitis No date: Chickenpox No date: Depression No date: Frequent headaches No date: GERD (gastroesophageal reflux disease)     Comment:  when I was younger, better now. No date: Hypertension No date: Migraines     Comment:  history of, not now No date: OSA (obstructive sleep apnea)  Past Surgical History: 1977: APPENDECTOMY No date: HERNIA REPAIR 06/01/2016: HYDROCELE EXCISION; Right     Comment:  Procedure: HYDROCELECTOMY ADULT;  Surgeon: Vanna Scotland, MD;  Location: [facility] ORS;  Service: Urology;                Laterality: Right; 1994: knee; Left 1992: KNEE ARTHROSCOPY; Right 07/13/2023: LAPAROTOMY; N/A     Comment:  Procedure: LAPAROTOMY, EXPLORATORY;  Surgeon: Sung Amabile, DO;  Location: [facility] OR[facility];  Service: General;                Laterality: N/A;  Pink pad for case is  needed possilble               open 2013: UMBILICAL HERNIA REPAIR 06/01/2016: VASECTOMY; N/A     Comment:  Procedure: VASECTOMY;  Surgeon: Vanna Scotland, MD;                Location: [facility] OR[facility];  Service: Urology;  Laterality: N/A;  BMI    Body Mass Index: 48.80 kg/m      Reproductive/Obstetrics negative OB ROS                              Anesthesia Physical Anesthesia Plan  ASA: 5 and emergent  Anesthesia Plan: General ETT   Post-op Pain Management:    Induction: Intravenous  PONV Risk Score and Plan: Ondansetron, Dexamethasone, Midazolam and Treatment may vary due to age or medical condition  Airway Management Planned: Oral ETT  Additional Equipment:   Intra-op Plan:   Post-operative Plan: Post-operative intubation/ventilation  Informed Consent: I have reviewed the patients History and Physical, chart, labs and discussed the procedure including the risks, benefits and alternatives for the proposed anesthesia with the patient or authorized representative who has indicated his/her understanding and acceptance.     Dental Advisory Given  Plan Discussed with: Anesthesiologist,  CRNA and Surgeon  Anesthesia Plan Comments: (Daughter consented for risks of anesthesia including but not limited to:  - adverse reactions to medications - damage to eyes, teeth, lips or other oral mucosa - nerve damage due to positioning  - sore throat or hoarseness - Damage to heart, brain, nerves, lungs, other parts of body or loss of life  She voiced understanding and assent.)        Anesthesia Quick Evaluation

## 2023-07-17 NOTE — Progress Notes (Signed)
 Blood glucose level 112. Did not flow over in chart.

## 2023-07-17 NOTE — Progress Notes (Signed)
 Patient here from room 230 s/p rapid response. Report given at bedside by Penni Bombard, RN. Webb Silversmith, NP and Manuela Schwartz, NP at bedside. Patient only has 1 IV access. Multiple attempts made to obtain more access, 2 20g PIVs placed for RSI medications and intubation.  0300 RSI meds pushed, (L AC) IV blew, patient maintained vitals, including O2 saturation of 98-100% without supplemental oxygen. Second IV (R AC) checked, blew with flush. 20g in L FA currently infusing NS @ 143ml/hr looks questionable. Lanora Manis, NP places a 20g EJ right side while Asher Muir, RN places an IO to L shoulder.  0310 RSI meds pushed in REJ. Successful intubation #8 ETT @ 26 teeth. Positive color change.  0345 Central line obtained to [facility], X-ray confirmed placement of ETT and line. Daughter was brought back to the room to see the patient prior to going to OP

## 2023-07-17 NOTE — Plan of Care (Signed)
  Problem: Education: Goal: Knowledge of General Education information will improve Description: Including pain rating scale, medication(s)/side effects and non-pharmacologic comfort measures Outcome: Progressing   Problem: Health Behavior/Discharge Planning: Goal: Ability to manage health-related needs will improve Outcome: Progressing   Problem: Clinical Measurements: Goal: Ability to maintain clinical measurements within normal limits will improve Outcome: Progressing Goal: Will remain free from infection Outcome: Progressing Goal: Diagnostic test results will improve Outcome: Progressing Goal: Respiratory complications will improve Outcome: Progressing Goal: Cardiovascular complication will be avoided Outcome: Progressing   Problem: Activity: Goal: Risk for activity intolerance will decrease Outcome: Progressing   Problem: Nutrition: Goal: Adequate nutrition will be maintained Outcome: Progressing   Problem: Coping: Goal: Level of anxiety will decrease Outcome: Progressing   Problem: Elimination: Goal: Will not experience complications related to urinary retention Outcome: Progressing   Problem: Pain Managment: Goal: General experience of comfort will improve and/or be controlled Outcome: Progressing   Problem: Safety: Goal: Ability to remain free from injury will improve Outcome: Progressing   Problem: Skin Integrity: Goal: Risk for impaired skin integrity will decrease Outcome: Progressing   Problem: Education: Goal: Ability to describe self-care measures that may prevent or decrease complications (Diabetes Survival Skills Education) will improve Outcome: Progressing Goal: Individualized Educational Video(s) Outcome: Progressing   Problem: Fluid Volume: Goal: Ability to maintain a balanced intake and output will improve Outcome: Progressing   Problem: Health Behavior/Discharge Planning: Goal: Ability to identify and utilize available resources and  services will improve Outcome: Progressing Goal: Ability to manage health-related needs will improve Outcome: Progressing   Problem: Metabolic: Goal: Ability to maintain appropriate glucose levels will improve Outcome: Progressing   Problem: Nutritional: Goal: Maintenance of adequate nutrition will improve Outcome: Progressing Goal: Progress toward achieving an optimal weight will improve Outcome: Progressing   Problem: Skin Integrity: Goal: Risk for impaired skin integrity will decrease Outcome: Progressing   Problem: Tissue Perfusion: Goal: Adequacy of tissue perfusion will improve Outcome: Progressing   Problem: Elimination: Goal: Will not experience complications related to bowel motility Outcome: Not Progressing   Problem: Coping: Goal: Ability to adjust to condition or change in health will improve Outcome: Not Progressing

## 2023-07-17 NOTE — Progress Notes (Signed)
 PHARMACY - TOTAL PARENTERAL NUTRITION CONSULT NOTE   Indication: Prolonged ileus  Patient Measurements: Height: 6\' 2"  (188 cm) Weight: (!) 176.2 kg (388 lb 7.2 oz) IBW/kg (Calculated) : 82.2 TPN AdjBW (KG): 101.3 Body mass index is 49.87 kg/m.  Assessment:  57 y.o. male w/ PMH of morbid obesity, mild intermittent asthma, right inguinal hernia, GERD, OSA, migraines admitted on 07/13/2023 with a ventral hernia.   Glucose / Insulin: no insulin ordered or required Electrolytes: slight hyponatremia Renal: SCr <1 Hepatic: bilirubin elevated Intake / Output; MIVF: 0.9 %  sodium chloride at 100 mL/hr GI Imaging: 03/22 abd Xray Dilatation of small bowel in the mid to lower abdomen, Mild to moderate fecal stasis   GI Surgeries / Procedures:  03/22 Exploratory Laparotomy, abdominal washout, abdominal closure   Central access: 07/16/23 TPN start date: 07/17/23  Current Nutrition:  NPO  Plan:  ---Start E8/10 TPN at 42 mL/hr at 1800 ---Electrolytes in TPN: Na 53mEq/L, K 69mEq/L, Ca 4.4mEq/L, Mg 5mEq/L, and Phos 4mmol/L. Cl:Ac 0.49 ---Add standard MVI, thiamine 100mg  and trace elements to TPN ---Initiate Sensitive q6h SSI and adjust as needed  ---Reduce MIVF to 60 mL/hr at 1800 ---Monitor TPN labs on Mon/Thurs, daily when stable  Lowella Bandy 07/17/2023,7:50 AM

## 2023-07-17 NOTE — Progress Notes (Signed)
 Patient complained of nausea at the start of my shift, prn zofran administered. 3 hours later patient complained again of nausea, on call provider Jon Billings notified and ordered compazine. Patient was dry heaving and then vomited a small amount of bile. Provider notified and came to bedside to see patient. Provider ordered abdominal x ray. After x ray patient was still nauseous and not feeling well. Provider ordered nasogastric tube. This RN went to place nasogastric tube at the start of insertion patient yelled out "my stomach is about to burst" This RN stopped insertion and looked down and patient intestines had popped out of surgical site. Rapid response called. Provider at bedside. Patient transferred to ICU bed 8.

## 2023-07-17 NOTE — Op Note (Addendum)
 Procedure Date:  07/17/2023  Pre-operative Diagnosis:  Fascial dehiscence with small bowel evisceration  Post-operative Diagnosis: Fascial dehiscence with small bowel evisceration  Procedure:  Exploratory Laparotomy, abdominal washout, abdominal closure with retention sutures  Surgeon:  Howie Ill, MD  Anesthesia:  General endotracheal  Estimated Blood Loss:  50 ml  Specimens:  None  Complications:  None  Indications for Procedure:  This is a 58 y.o. male s/p recent exploratory laparotomy, with acute midline incision fascial dehiscence with small bowel evisceration.  The patient had already been intubated prior to my arrival.  I discussed with the patient's daughter about taking him back to the OR.  The risks of bleeding, abscess or infection, injury to surrounding structures, and need for further procedures were all discussed with the patient and was willing to proceed.  Description of Procedure: The patient was correctly identified in the preoperative area and brought into the operating room.  The patient was placed supine with VTE prophylaxis in place.  Appropriate time-outs were performed.  Foley catheter was placed.  Appropriate antibiotics were infused.  The abdomen was prepped and draped in a sterile fashion.  Prior [place] drain was removed.  The eviscerated bowel was pushed to the side to expose the midline incision.  Loose staples were removed.  The superior and inferior aspects of the skin incision were opened, cutting through dermal sutures exposing better the subcutaneous tissue.  The fascial incision was also extended about 3 cm superiorly and inferiorly in order to have better exposure.  In doing so, it was noted that the patient's fascial closure sutures had completely torn through.  On the left aspect of the fascial edge, the anterior and posterior rectus sheaths had been separated, exposing the rectus muscle, likely from the tearing of the sutures through it.  NG tube  was palpated to be in the stomach in good position.  The small bowel was further eviscerated from the abdominal cavity to make sure there were no kinks or injuries.  There were minimal adhesions between bowel loops which were taken down bluntly and with cautery.  Once the full small bowel was exposed, we started milking the contents of the small bowel proximally towards the stomach.  This took a very long time given the bowel distention and the thickness of the contents which was getting the NG tube clogged.  We did exchange the NG tube for a larger 18 French tube as well which helped with the decompression.  Overall approximately 2 L of gastric contents were removed.  Once the bowel was further decompressed, we were able to evaluate the rest of the abdominal cavity.  There is a couple of areas of oozing from the omentum which were controlled with cautery.  After this, the abdominal cavity was thoroughly irrigated with warm saline followed by 1 L of Vashe solution.  Then a 68 [place] drain was inserted via the right lateral abdominal wall going into the upper abdomen towards the area that had been oozing from the omentum.  Exparel solution mixed with 0.5% bupivacaine with epi was infiltrated over the peritoneum, fascia, and subcutaneous tissue.  #1 Prolene sutures were then used to get four retention sutures going from mid rectus on one side to mid rectus on the opposite side, evenly spaced along the midline incision.  The fascia was then closed using #1 PDS sutures.  The midline wound was irrigated and closed using 3-0 Vicryl and skin staples.  There is a tension sutures were then  closed over the midline incision through red rubber catheter bumpers and tied in place.  The drain was secured using 3-0 nylon suture.  The wound was dressed with Honeycomb dressing and the drain with 4x4 gauze and tegaderm.   The patient remained intubated and sedated and was brought back to the intensive care unit for further  management.  The patient tolerated the procedure well and all counts were correct at the end of the case.   Howie Ill, MD

## 2023-07-18 LAB — CULTURE, BLOOD (ROUTINE X 2)
Culture: NO GROWTH
Culture: NO GROWTH
Special Requests: ADEQUATE

## 2023-07-18 LAB — TYPE AND SCREEN
ABO/RH(D): O POS
Antibody Screen: NEGATIVE
Unit division: 0
Unit division: 0

## 2023-07-18 LAB — GLUCOSE, CAPILLARY
Glucose-Capillary: 120 mg/dL — ABNORMAL HIGH (ref 70–99)
Glucose-Capillary: 122 mg/dL — ABNORMAL HIGH (ref 70–99)
Glucose-Capillary: 124 mg/dL — ABNORMAL HIGH (ref 70–99)
Glucose-Capillary: 127 mg/dL — ABNORMAL HIGH (ref 70–99)
Glucose-Capillary: 134 mg/dL — ABNORMAL HIGH (ref 70–99)
Glucose-Capillary: 146 mg/dL — ABNORMAL HIGH (ref 70–99)

## 2023-07-18 LAB — BPAM RBC
Blood Product Expiration Date: 202504152359
Blood Product Expiration Date: 202504152359
Unit Type and Rh: 5100
Unit Type and Rh: 5100

## 2023-07-18 LAB — CBC
HCT: 38.1 % — ABNORMAL LOW (ref 39.0–52.0)
Hemoglobin: 13 g/dL (ref 13.0–17.0)
MCH: 35.4 pg — ABNORMAL HIGH (ref 26.0–34.0)
MCHC: 34.1 g/dL (ref 30.0–36.0)
MCV: 103.8 fL — ABNORMAL HIGH (ref 80.0–100.0)
Platelets: 128 10*3/uL — ABNORMAL LOW (ref 150–400)
RBC: 3.67 MIL/uL — ABNORMAL LOW (ref 4.22–5.81)
RDW: 14.1 % (ref 11.5–15.5)
WBC: 17.2 10*3/uL — ABNORMAL HIGH (ref 4.0–10.5)
nRBC: 0.1 % (ref 0.0–0.2)

## 2023-07-18 LAB — BPAM FFP
Blood Product Expiration Date: 202503222359
Unit Type and Rh: 6200

## 2023-07-18 LAB — RENAL FUNCTION PANEL
Albumin: 2.4 g/dL — ABNORMAL LOW (ref 3.5–5.0)
Anion gap: 8 (ref 5–15)
BUN: 42 mg/dL — ABNORMAL HIGH (ref 6–20)
CO2: 23 mmol/L (ref 22–32)
Calcium: 7.7 mg/dL — ABNORMAL LOW (ref 8.9–10.3)
Chloride: 102 mmol/L (ref 98–111)
Creatinine, Ser: 1.69 mg/dL — ABNORMAL HIGH (ref 0.61–1.24)
GFR, Estimated: 47 mL/min — ABNORMAL LOW (ref >60)
Glucose, Bld: 139 mg/dL — ABNORMAL HIGH (ref 70–99)
Phosphorus: 4.7 mg/dL — ABNORMAL HIGH (ref 2.5–4.6)
Potassium: 4 mmol/L (ref 3.5–5.1)
Sodium: 133 mmol/L — ABNORMAL LOW (ref 135–145)

## 2023-07-18 LAB — LACTIC ACID, PLASMA
Lactic Acid, Venous: 1.6 mmol/L (ref 0.5–1.9)
Lactic Acid, Venous: 1.8 mmol/L (ref 0.5–1.9)

## 2023-07-18 LAB — PREPARE FRESH FROZEN PLASMA

## 2023-07-18 LAB — MAGNESIUM: Magnesium: 1.9 mg/dL (ref 1.7–2.4)

## 2023-07-18 LAB — TRIGLYCERIDES: Triglycerides: 60 mg/dL (ref <150)

## 2023-07-18 MED ORDER — LORAZEPAM 2 MG/ML IJ SOLN: 1.0000 mg | INTRAMUSCULAR | Status: DC | PRN

## 2023-07-18 MED ORDER — THIAMINE HCL 100 MG/ML IJ SOLN
100.0000 mg | Freq: Every day | INTRAMUSCULAR | Status: DC
  Administered 2023-07-19 – 2023-07-22 (×3): 100 mg via INTRAVENOUS
  Filled 2023-07-18 (×4): qty 2

## 2023-07-18 MED ORDER — ACETAMINOPHEN 10 MG/ML IV SOLN
1000.0000 mg | Freq: Three times a day (TID) | INTRAVENOUS | Status: AC | PRN
  Administered 2023-07-18 – 2023-07-19 (×2): 1000 mg via INTRAVENOUS
  Filled 2023-07-18 (×2): qty 100

## 2023-07-18 MED ORDER — THIAMINE MONONITRATE 100 MG PO TABS
100.0000 mg | ORAL_TABLET | Freq: Every day | ORAL | Status: DC
  Administered 2023-07-21 – 2023-08-05 (×15): 100 mg via ORAL
  Filled 2023-07-18 (×15): qty 1

## 2023-07-18 MED ORDER — NOREPINEPHRINE 16 MG/250ML-% IV SOLN
0.0000 ug/min | INTRAVENOUS | Status: DC
  Administered 2023-07-18: 27 ug/min via INTRAVENOUS
  Administered 2023-07-19: 22 ug/min via INTRAVENOUS
  Administered 2023-07-19: 12 ug/min via INTRAVENOUS
  Filled 2023-07-18 (×4): qty 250

## 2023-07-18 MED ORDER — LORAZEPAM 1 MG PO TABS: 1.0000 mg | ORAL_TABLET | ORAL | Status: DC | PRN

## 2023-07-18 MED ORDER — FOLIC ACID 1 MG PO TABS
1.0000 mg | ORAL_TABLET | Freq: Every day | ORAL | Status: DC
  Administered 2023-07-19 – 2023-08-05 (×18): 1 mg via ORAL
  Filled 2023-07-18 (×17): qty 1

## 2023-07-18 MED ORDER — TRACE MINERALS CU-MN-SE-ZN 300-55-60-3000 MCG/ML IV SOLN
INTRAVENOUS | Status: AC
  Filled 2023-07-18: qty 2000

## 2023-07-18 MED ORDER — ADULT MULTIVITAMIN W/MINERALS CH
1.0000 | ORAL_TABLET | Freq: Every day | ORAL | Status: DC
  Administered 2023-07-19 – 2023-08-05 (×18): 1 via ORAL
  Filled 2023-07-18 (×17): qty 1

## 2023-07-18 MED ORDER — HYDROCOD POLI-CHLORPHE POLI ER 10-8 MG/5ML PO SUER
10.0000 mL | Freq: Three times a day (TID) | ORAL | Status: DC
  Administered 2023-07-18 – 2023-07-24 (×18): 10 mL
  Filled 2023-07-18 (×17): qty 10

## 2023-07-18 MED ORDER — FAT EMUL FISH OIL/PLANT BASED 20% (SMOFLIPID)IV EMUL
250.0000 mL | INTRAVENOUS | Status: AC
  Administered 2023-07-18 (×2): 250 mL via INTRAVENOUS
  Filled 2023-07-18: qty 250

## 2023-07-18 NOTE — Progress Notes (Signed)
 CRITICAL CARE     Name: [name] MRN: [id number] DOB: 12-08-1965     LOS: 5   SUBJECTIVE FINDINGS & SIGNIFICANT EVENTS    History of Presenting Illness:   58 yo M with hx of obesity , OSA, , EtOH abuse hx of admission to hospital for ETOH withdrawal.  He came in for elective surgery to have inguinal hernia repair with lysis of adhesions.  He had significant bleeding per surgeon which was unusual and required electrocautery and ligature for hemostasis.  He was brought back with abdominal JP drain and had FFP and platelets ordered due to intraoperative bleeding.  After receiving these blood products patient deteriorated and developed circulatory shock, hypoxemia, swelling worse on lower extremities.  He had code RRT called and was emergently brought to ICU due to shock with hypoxemia. He was treated for anaphylactic non febrile blood product reaction vs TRALI and improved overnight.  He became stable and was downgraded to medical floor.    Ive discussed case with Blood bank - [facility] -Interim charge and Jimmy Picket -Director of blood bank, we discussed patients reaction and there will be additional workup for this anaphylactic reaction vs Transfusion related acute lung injury.    07/17/23- patient had code RRT overnight, he had findings of small bowel evisceration secondary to fascial dehiscence.  This likely stemmed from forceful cough per surgery service.  He became unstable in distress and required endotracheal intubation.  He was taken to OR emergently for ex-lap and closure of abdominal cavity.  Daughter was available to come in and we reviewed medical plan.  07/18/23- extubated but reports severe pain. Daughter at bedside and patient is improved able to speak in full sentences.  Reviewed case with surgery  and placed abdominal binder.  He is on CIWA due to reported daily drinking.    Lines/tubes : Closed System Drain Left LUQ Bulb (JP) 19 Fr. (Active)  Dressing Status Clean, Dry, Intact 07/13/23 1458  Output (mL) 40 mL 07/13/23 1458     Urethral Catheter morgan Double-lumen 16 Fr. (Active)  Site Assessment Clean, Dry, Intact 07/13/23 1458  Output (mL) 175 mL 07/13/23 1458    Microbiology/Sepsis markers: Results for orders placed or performed during the hospital encounter of 07/13/23  Urine Culture     Status: None   Collection Time: 07/13/23  4:51 PM   Specimen: Urine, Random  Result Value Ref Range Status   Specimen Description   Final    URINE, RANDOM Performed at [facility], [address], [place], [place] [address]    Special Requests   Final    NONE Reflexed from [id number] Performed at [facility], [address] [place], [place] [address]    Culture   Final    NO GROWTH Performed at [facility], [address] [place] [address] [place], [place] [address]    Report Status 07/14/2023 FINAL  Final  Culture, blood (Routine X 2) w Reflex to ID Panel     Status: None   Collection Time: 07/13/23  5:59 PM   Specimen: Right Antecubital; Blood  Result Value Ref Range Status   Specimen Description RIGHT ANTECUBITAL  Final   Special Requests   Final    BOTTLES DRAWN AEROBIC AND ANAEROBIC Blood Culture adequate volume   Culture   Final    NO GROWTH 5 DAYS Performed at [facility], [address], [place], [place] [address]    Report Status 07/18/2023 FINAL  Final  Culture, blood (Routine X 2) w Reflex to ID Panel     Status: None   Collection Time: 07/13/23  6:20 PM   Specimen: BLOOD RIGHT HAND  Result Value Ref Range Status   Specimen Description BLOOD RIGHT HAND RT THUMB  Final   Special Requests   Final    BOTTLES DRAWN AEROBIC ONLY Blood Culture results may not be optimal due to an inadequate volume of blood received in culture bottles   Culture    Final    NO GROWTH 5 DAYS Performed at [facility], [address], [place], [place] [address]    Report Status 07/18/2023 FINAL  Final  MRSA Next Gen by PCR, Nasal     Status: None   Collection Time: 07/14/23  2:06 AM   Specimen: Nasal Mucosa; Nasal Swab  Result Value Ref Range Status   MRSA by PCR Next Gen NOT DETECTED NOT DETECTED Final    Comment: (NOTE) The GeneXpert MRSA Assay (FDA approved for NASAL specimens only), is one component of a comprehensive MRSA colonization surveillance program. It is not intended to diagnose MRSA infection nor to guide or monitor treatment for MRSA infections. Test performance is not FDA approved in patients less than 4 years old. Performed at [facility], [address], [place], [place] [address]     Anti-infectives:  Anti-infectives (From admission, onward)    Start     Dose/Rate Route Frequency Ordered Stop   07/17/23 1400  piperacillin-tazobactam (ZOSYN) IVPB 3.375 g        3.375 g 12.5 mL/hr over 240 Minutes Intravenous Every 8 hours 07/17/23 0753     07/17/23 0600  piperacillin-tazobactam (ZOSYN) IVPB 3.375 g        3.375 g 100 mL/hr over 30 Minutes Intravenous On call to O.R. 07/17/23 0349 07/17/23 0503   07/15/23 1045  amoxicillin-clavulanate (AUGMENTIN) 875-125 MG per tablet 1 tablet  Status:  Discontinued        1 tablet Oral Every 12 hours 07/15/23 0955 07/17/23 0753   07/13/23 1830  piperacillin-tazobactam (ZOSYN) IVPB 3.375 g  Status:  Discontinued        3.375 g 12.5 mL/hr over 240 Minutes Intravenous Every 8 hours 07/13/23 1743 07/15/23 0955   07/13/23 1815  cefTRIAXone (ROCEPHIN) 1 g in sodium chloride 0.9 % 100 mL IVPB  Status:  Discontinued        1 g 200 mL/hr over 30 Minutes Intravenous Every 24 hours 07/13/23 1728 07/13/23 1729   07/13/23 0715  ceFAZolin (ANCEF) IVPB 3g/150 mL premix        3 g 300 mL/hr over 30 Minutes Intravenous On call to O.R. 07/13/23 0700 07/13/23 0981         Consults: Treatment Team:  Sung Amabile, DO    PAST MEDICAL HISTORY   Past Medical History:  Diagnosis Date   Asthma    as a child   Bronchitis 03/2016   pt has recovered from bronchitis   Chickenpox    Depression    Frequent headaches    GERD (gastroesophageal reflux disease)    when I was younger, better now.   Hypertension    Migraines    history of, not now   OSA (obstructive sleep apnea)      SURGICAL HISTORY   Past Surgical History:  Procedure Laterality Date   APPENDECTOMY  1977   HERNIA REPAIR     HYDROCELE EXCISION Right 06/01/2016   Procedure: HYDROCELECTOMY ADULT;  Surgeon: Morrie Sheldon  Apolinar Junes, MD;  Location: [facility] ORS;  Service: Urology;  Laterality: Right;   knee Left 1994   KNEE ARTHROSCOPY Right 1992   LAPAROTOMY N/A 07/13/2023   Procedure: LAPAROTOMY, EXPLORATORY;  Surgeon: Sung Amabile, DO;  Location: [facility] ORS;  Service: General;  Laterality: N/A;  Pink pad for case is needed possilble open   UMBILICAL HERNIA REPAIR  2013   VASECTOMY N/A 06/01/2016   Procedure: VASECTOMY;  Surgeon: Vanna Scotland, MD;  Location: [facility] ORS;  Service: Urology;  Laterality: N/A;     FAMILY HISTORY   Family History  Problem Relation Age of Onset   Ovarian cancer Mother    Alcohol abuse Father    Hyperlipidemia Father    Hypertension Father    Arthritis Maternal Grandmother    Colon cancer Maternal Grandmother    Ovarian cancer Maternal Grandmother    Breast cancer Maternal Grandmother    Hyperlipidemia Maternal Grandfather    Hypertension Maternal Grandfather    Hyperlipidemia Paternal Grandmother    Heart attack Paternal Grandmother    Hyperlipidemia Paternal Grandfather    Hypertension Paternal Grandfather    Prostate cancer Maternal Uncle    Bladder Cancer Neg Hx    Kidney disease Neg Hx      SOCIAL HISTORY   Social History   Tobacco Use   Smoking status: Every Day    Current packs/day: 0.50    Types: Cigarettes   Smokeless tobacco: Never    Tobacco comments:    March 11 2016  Vaping Use   Vaping status: Never Used  Substance Use Topics   Alcohol use: Yes    Alcohol/week: 4.0 - 6.0 standard drinks of alcohol    Types: 4 - 6 Shots of liquor per week   Drug use: No     MEDICATIONS   Current Medication:  Current Facility-Administered Medications:    .TPN (CLINIMIX-E) Adult, , Intravenous, Continuous TPN, Lowella Bandy, RPH, Last Rate: 42 mL/hr at 07/18/23 0600, Infusion Verify at 07/18/23 0600   0.9 %  sodium chloride infusion (Manually program via Guardrails IV Fluids), , Intravenous, Once, Henrene Dodge, MD, Held at 07/13/23 1900   0.9 %  sodium chloride infusion (Manually program via Guardrails IV Fluids), , Intravenous, Once, Piscoya, Jose, MD   0.9 %  sodium chloride infusion, , Intravenous, Continuous, Lowella Bandy, RPH, Last Rate: 60 mL/hr at 07/18/23 0600, Infusion Verify at 07/18/23 0600   Chlorhexidine Gluconate Cloth 2 % PADS 6 each, 6 each, Topical, Daily, Piscoya, Jose, MD, 6 each at 07/17/23 4742   diphenhydrAMINE (BENADRYL) injection 25 mg, 25 mg, Intravenous, Q6H PRN, Piscoya, Jose, MD   fentaNYL (SUBLIMAZE) injection 100 mcg, 100 mcg, Intravenous, Once, Piscoya, Jose, MD   fentaNYL (SUBLIMAZE) injection 100 mcg, 100 mcg, Intravenous, Once, Piscoya, Jose, MD   fentaNYL in NS (29mcg/ml) infusion-PREMIX, 0-400 mcg/hr, Intravenous, Continuous, Piscoya, Jose, MD, Last Rate: 20 mL/hr at 07/18/23 0809, 200 mcg/hr at 07/18/23 0809   HYDROmorphone (DILAUDID) injection 1 mg, 1 mg, Intravenous, Q2H PRN, Piscoya, Jose, MD, 1 mg at 07/18/23 0445   insulin aspart (novoLOG) injection 0-9 Units, 0-9 Units, Subcutaneous, Q6H, Lowella Bandy, RPH, 1 Units at 07/18/23 0641   ketorolac (TORADOL) 30 MG/ML injection 30 mg, 30 mg, Intravenous, Q6H PRN, Piscoya, Jose, MD   norepinephrine (LEVOPHED) 4mg  in (0.016 mg/mL) premix infusion, 0-40 mcg/min, Intravenous, Titrated, Piscoya, Jose, MD, Last Rate: 56.3  mL/hr at 07/18/23 0600, 15 mcg/min at 07/18/23 0600   ondansetron (ZOFRAN-ODT)  disintegrating tablet 4 mg, 4 mg, Oral, Q6H PRN **OR** ondansetron (ZOFRAN) injection 4 mg, 4 mg, Intravenous, Q6H PRN, Piscoya, Jose, MD, 4 mg at 07/16/23 1931   Oral care mouth rinse, 15 mL, Mouth Rinse, PRN, Piscoya, Jose, MD   pantoprazole (PROTONIX) injection 40 mg, 40 mg, Intravenous, Q24H, Piscoya, Jose, MD, 40 mg at 07/17/23 2153   piperacillin-tazobactam (ZOSYN) IVPB 3.375 g, 3.375 g, Intravenous, Q8H, Lowella Bandy, RPH, Last Rate: 12.5 mL/hr at 07/18/23 0600, Infusion Verify at 07/18/23 0600   propofol (DIPRIVAN) 1000 MG/100ML infusion, 5-80 mcg/kg/min, Intravenous, Titrated, Piscoya, Jose, MD, Last Rate: 41.4 mL/hr at 07/18/23 0659, 40 mcg/kg/min at 07/18/23 0659   sodium chloride flush (NS) 0.9 % injection 10-40 mL, 10-40 mL, Intracatheter, Q12H, Piscoya, Jose, MD, 10 mL at 07/17/23 2154   sodium chloride flush (NS) 0.9 % injection 10-40 mL, 10-40 mL, Intracatheter, PRN, Piscoya, Jose, MD    ALLERGIES   Sulfa antibiotics    REVIEW OF SYSTEMS     10 point ROS Unable to complete due to MV and sedation.  PHYSICAL EXAMINATION   Vital Signs: Temp:  [98 F (36.7 C)-100.1 F (37.8 C)] 98.5 F (36.9 C) (03/23 0000) Pulse Rate:  [72-115] 76 (03/23 0615) Resp:  [20-22] 22 (03/23 0615) BP: (67-121)/(34-67) 111/43 (03/23 0615) SpO2:  [90 %-100 %] 97 % (03/23 0743) FiO2 (%):  [40 %-70 %] 40 % (03/23 0743) Weight:  [175.5 kg] 175.5 kg (03/23 0500)  GENERAL:Mild distress and confusion  HEAD: Normocephalic, atraumatic.  EYES: Pupils equal, round, reactive to light.  No scleral icterus.  MOUTH: Moist mucosal membrane. NECK: Supple. No thyromegaly. No nodules. No JVD.  PULMONARY: wheezing and rhonchi bilaterally  CARDIOVASCULAR: S1 and S2. Regular rate and rhythm. No murmurs, rubs, or gallops.  GASTROINTESTINAL: Soft, post surgery- tender without peritoneal signs, distended. No masses. Positive  bowel sounds. No hepatosplenomegaly.  MUSCULOSKELETAL: No swelling, clubbing, or edema.  NEUROLOGIC:GCS4T SKIN:intact,warm,dry   PERTINENT DATA     Infusions:  .TPN (CLINIMIX-E) Adult 42 mL/hr at 07/18/23 0600   sodium chloride 60 mL/hr at 07/18/23 0600   fentaNYL infusion INTRAVENOUS 200 mcg/hr (07/18/23 0809)   norepinephrine (LEVOPHED) Adult infusion 15 mcg/min (07/18/23 0600)   piperacillin-tazobactam (ZOSYN)  IV 12.5 mL/hr at 07/18/23 0600   propofol (DIPRIVAN) infusion 40 mcg/kg/min (07/18/23 0659)   Scheduled Medications:  sodium chloride   Intravenous Once   sodium chloride   Intravenous Once   Chlorhexidine Gluconate Cloth  6 each Topical Daily   fentaNYL (SUBLIMAZE) injection  100 mcg Intravenous Once   fentaNYL (SUBLIMAZE) injection  100 mcg Intravenous Once   insulin aspart  0-9 Units Subcutaneous Q6H   pantoprazole (PROTONIX) IV  40 mg Intravenous Q24H   sodium chloride flush  10-40 mL Intracatheter Q12H   PRN Medications: diphenhydrAMINE, HYDROmorphone (DILAUDID) injection, ketorolac, ondansetron **OR** ondansetron (ZOFRAN) IV, mouth rinse, sodium chloride flush Hemodynamic parameters:   Intake/Output: 03/22 0701 - 03/23 0700 In: 3912.5 [I.V.:3231.3; NG/GT:180; IV Piggyback:501.2] Out: 2085 [Urine:1225; Emesis/NG output:300; Drains:360]  Ventilator  Settings: Vent Mode: PRVC FiO2 (%):  [40 %-70 %] 40 % Set Rate:  [22 bmp] 22 bmp Vt Set:  [550 mL] 550 mL PEEP:  [5 cmH20] 5 cmH20 Plateau Pressure:  [13 cmH20-29 cmH20] 29 cmH20   LAB RESULTS:  Basic Metabolic Panel: Recent Labs  Lab 07/14/23 0358 07/15/23 0610 07/16/23 0526 07/17/23 0242 07/18/23 0553  NA 135 135 138 134* 133*  K 4.5 4.7 4.4 3.9 4.0  CL 108  106 105 101 102  CO2 21* 24 26 24 23   GLUCOSE 149* 142* 105* 117* 139*  BUN 23* 30* 23* 24* 42*  CREATININE 1.29* 1.13 0.89 0.94 1.69*  CALCIUM 8.5* 8.6* 9.1 9.4 7.7*  MG 1.6* 2.3 2.1 2.1  --   PHOS 3.1 3.4 3.7  --  4.7*   Liver Function  Tests: Recent Labs  Lab 07/13/23 1533 07/14/23 0358 07/15/23 0610 07/16/23 0526 07/17/23 0242 07/18/23 0553  AST 33  --   --   --  30  --   ALT 17  --   --   --  19  --   ALKPHOS 79  --   --   --  64  --   BILITOT 2.0*  --   --   --  2.1*  --   PROT 5.9*  --   --   --  6.6  --   ALBUMIN 2.7* 2.9* 3.0* 3.1* 3.2* 2.4*   No results for input(s): "LIPASE", "AMYLASE" in the last 168 hours. No results for input(s): "AMMONIA" in the last 168 hours. CBC: Recent Labs  Lab 07/13/23 1651 07/13/23 2204 07/14/23 0358 07/15/23 0610 07/16/23 0526 07/17/23 0242 07/18/23 0554  WBC 15.5*  --  10.5 15.2* 12.8* 6.7 17.2*  NEUTROABS 13.9*  --   --   --   --  5.0  --   HGB 14.5   < > 13.0 13.1 13.2 14.2 13.0  HCT 41.4   < > 38.1* 37.8* 38.7* 41.4 38.1*  MCV 101.5*  --  102.7* 102.2* 101.0* 102.0* 103.8*  PLT 147*  --  116* 116* 119* 107* 128*   < > = values in this interval not displayed.   Cardiac Enzymes: No results for input(s): "CKTOTAL", "CKMB", "CKMBINDEX", "TROPONINI" in the last 168 hours. BNP: Invalid input(s): "POCBNP" CBG: Recent Labs  Lab 07/13/23 1527 07/17/23 1948 07/18/23 0004 07/18/23 0545  GLUCAP 157* 124* 120* 134*       IMAGING RESULTS:      ASSESSMENT AND PLAN    -Multidisciplinary rounds held today  Acute respiratory distress with hypoxemia-RESOLVED -s/p TRALI with post FFP/platelet infusion - resolved post full scope of therapy -now coming in with reps distress due to abd wound fascial dehisence and evisiration -continue Nasal canula with spO2>90%    Alcoholism history   - previous admission for ETOH withdrawal-states he has not drank in many months   - CIWA protocol    - thiamine and folate      Post inguinal hernia fascial dehisence   - surgery on case appreciate input - JP drain slowing down, h/h stable     - s/p revision closure of abdominal wound    - continue zosyn    -continue NG to suction     - continue abdminal  binder  ID -continue IV abx as prescibed -follow up cultures  GI/Nutrition GI PROPHYLAXIS as indicated DIET-->TF's as tolerated Constipation protocol as indicated  ENDO - ICU hypoglycemic\Hyperglycemia protocol -check FSBS per protocol   ELECTROLYTES -follow labs as needed -replace as needed -pharmacy consultation   DVT/GI PRX ordered -SCDs  TRANSFUSIONS AS NEEDED MONITOR FSBS ASSESS the need for LABS as needed    Critical care provider statement:   Total critical care time: 32  minutes   Performed by: Karna Christmas MD   Critical care time was exclusive of separately billable procedures and treating other patients.   Critical care was necessary to treat or prevent imminent or  life-threatening deterioration.   Critical care was time spent personally by me on the following activities: development of treatment plan with patient and/or surrogate as well as nursing, discussions with consultants, evaluation of patient's response to treatment, examination of patient, obtaining history from patient or surrogate, ordering and performing treatments and interventions, ordering and review of laboratory studies, ordering and review of radiographic studies, pulse oximetry and re-evaluation of patient's condition.    Vida Rigger, M.D.  Pulmonary & Critical Care Medicine

## 2023-07-18 NOTE — Anesthesia Postprocedure Evaluation (Signed)
 Anesthesia Post Note  Patient: [name]  Procedure(s) Performed: LAPAROTOMY, EXPLORATORY, ABDOMINAL WASHOUT WITH CLOSURE  Patient location during evaluation: SICU Anesthesia Type: General Level of consciousness: sedated Pain management: pain level controlled Vital Signs Assessment: vitals unstable Respiratory status: patient remains intubated per anesthesia plan and patient on ventilator - see flowsheet for VS Cardiovascular status: unstable Postop Assessment: no apparent nausea or vomiting Anesthetic complications: no Comments: Pt on a NE gtt, prop gtt, fent gtt with slighly low BP and elevated Creatinine. Respiratory function appears stable. Pt is critical condition but no anesthesia complications found.   No notable events documented.   Last Vitals:  Vitals:   07/18/23 0600 07/18/23 0615  BP: (!) 111/47 (!) 111/43  Pulse: 75 76  Resp: (!) 22 (!) 22  Temp:    SpO2: 98% 98%    Last Pain:  Vitals:   07/18/23 0000  TempSrc: Oral  PainSc:                  Foye Deer

## 2023-07-18 NOTE — Procedures (Signed)
 Extubation Procedure Note  Patient Details:   Name: [name] DOB: [birth year] MRN: [id number]   Airway Documentation:    Vent end date: 07/18/23 Vent end time: 0955   Evaluation  O2 sats: stable throughout Complications: No apparent complications Patient did tolerate procedure well. Bilateral Breath Sounds: Diminished, Clear   Yes able to cough and speak.  Oral suction for large amount of brown/red, thick secretions post extubation.  Placed on 4L [place].  Ronda Fairly Mackson Botz 07/18/2023, 9:59 AM

## 2023-07-18 NOTE — Progress Notes (Signed)
 07/18/2023  Subjective: Patient is 1 Day Post-Op.  No acute events overnight.  ICU team is starting to wake the patient up to try SBT and possible extubation.  WBC is elevated to 17.2 likely reactive from the surgery yesterday was his aspiration pneumonia.  He has required some increased norepinephrine due to increased sedation needs.  NG output recorded at 300 mL and JP output at 360 mL.  Vital signs: Temp:  [98.5 F (36.9 C)-101 F (38.3 C)] 100.1 F (37.8 C) (03/23 1600) Pulse Rate:  [72-123] 117 (03/23 1500) Resp:  [10-22] 10 (03/23 1500) BP: (67-132)/(34-75) 130/61 (03/23 1600) SpO2:  [94 %-100 %] 97 % (03/23 1500) FiO2 (%):  [35 %-50 %] 35 % (03/23 0950) Weight:  [175.5 kg] 175.5 kg (03/23 0500)   Intake/Output: 03/22 0701 - 03/23 0700 In: 3912.5 [I.V.:3231.3; NG/GT:180; IV Piggyback:501.2] Out: 2085 [Urine:1225; Emesis/NG output:300; Drains:360] Last BM Date : 07/13/23  Physical Exam: Constitutional: Sedated but awakening to commands Abdomen: Soft, nondistended, appropriately tender to palpation.  Midline incision is clean, dry, intact with staples and retention sutures.  Blake drain with serosanguineous fluid.  Labs:  Recent Labs    07/17/23 0242 07/18/23 0554  WBC 6.7 17.2*  HGB 14.2 13.0  HCT 41.4 38.1*  PLT 107* 128*   Recent Labs    07/17/23 0242 07/18/23 0553  NA 134* 133*  K 3.9 4.0  CL 101 102  CO2 24 23  GLUCOSE 117* 139*  BUN 24* 42*  CREATININE 0.94 1.69*  CALCIUM 9.4 7.7*   Recent Labs    07/17/23 0242  LABPROT 16.5*  INR 1.3*    Imaging: No results found.  Assessment/Plan: This is a 57 y.o. male s/p exploratory laparotomy with abdominal closure for fascial dehiscence and evisceration.  - From surgical standpoint, currently we do not foresee any further surgery is needed.  It would be okay to continue awakening the patient and extubate today. - Nonetheless, we will keep the patient's NG tube and continue NG tube to suction until return  of bowel function.  Will be expecting postoperative ileus given his redo surgery and fluoroscopy manipulation of the small bowel to milk the contents up to the NG tube yesterday. - Continue TPN for nutrition until bowel function return and able to tolerate p.o. - Continue IV antibiotics for his aspiration. - Will be appropriate to place abdominal binder to help with any potential tension of the abdominal wall.   Devin Ill, MD [facility]

## 2023-07-18 NOTE — Progress Notes (Addendum)
 PHARMACY - TOTAL PARENTERAL NUTRITION CONSULT NOTE   Indication: Prolonged ileus  Patient Measurements: Height: 6\' 2"  (188 cm) Weight: (!) 175.5 kg (386 lb 14.5 oz) IBW/kg (Calculated) : 82.2 TPN AdjBW (KG): 101.3 Body mass index is 49.68 kg/m.  Assessment:  58 y.o. male w/ PMH of morbid obesity, mild intermittent asthma, right inguinal hernia, GERD, OSA, migraines admitted on 07/13/2023 with a ventral hernia.   Glucose / Insulin: BG 120 - 139 / 1 unit SSI required Electrolytes: slight hyponatremia Renal: SCr 0.94 > 1.69 Hepatic: bilirubin elevated, stable / INR 1.3 --LFTs wnl --PLT low at baseine, stable Intake / Output; MIVF: 0.9 %  sodium chloride at 60 mL/hr GI Imaging: 03/22 abd Xray Dilatation of small bowel in the mid to lower abdomen, Mild to moderate fecal stasis   GI Surgeries / Procedures:  03/22 Exploratory Laparotomy, abdominal washout, abdominal closure   Central access: 07/16/23 TPN start date: 07/17/23  Current Nutrition:  NPO  Plan:  ---advance E8/10 TPN to 84 mL/hr at 1800 + SMOF lipids 250 mL at 21 mL/hr until completed ---Electrolytes in TPN (standard): Na 40mEq/L, K 10mEq/L, Ca 4.83mEq/L, Mg 74mEq/L, and Phos 57mmol/L. Cl:Ac 0.49 ---Add standard MVI, thiamine 100mg , folic acid 1mg  and trace elements to TPN ---continue Sensitive q6h SSI and adjust as needed  ---continue MIVF at 60 mL/hr ISO AKI ---Monitor TPN labs on Mon/Thurs, daily until stable  Lowella Bandy 07/18/2023,7:46 AM

## 2023-07-18 NOTE — Plan of Care (Signed)

## 2023-07-19 ENCOUNTER — Encounter: Payer: Self-pay | Admitting: Surgery

## 2023-07-19 DIAGNOSIS — R6521 Severe sepsis with septic shock: Secondary | ICD-10-CM

## 2023-07-19 DIAGNOSIS — J9601 Acute respiratory failure with hypoxia: Secondary | ICD-10-CM

## 2023-07-19 DIAGNOSIS — J9602 Acute respiratory failure with hypercapnia: Secondary | ICD-10-CM | POA: Diagnosis not present

## 2023-07-19 DIAGNOSIS — K439 Ventral hernia without obstruction or gangrene: Secondary | ICD-10-CM | POA: Diagnosis not present

## 2023-07-19 DIAGNOSIS — A419 Sepsis, unspecified organism: Secondary | ICD-10-CM

## 2023-07-19 LAB — CBC
HCT: 37.8 % — ABNORMAL LOW (ref 39.0–52.0)
Hemoglobin: 13.1 g/dL (ref 13.0–17.0)
MCH: 35.6 pg — ABNORMAL HIGH (ref 26.0–34.0)
MCHC: 34.7 g/dL (ref 30.0–36.0)
MCV: 102.7 fL — ABNORMAL HIGH (ref 80.0–100.0)
Platelets: 111 10*3/uL — ABNORMAL LOW (ref 150–400)
RBC: 3.68 MIL/uL — ABNORMAL LOW (ref 4.22–5.81)
RDW: 14 % (ref 11.5–15.5)
WBC: 22.1 10*3/uL — ABNORMAL HIGH (ref 4.0–10.5)
nRBC: 0.1 % (ref 0.0–0.2)

## 2023-07-19 LAB — BLOOD GAS, VENOUS
Acid-base deficit: 2.6 mmol/L — ABNORMAL HIGH (ref 0.0–2.0)
Bicarbonate: 23.2 mmol/L (ref 20.0–28.0)
O2 Saturation: 89.5 %
Patient temperature: 37
pCO2, Ven: 43 mmHg — ABNORMAL LOW (ref 44–60)
pH, Ven: 7.34 (ref 7.25–7.43)
pO2, Ven: 56 mmHg — ABNORMAL HIGH (ref 32–45)

## 2023-07-19 LAB — COMPREHENSIVE METABOLIC PANEL
ALT: 20 U/L (ref 0–44)
AST: 39 U/L (ref 15–41)
Albumin: 2.4 g/dL — ABNORMAL LOW (ref 3.5–5.0)
Alkaline Phosphatase: 54 U/L (ref 38–126)
Anion gap: 12 (ref 5–15)
BUN: 35 mg/dL — ABNORMAL HIGH (ref 6–20)
CO2: 23 mmol/L (ref 22–32)
Calcium: 8.3 mg/dL — ABNORMAL LOW (ref 8.9–10.3)
Chloride: 102 mmol/L (ref 98–111)
Creatinine, Ser: 1.21 mg/dL (ref 0.61–1.24)
GFR, Estimated: >60 mL/min (ref >60)
Glucose, Bld: 153 mg/dL — ABNORMAL HIGH (ref 70–99)
Potassium: 4.5 mmol/L (ref 3.5–5.1)
Sodium: 137 mmol/L (ref 135–145)
Total Bilirubin: 2 mg/dL — ABNORMAL HIGH (ref 0.0–1.2)
Total Protein: 5.6 g/dL — ABNORMAL LOW (ref 6.5–8.1)

## 2023-07-19 LAB — GLUCOSE, CAPILLARY
Glucose-Capillary: 112 mg/dL — ABNORMAL HIGH (ref 70–99)
Glucose-Capillary: 142 mg/dL — ABNORMAL HIGH (ref 70–99)
Glucose-Capillary: 146 mg/dL — ABNORMAL HIGH (ref 70–99)
Glucose-Capillary: 143 mg/dL — ABNORMAL HIGH (ref 70–99)
Glucose-Capillary: 131 mg/dL — ABNORMAL HIGH (ref 70–99)

## 2023-07-19 LAB — LACTIC ACID, PLASMA
Lactic Acid, Venous: 1.9 mmol/L (ref 0.5–1.9)
Lactic Acid, Venous: 1.9 mmol/L (ref 0.5–1.9)
Lactic Acid, Venous: 3.4 mmol/L (ref 0.5–1.9)
Lactic Acid, Venous: 1.5 mmol/L (ref 0.5–1.9)

## 2023-07-19 LAB — PHOSPHORUS: Phosphorus: 2.9 mg/dL (ref 2.5–4.6)

## 2023-07-19 LAB — MAGNESIUM: Magnesium: 2.2 mg/dL (ref 1.7–2.4)

## 2023-07-19 LAB — TRIGLYCERIDES: Triglycerides: 87 mg/dL (ref <150)

## 2023-07-19 MED ORDER — ACETAMINOPHEN 10 MG/ML IV SOLN
1000.0000 mg | Freq: Once | INTRAVENOUS | Status: AC
  Administered 2023-07-19: 1000 mg via INTRAVENOUS
  Filled 2023-07-19: qty 100

## 2023-07-19 MED ORDER — LACTATED RINGERS IV BOLUS
1000.0000 mL | Freq: Once | INTRAVENOUS | Status: AC
  Administered 2023-07-19: 1000 mL via INTRAVENOUS

## 2023-07-19 MED ORDER — FAT EMUL FISH OIL/PLANT BASED 20% (SMOFLIPID)IV EMUL
250.0000 mL | INTRAVENOUS | Status: AC
  Administered 2023-07-19: 250 mL via INTRAVENOUS
  Filled 2023-07-19: qty 250

## 2023-07-19 MED ORDER — TRACE MINERALS CU-MN-SE-ZN 300-55-60-3000 MCG/ML IV SOLN
INTRAVENOUS | Status: AC
  Filled 2023-07-19: qty 2000

## 2023-07-19 NOTE — Plan of Care (Signed)

## 2023-07-19 NOTE — TOC Progression Note (Signed)
 Transition of Care [facility]) - Progression Note    Patient Details  Name: [name] MRN: [id number] Date of Birth: [birth year]  Transition of Care [facility]) CM/SW Contact  Garret Reddish, RN Phone Number: 07/19/2023, 11:47 AM  Clinical Narrative:    Chart reviewed.  Note that patient had ventral hernia repair on 07-13-23.  Patient had post inguinal hernia fascial dehiscence developed and acute respiratory distress with hypoxemia with septic/hypovolumic shock. Patient remains on IV Zosyn.  Patient currently on 02 at 4l per .  Patient remains on Levophed for pressor support.   TOC will continue to discharge planning.          Expected Discharge Plan and Services                                               Social Determinants of Health (SDOH) Interventions SDOH Screenings   Food Insecurity: No Food Insecurity (07/14/2023)  Housing: Low Risk  (07/14/2023)  Transportation Needs: No Transportation Needs (07/14/2023)  Utilities: Not At Risk (07/14/2023)  Tobacco Use: High Risk (07/13/2023)    Readmission Risk Interventions     No data to display

## 2023-07-19 NOTE — Progress Notes (Signed)
 PHARMACY - TOTAL PARENTERAL NUTRITION CONSULT NOTE   Indication: Prolonged ileus  Patient Measurements: Height: 6\' 2"  (188 cm) Weight: (!) 174.3 kg (384 lb 4.2 oz) IBW/kg (Calculated) : 82.2 TPN AdjBW (KG): 101.3 Body mass index is 49.34 kg/m.  Assessment:  58 y.o. male w/ PMH of morbid obesity, mild intermittent asthma, right inguinal hernia, GERD, OSA, migraines admitted on 07/13/2023 with a ventral hernia.   Glucose / Insulin: BG 124 - 153 / 4 unit SSI required Electrolytes: slight hyponatremia Renal: SCr 0.94>1.69>1.21 Hepatic: bilirubin elevated, stable / INR 1.3 --LFTs wnl --PLT low at baseine, stable Intake / Output; MIVF: net -2.9L GI Imaging: 03/22 abd Xray Dilatation of small bowel in the mid to lower abdomen, Mild to moderate fecal stasis   GI Surgeries / Procedures:  03/22 Exploratory Laparotomy, abdominal washout, abdominal closure   Central access: 07/16/23 TPN start date: 07/17/23  Current Nutrition:  NPO  Plan:  ---Continue E8/10 TPN to 84 mL/hr at 1800 + SMOF lipids 250 mL at 21 mL/hr until completed ---Electrolytes in TPN (standard): Na 55mEq/L, K 4mEq/L, Ca 4.8mEq/L, Mg 71mEq/L, and Phos 42mmol/L. Cl:Ac 0.49 ---Add standard MVI, thiamine 100mg , folic acid 1mg  and trace elements to TPN ---continue Sensitive q6h SSI and adjust as needed  ---Monitor TPN labs on Mon/Thurs, daily until stable  [name] 07/19/2023,12:04 PM

## 2023-07-19 NOTE — Progress Notes (Signed)
 [facility] Pulmonary Medicine Consultation    Name: [name] MRN: [id number] DOB: 1966-02-18     LOS: 6  CC Follow up septic shock Wound dehiscence  HPI 58 yo M with hx of obesity , OSA, , EtOH abuse hx of admission to hospital for ETOH withdrawal.  He came in for elective surgery to have inguinal hernia repair with lysis of adhesions.  He had significant bleeding per surgeon which was unusual and required electrocautery and ligature for hemostasis.  He was brought back with abdominal JP drain and had FFP and platelets ordered due to intraoperative bleeding.  After receiving these blood products patient deteriorated and developed circulatory shock, hypoxemia, swelling worse on lower extremities.  He had code RRT called and was emergently brought to ICU due to shock with hypoxemia. He was treated for anaphylactic non febrile blood product reaction vs TRALI and improved overnight.  He became stable and was downgraded to medical floor.    Ive discussed case with Blood bank - [facility] -Interim charge and Jimmy Picket -Director of blood bank, we discussed patients reaction and there will be additional workup for this anaphylactic reaction vs Transfusion related acute lung injury.      HOSPITAL COURSE 07/17/23- patient had code RRT overnight, he had findings of small bowel evisceration secondary to fascial dehiscence.  This likely stemmed from forceful cough per surgery service.  He became unstable in distress and required endotracheal intubation.  He was taken to OR emergently for ex-lap and closure of abdominal cavity.  Daughter was available to come in and we reviewed medical plan.   07/18/23- extubated but reports severe pain. Daughter at bedside and patient is improved able to speak in full sentences.  Reviewed case with surgery and placed abdominal binder.  He is on CIWA due to reported daily drinking.   3/24 lethargic but arousable, remains  on pressors     EVENTS  OVERNIGHT Remains critically ill Remains on pressors 4L [place]  Lines/tubes : Closed System Drain Left LUQ Bulb (JP) 19 Fr. (Active)  Dressing Status Clean, Dry, Intact 07/13/23 1458  Output (mL) 40 mL 07/13/23 1458     Urethral Catheter morgan Double-lumen 16 Fr. (Active)  Site Assessment Clean, Dry, Intact 07/13/23 1458  Output (mL) 175 mL 07/13/23 1458    Microbiology/Sepsis markers: Results for orders placed or performed during the hospital encounter of 07/13/23  Urine Culture     Status: None   Collection Time: 07/13/23  4:51 PM   Specimen: Urine, Random  Result Value Ref Range Status   Specimen Description   Final    URINE, RANDOM Performed at [facility], [address], [place], [place] [address]    Special Requests   Final    NONE Reflexed from [id number] Performed at [facility], [address] [place], [place] [address]    Culture   Final    NO GROWTH Performed at [facility], [address] [place], [place] [address]    Report Status 07/14/2023 FINAL  Final  Culture, blood (Routine X 2) w Reflex to ID Panel     Status: None   Collection Time: 07/13/23  5:59 PM   Specimen: Right Antecubital; Blood  Result Value Ref Range Status   Specimen Description RIGHT ANTECUBITAL  Final   Special Requests   Final    BOTTLES DRAWN AEROBIC AND ANAEROBIC Blood Culture adequate volume   Culture   Final    NO GROWTH 5  DAYS Performed at [facility], [address] [place], [place] [address]    Report Status 07/18/2023 FINAL  Final  Culture, blood (Routine X 2) w Reflex to ID Panel     Status: None   Collection Time: 07/13/23  6:20 PM   Specimen: BLOOD RIGHT HAND  Result Value Ref Range Status   Specimen Description BLOOD RIGHT HAND RT THUMB  Final   Special Requests   Final    BOTTLES DRAWN AEROBIC ONLY Blood Culture results may not be optimal due to an inadequate volume of blood received in culture bottles   Culture   Final    NO  GROWTH 5 DAYS Performed at [facility], [address] [place], [place] [address]    Report Status 07/18/2023 FINAL  Final  MRSA Next Gen by PCR, Nasal     Status: None   Collection Time: 07/14/23  2:06 AM   Specimen: Nasal Mucosa; Nasal Swab  Result Value Ref Range Status   MRSA by PCR Next Gen NOT DETECTED NOT DETECTED Final    Comment: (NOTE) The GeneXpert MRSA Assay (FDA approved for NASAL specimens only), is one component of a comprehensive MRSA colonization surveillance program. It is not intended to diagnose MRSA infection nor to guide or monitor treatment for MRSA infections. Test performance is not FDA approved in patients less than 95 years old. Performed at [facility], [address], [place], [place] [address]     Anti-infectives:  Anti-infectives (From admission, onward)    Start     Dose/Rate Route Frequency Ordered Stop   07/17/23 1400  piperacillin-tazobactam (ZOSYN) IVPB 3.375 g        3.375 g 12.5 mL/hr over 240 Minutes Intravenous Every 8 hours 07/17/23 0753     07/17/23 0600  piperacillin-tazobactam (ZOSYN) IVPB 3.375 g        3.375 g 100 mL/hr over 30 Minutes Intravenous On call to O.R. 07/17/23 0349 07/17/23 0503   07/15/23 1045  amoxicillin-clavulanate (AUGMENTIN) 875-125 MG per tablet 1 tablet  Status:  Discontinued        1 tablet Oral Every 12 hours 07/15/23 0955 07/17/23 0753   07/13/23 1830  piperacillin-tazobactam (ZOSYN) IVPB 3.375 g  Status:  Discontinued        3.375 g 12.5 mL/hr over 240 Minutes Intravenous Every 8 hours 07/13/23 1743 07/15/23 0955   07/13/23 1815  cefTRIAXone (ROCEPHIN) 1 g in sodium chloride 0.9 % 100 mL IVPB  Status:  Discontinued        1 g 200 mL/hr over 30 Minutes Intravenous Every 24 hours 07/13/23 1728 07/13/23 1729   07/13/23 0715  ceFAZolin (ANCEF) IVPB 3g/150 mL premix        3 g 300 mL/hr over 30 Minutes Intravenous On call to O.R. 07/13/23 0700 07/13/23 9147        Consults: Treatment  Team:  Sung Amabile, DO    PAST MEDICAL HISTORY   Past Medical History:  Diagnosis Date   Asthma    as a child   Bronchitis 03/2016   pt has recovered from bronchitis   Chickenpox    Depression    Frequent headaches    GERD (gastroesophageal reflux disease)    when I was younger, better now.   Hypertension    Migraines    history of, not now   OSA (obstructive sleep apnea)      SURGICAL HISTORY   Past Surgical History:  Procedure Laterality Date  APPENDECTOMY  1977   HERNIA REPAIR     HYDROCELE EXCISION Right 06/01/2016   Procedure: HYDROCELECTOMY ADULT;  Surgeon: Vanna Scotland, MD;  Location: [facility] ORS;  Service: Urology;  Laterality: Right;   knee Left 1994   KNEE ARTHROSCOPY Right 1992   LAPAROTOMY N/A 07/13/2023   Procedure: LAPAROTOMY, EXPLORATORY;  Surgeon: Sung Amabile, DO;  Location: [facility] ORS;  Service: General;  Laterality: N/A;  Pink pad for case is needed possilble open   UMBILICAL HERNIA REPAIR  2013   VASECTOMY N/A 06/01/2016   Procedure: VASECTOMY;  Surgeon: Vanna Scotland, MD;  Location: [facility] ORS;  Service: Urology;  Laterality: N/A;     FAMILY HISTORY   Family History  Problem Relation Age of Onset   Ovarian cancer Mother    Alcohol abuse Father    Hyperlipidemia Father    Hypertension Father    Arthritis Maternal Grandmother    Colon cancer Maternal Grandmother    Ovarian cancer Maternal Grandmother    Breast cancer Maternal Grandmother    Hyperlipidemia Maternal Grandfather    Hypertension Maternal Grandfather    Hyperlipidemia Paternal Grandmother    Heart attack Paternal Grandmother    Hyperlipidemia Paternal Grandfather    Hypertension Paternal Grandfather    Prostate cancer Maternal Uncle    Bladder Cancer Neg Hx    Kidney disease Neg Hx      SOCIAL HISTORY   Social History   Tobacco Use   Smoking status: Every Day    Current packs/day: 0.50    Types: Cigarettes   Smokeless tobacco: Never   Tobacco comments:    March 11 2016  Vaping Use   Vaping status: Never Used  Substance Use Topics   Alcohol use: Yes    Alcohol/week: 4.0 - 6.0 standard drinks of alcohol    Types: 4 - 6 Shots of liquor per week   Drug use: No     MEDICATIONS   Current Medication:  Current Facility-Administered Medications:    .TPN (CLINIMIX-E) Adult, , Intravenous, Continuous TPN, Lowella Bandy, RPH, Last Rate: 84 mL/hr at 07/19/23 0600, Infusion Verify at 07/19/23 0600   0.9 %  sodium chloride infusion (Manually program via Guardrails IV Fluids), , Intravenous, Once, Henrene Dodge, MD, Held at 07/13/23 1900   0.9 %  sodium chloride infusion (Manually program via Guardrails IV Fluids), , Intravenous, Once, Piscoya, Jose, MD   acetaminophen (OFIRMEV) IV 1,000 mg, 1,000 mg, Intravenous, Q8H PRN, Vida Rigger, MD, Stopped at 07/19/23 0202   Chlorhexidine Gluconate Cloth 2 % PADS 6 each, 6 each, Topical, Daily, Piscoya, Jose, MD, 6 each at 07/18/23 6045   chlorpheniramine-HYDROcodone (TUSSIONEX) 10-8 MG/5ML suspension 10 mL, 10 mL, Per Tube, TID, Vida Rigger, MD, 10 mL at 07/18/23 2207   diphenhydrAMINE (BENADRYL) injection 25 mg, 25 mg, Intravenous, Q6H PRN, Piscoya, Jose, MD   folic acid (FOLVITE) tablet 1 mg, 1 mg, Oral, Daily, Karna Christmas, Fuad, MD   HYDROmorphone (DILAUDID) injection 1 mg, 1 mg, Intravenous, Q2H PRN, Piscoya, Jose, MD, 1 mg at 07/18/23 1347   insulin aspart (novoLOG) injection 0-9 Units, 0-9 Units, Subcutaneous, Q6H, Lowella Bandy, RPH, 1 Units at 07/19/23 0548   LORazepam (ATIVAN) tablet 1-4 mg, 1-4 mg, Oral, Q1H PRN **OR** LORazepam (ATIVAN) injection 1-4 mg, 1-4 mg, Intravenous, Q1H PRN, Vida Rigger, MD   multivitamin with minerals tablet 1 tablet, 1 tablet, Oral, Daily, Aleskerov, Fuad, MD   norepinephrine (LEVOPHED) 16 mg in (0.064 mg/mL) premix infusion, 0-40 mcg/min, Intravenous,  Titrated, Jimmye Norman, NP, Last Rate: 20.6 mL/hr at 07/19/23 0814, 22 mcg/min at 07/19/23 0814    ondansetron (ZOFRAN-ODT) disintegrating tablet 4 mg, 4 mg, Oral, Q6H PRN **OR** ondansetron (ZOFRAN) injection 4 mg, 4 mg, Intravenous, Q6H PRN, Piscoya, Jose, MD, 4 mg at 07/16/23 1931   Oral care mouth rinse, 15 mL, Mouth Rinse, PRN, Piscoya, Jose, MD   pantoprazole (PROTONIX) injection 40 mg, 40 mg, Intravenous, Q24H, Piscoya, Jose, MD, 40 mg at 07/18/23 2207   piperacillin-tazobactam (ZOSYN) IVPB 3.375 g, 3.375 g, Intravenous, Q8H, Lowella Bandy, RPH, Last Rate: 12.5 mL/hr at 07/19/23 0600, Infusion Verify at 07/19/23 0600   sodium chloride flush (NS) 0.9 % injection 10-40 mL, 10-40 mL, Intracatheter, Q12H, Piscoya, Jose, MD, 10 mL at 07/19/23 1002   sodium chloride flush (NS) 0.9 % injection 10-40 mL, 10-40 mL, Intracatheter, PRN, Piscoya, Jose, MD   thiamine (VITAMIN B1) tablet 100 mg, 100 mg, Oral, Daily **OR** thiamine (VITAMIN B1) injection 100 mg, 100 mg, Intravenous, Daily, Karna Christmas, Fuad, MD    ALLERGIES   Sulfa antibiotics    REVIEW OF SYSTEMS   ROS limited due to lethargy  PHYSICAL EXAMINATION   Vital Signs: Temp:  [98.5 F (36.9 C)-101.9 F (38.8 C)] 98.5 F (36.9 C) (03/24 0800) Pulse Rate:  [97-128] 105 (03/24 0945) Resp:  [10-20] 14 (03/24 0945) BP: (98-165)/(31-141) 107/49 (03/24 0945) SpO2:  [96 %-98 %] 97 % (03/24 0945) Weight:  [174.3 kg] 174.3 kg (03/24 0500)  GENERAL:mild confusion  HEAD: Normocephalic, atraumatic.  EYES: Pupils equal, round, reactive to light.  No scleral icterus.  MOUTH: Moist mucosal membrane. NECK: Supple. No thyromegaly. No nodules. No JVD.  PULMONARY: wheezing and rhonchi bilaterally  CARDIOVASCULAR: S1 and S2. Regular rate and rhythm. No murmurs, rubs, or gallops.  GASTROINTESTINAL: Soft, post surgery-tender without peritoneal signs, distended. No masses. Midline incision is clean, dry, intact with staples and retention sutures. Blake drain with serosanguineous fluid.  MUSCULOSKELETAL: edema.   SKIN:intact,warm,dry   PERTINENT DATA     Infusions:  .TPN (CLINIMIX-E) Adult 84 mL/hr at 07/19/23 0600   acetaminophen Stopped (07/19/23 0202)   norepinephrine (LEVOPHED) Adult infusion 22 mcg/min (07/19/23 0814)   piperacillin-tazobactam (ZOSYN)  IV 12.5 mL/hr at 07/19/23 0600   Scheduled Medications:  sodium chloride   Intravenous Once   sodium chloride   Intravenous Once   Chlorhexidine Gluconate Cloth  6 each Topical Daily   chlorpheniramine-HYDROcodone  10 mL Per Tube TID   folic acid  1 mg Oral Daily   insulin aspart  0-9 Units Subcutaneous Q6H   multivitamin with minerals  1 tablet Oral Daily   pantoprazole (PROTONIX) IV  40 mg Intravenous Q24H   sodium chloride flush  10-40 mL Intracatheter Q12H   thiamine  100 mg Oral Daily   Or   thiamine  100 mg Intravenous Daily   PRN Medications: acetaminophen, diphenhydrAMINE, HYDROmorphone (DILAUDID) injection, LORazepam **OR** LORazepam, ondansetron **OR** ondansetron (ZOFRAN) IV, mouth rinse, sodium chloride flush Hemodynamic parameters:   Intake/Output: 03/23 0701 - 03/24 0700 In: 4754.1 [I.V.:4223.2; NG/GT:150; IV Piggyback:350.9] Out: 3848 [Urine:3125; Emesis/NG output:550; Drains:73]  Ventilator  Settings:     LAB RESULTS:  Basic Metabolic Panel: Recent Labs  Lab 07/14/23 0358 07/15/23 0610 07/16/23 0526 07/17/23 0242 07/18/23 0553 07/18/23 0554 07/19/23 0525  NA 135 135 138 134* 133*  --  137  K 4.5 4.7 4.4 3.9 4.0  --  4.5  CL 108 106 105 101 102  --  102  CO2 21* 24 26 24 23   --  23  GLUCOSE 149* 142* 105* 117* 139*  --  153*  BUN 23* 30* 23* 24* 42*  --  35*  CREATININE 1.29* 1.13 0.89 0.94 1.69*  --  1.21  CALCIUM 8.5* 8.6* 9.1 9.4 7.7*  --  8.3*  MG 1.6* 2.3 2.1 2.1  --  1.9 2.2  PHOS 3.1 3.4 3.7  --  4.7*  --  2.9   Liver Function Tests: Recent Labs  Lab 07/13/23 1533 07/14/23 0358 07/15/23 0610 07/16/23 0526 07/17/23 0242 07/18/23 0553 07/19/23 0525  AST 33  --   --   --  30  --   39  ALT 17  --   --   --  19  --  20  ALKPHOS 79  --   --   --  64  --  54  BILITOT 2.0*  --   --   --  2.1*  --  2.0*  PROT 5.9*  --   --   --  6.6  --  5.6*  ALBUMIN 2.7*   < > 3.0* 3.1* 3.2* 2.4* 2.4*   < > = values in this interval not displayed.   No results for input(s): "LIPASE", "AMYLASE" in the last 168 hours. No results for input(s): "AMMONIA" in the last 168 hours. CBC: Recent Labs  Lab 07/13/23 1651 07/13/23 2204 07/15/23 0610 07/16/23 0526 07/17/23 0242 07/18/23 0554 07/19/23 0525  WBC 15.5*   < > 15.2* 12.8* 6.7 17.2* 22.1*  NEUTROABS 13.9*  --   --   --  5.0  --   --   HGB 14.5   < > 13.1 13.2 14.2 13.0 13.1  HCT 41.4   < > 37.8* 38.7* 41.4 38.1* 37.8*  MCV 101.5*   < > 102.2* 101.0* 102.0* 103.8* 102.7*  PLT 147*   < > 116* 119* 107* 128* 111*   < > = values in this interval not displayed.   Cardiac Enzymes: No results for input(s): "CKTOTAL", "CKMB", "CKMBINDEX", "TROPONINI" in the last 168 hours. BNP: Invalid input(s): "POCBNP" CBG: Recent Labs  Lab 07/18/23 1137 07/18/23 1545 07/18/23 1742 07/18/23 2351 07/19/23 0528  GLUCAP 122* 146* 124* 127* 142*       IMAGING RESULTS:      ASSESSMENT AND PLAN   58 yo morbidly obese male with Post inguinal hernia fascial dehiscence developed ped Acute respiratory distress with hypoxemia with septic/hypovolumic shock   Severe ACUTE Hypoxic and Hypercapnic Respiratory Failure-RESOLVING Resolved Oxygen as needed Incentive spirometry High risk for intubation   SEPTIC shock/Hypovolemia SOURCE-ABD wound dehiscence -use vasopressors to keep MAP>65 as needed -follow ABG and LA as needed -aggressive IV fluid Resuscitation CVP 3   Alcoholism history - previous admission for ETOH withdrawal-states he has not drank in many months - CIWA protocol  - thiamine and folate  HERNIA REPAIR WITH WOUND DEHISCENCE   - surgery on case appreciate input - JP drain slowing down, h/h stable     - s/p revision  closure of abdominal wound    - continue zosyn    -continue NG to suction     - continue abdminal binder  INFECTIOUS DISEASE -continue antibiotics as prescribed -follow up cultures    ENDO - ICU hypoglycemic\Hyperglycemia protocol -check FSBS per protocol   GI GI PROPHYLAXIS as indicated NUTRITIONAL STATUS DIET-->TF's as tolerated Constipation protocol as indicated   ELECTROLYTES -follow labs as needed -replace as needed -pharmacy consultation and following  RESTRICTIVE TRANSFUSION PROTOCOL TRANSFUSION  IF HGB<7  or ACTIVE BLEEDING OR DX of ACUTE CORONARY SYNDROMES      DVT/GI PRX  assessed I Assessed the need for Labs I Assessed the need for Foley I Assessed the need for Central Venous Line Family Discussion when available I Assessed the need for Mobilization I made an Assessment of medications to be adjusted accordingly Safety Risk assessment completed  CASE DISCUSSED IN MULTIDISCIPLINARY ROUNDS WITH ICU TEAM  Critical Care Time devoted to patient care services described in this note is 55 minutes.  Critical care was necessary to treat /prevent imminent and life-threatening deterioration.    Lucie Leather, M.D.  [facility]  Medical Director [facility] Medical Director [facility] Cardio-Pulmonary Department

## 2023-07-19 NOTE — Progress Notes (Signed)
 Subjective:  CC: [name] is a 58 y.o. male  Hospital stay day 6, 2 Days Post-Op evisercation and retention suture placement  HPI: No acute issues reported overnight.  ROS:  General: Denies weight loss, weight gain, fatigue, fevers, chills, and night sweats. Heart: Denies chest pain, palpitations, racing heart, irregular heartbeat, leg pain or swelling, and decreased activity tolerance. Respiratory: Denies breathing difficulty, shortness of breath, wheezing, cough, and sputum. GI: Denies change in appetite, heartburn, nausea, vomiting, constipation, diarrhea, and blood in stool. GU: Denies difficulty urinating, pain with urinating, urgency, frequency, blood in urine.   Objective:   Temp:  [98.5 F (36.9 C)-101.9 F (38.8 C)] 100.3 F (37.9 C) (03/24 1500) Pulse Rate:  [97-128] 109 (03/24 1644) Resp:  [11-28] 21 (03/24 1644) BP: (98-165)/(31-141) 162/93 (03/24 1644) SpO2:  [94 %-98 %] 94 % (03/24 1644) Weight:  [174.3 kg] 174.3 kg (03/24 0500)     Height: 6\' 2"  (188 cm) Weight: (!) 174.3 kg BMI (Calculated): 49.32   Intake/Output this shift:   Intake/Output Summary (Last 24 hours) at 07/19/2023 1710 Last data filed at 07/19/2023 1600 Gross per 24 hour  Intake 4816.24 ml  Output 6128 ml  Net -1311.76 ml    Constitutional :  alert, cooperative, appears stated age, and mild distress  Respiratory:  clear to auscultation bilaterally  Cardiovascular:  Tachy rate  Gastrointestinal: soft, non-tender; bowel sounds normal; no masses,  no organomegaly. JP with serosanguinous  Skin: Cool and moist. Retention sutures intact  Psychiatric: Normal affect, non-agitated, not confused       LABS:     Latest Ref Rng & Units 07/19/2023    5:25 AM 07/18/2023    5:53 AM 07/17/2023    2:42 AM  CMP  Glucose 70 - 99 mg/dL 161  096  045   BUN 6 - 20 mg/dL 35  42  24   Creatinine 0.61 - 1.24 mg/dL 4.09  8.11  9.14   Sodium 135 - 145 mmol/L 137  133  134   Potassium 3.5 - 5.1 mmol/L 4.5   4.0  3.9   Chloride 98 - 111 mmol/L 102  102  101   CO2 22 - 32 mmol/L 23  23  24    Calcium 8.9 - 10.3 mg/dL 8.3  7.7  9.4   Total Protein 6.5 - 8.1 g/dL 5.6   6.6   Total Bilirubin 0.0 - 1.2 mg/dL 2.0   2.1   Alkaline Phos 38 - 126 U/L 54   64   AST 15 - 41 U/L 39   30   ALT 0 - 44 U/L 20   19       Latest Ref Rng & Units 07/19/2023    5:25 AM 07/18/2023    5:54 AM 07/17/2023    2:42 AM  CBC  WBC 4.0 - 10.5 K/uL 22.1  17.2  6.7   Hemoglobin 13.0 - 17.0 g/dL 78.2  95.6  21.3   Hematocrit 39.0 - 52.0 % 37.8  38.1  41.4   Platelets 150 - 400 K/uL 111  128  107     RADS: N/a Assessment:   S/p exlap, subsequent evisceration, retention suture placement.  Stable from surgery .  NG with very thick bilious output.  Continue decompression for likely ileus. JP serosanguinous, hgb stable.  Appreciate continued supportive care for aspiration pneumonitis.  labs/images/medications/previous chart entries reviewed personally and relevant changes/updates noted above.

## 2023-07-19 NOTE — Progress Notes (Signed)
 Dr. Belia Heman notified of temp 102.4. He is aware of downward titration of levo and elevated lactic acid. 1L LR bolus ordered, and MD to also order IV tylenol.

## 2023-07-19 NOTE — Progress Notes (Signed)
 PT Cancellation Note  Patient Details Name: [name] MRN: [id number] DOB: October 20, 1965   Cancelled Treatment:    Reason Eval/Treat Not Completed: Medical issues which prohibited therapy (Lethargic, on pressor support. PT will follow up tomorrow or as appropriate.)  [name], PT, MPT   Ina Homes 07/19/2023, 3:38 PM

## 2023-07-19 NOTE — Progress Notes (Signed)
 Nutrition Follow Up Note   DOCUMENTATION CODES:   Morbid obesity  INTERVENTION:   Continue TPN per pharmacy  MVI, thiamine and folic acid daily in TPN  Daily weights  If tube feeds initiated, recommend:  Osmolite 1.5@80ml /hr- Initiate at 16ml/hr and increase by 72ml/hr q6 hours until goal rate is reached.   ProSource TF 20- Give 60ml daily via tube, each supplement provides 80kcal and 20g of protein.   Free water flushes 30ml q4 hours to maintain tube patency   Regimen provides 2960kcal/day, 140g/day protein and 1661ml/day of free water.   NUTRITION DIAGNOSIS:   Inadequate oral intake related to altered GI function as evidenced by NPO status. -ongoing   GOAL:   Patient will meet greater than or equal to 90% of their needs -met   MONITOR:   Diet advancement, Labs, Weight trends, Skin, I & O's, Other (Comment) (TPN)  ASSESSMENT:   58 y/o male with h/o etoh abuse, HTN, OSA, asthma, depression, GERD, incarcerated ventral hernia s/p repair 2014 (no resections), hydrocele s/p right hydrocelectomy 2018 and bilateral inguinal hernias who is admitted for elective right inguinal hernia repair & intra-abdominal adhesions s/p diagnostic laparoscopy, converted to exploratory laparotomy & lysis of adhesions with aborted hernia repair 3/18 secondary to bleeding, hematoma and shock complicated by fascial dehiscence with small bowel evisceration s/p exploratory laparotomy, abdominal washout, abdominal closure with retention sutures 3/22.  Pt extubated 3/23. Pt remains lethargic. NGT to LIS with output. Pt is tolerating TPN at goal rate; will adjust estimated needs now that patient is extubated. Refeed labs stable. Once appropriate, would recommend enteral nutrition via NGT until patient is more alert and able to eat a sufficient amount as pt with increased estimated needs. No bowel function yet. Per chart, pt appears weight stable since admission. Pt -1.4L on his I & Os.    Medications reviewed and include: folic acid, insulin, protonix, thiamine, TPN, levophed, zosyn   Labs reviewed: K 4.5 wnl, P 2.9 wnl, Mg 2.2 wnl Triglycerides- 87 Wbc- 22.1(H) Cbgs- 146, 142 x 24 hrs   UOP-   Drains- 73ml   NUTRITION - FOCUSED PHYSICAL EXAM:  Flowsheet Row Most Recent Value  Orbital Region No depletion  Upper Arm Region No depletion  Thoracic and Lumbar Region No depletion  Buccal Region No depletion  Temple Region No depletion  Clavicle Bone Region No depletion  Clavicle and Acromion Bone Region No depletion  Scapular Bone Region No depletion  Dorsal Hand No depletion  Patellar Region No depletion  Anterior Thigh Region No depletion  Posterior Calf Region No depletion  Edema (RD Assessment) Mild  Hair Reviewed  Eyes Reviewed  Mouth Reviewed  Skin Reviewed  Nails Reviewed   Diet Order:   Diet Order             Diet NPO time specified  Diet effective now                  EDUCATION NEEDS:   No education needs have been identified at this time  Skin:  Skin Assessment: Reviewed RN Assessment (incision abdomen)  Last BM:  3/18  Height:   Ht Readings from Last 1 Encounters:  07/13/23 6\' 2"  (1.88 m)    Weight:   Wt Readings from Last 1 Encounters:  07/19/23 (!) 174.3 kg    Ideal Body Weight:  86.3 kg  BMI:  Body mass index is 49.34 kg/m.  Estimated Nutritional Needs:   Kcal:  2700-3000kcal/day  Protein:  135-150g/day  Fluid:  2.7-3.0L/day  Betsey Holiday MS, RD, LDN If unable to be reached, please send secure chat to "RD inpatient" available from 8:00a-4:00p daily

## 2023-07-19 NOTE — Progress Notes (Signed)
   07/19/23 0847  Spiritual Encounters  Type of Visit Follow up  Care provided to: Pt and family  Referral source Code page  Reason for visit Advance directives (Gave patient's daughter paperwork)  OnCall Visit Yes  Interventions  Spiritual Care Interventions Made Compassionate presence;Reflective listening;Narrative/life review  Intervention Outcomes  Outcomes Awareness around self/spiritual resourses  Spiritual Care Plan  Spiritual Care Issues Still Outstanding No further spiritual care needs at this time (see row info)

## 2023-07-20 ENCOUNTER — Other Ambulatory Visit: Payer: Self-pay

## 2023-07-20 DIAGNOSIS — K439 Ventral hernia without obstruction or gangrene: Secondary | ICD-10-CM | POA: Diagnosis not present

## 2023-07-20 DIAGNOSIS — J9602 Acute respiratory failure with hypercapnia: Secondary | ICD-10-CM | POA: Diagnosis not present

## 2023-07-20 DIAGNOSIS — A419 Sepsis, unspecified organism: Secondary | ICD-10-CM | POA: Diagnosis not present

## 2023-07-20 DIAGNOSIS — J9601 Acute respiratory failure with hypoxia: Secondary | ICD-10-CM | POA: Diagnosis not present

## 2023-07-20 LAB — BASIC METABOLIC PANEL
Anion gap: 5 (ref 5–15)
BUN: 28 mg/dL — ABNORMAL HIGH (ref 6–20)
CO2: 25 mmol/L (ref 22–32)
Calcium: 8.4 mg/dL — ABNORMAL LOW (ref 8.9–10.3)
Chloride: 105 mmol/L (ref 98–111)
Creatinine, Ser: 0.92 mg/dL (ref 0.61–1.24)
GFR, Estimated: >60 mL/min (ref >60)
Glucose, Bld: 138 mg/dL — ABNORMAL HIGH (ref 70–99)
Potassium: 4.5 mmol/L (ref 3.5–5.1)
Sodium: 135 mmol/L (ref 135–145)

## 2023-07-20 LAB — CBC
HCT: 36.7 % — ABNORMAL LOW (ref 39.0–52.0)
Hemoglobin: 12.6 g/dL — ABNORMAL LOW (ref 13.0–17.0)
MCH: 35.3 pg — ABNORMAL HIGH (ref 26.0–34.0)
MCHC: 34.3 g/dL (ref 30.0–36.0)
MCV: 102.8 fL — ABNORMAL HIGH (ref 80.0–100.0)
Platelets: 106 10*3/uL — ABNORMAL LOW (ref 150–400)
RBC: 3.57 MIL/uL — ABNORMAL LOW (ref 4.22–5.81)
RDW: 14.2 % (ref 11.5–15.5)
WBC: 29 10*3/uL — ABNORMAL HIGH (ref 4.0–10.5)
nRBC: 0.1 % (ref 0.0–0.2)

## 2023-07-20 LAB — PHOSPHORUS: Phosphorus: 2 mg/dL — ABNORMAL LOW (ref 2.5–4.6)

## 2023-07-20 LAB — LACTIC ACID, PLASMA
Lactic Acid, Venous: 1.5 mmol/L (ref 0.5–1.9)
Lactic Acid, Venous: 1.6 mmol/L (ref 0.5–1.9)

## 2023-07-20 LAB — GLUCOSE, CAPILLARY
Glucose-Capillary: 122 mg/dL — ABNORMAL HIGH (ref 70–99)
Glucose-Capillary: 130 mg/dL — ABNORMAL HIGH (ref 70–99)
Glucose-Capillary: 111 mg/dL — ABNORMAL HIGH (ref 70–99)
Glucose-Capillary: 85 mg/dL (ref 70–99)

## 2023-07-20 LAB — PREPARE RBC (CROSSMATCH)

## 2023-07-20 LAB — MAGNESIUM: Magnesium: 2.3 mg/dL (ref 1.7–2.4)

## 2023-07-20 MED ORDER — SODIUM PHOSPHATES 45 MMOLE/15ML IV SOLN
30.0000 mmol | Freq: Once | INTRAVENOUS | Status: AC
  Administered 2023-07-20: 30 mmol via INTRAVENOUS
  Filled 2023-07-20: qty 10

## 2023-07-20 MED ORDER — MEROPENEM 1 G IV SOLR
1.0000 g | Freq: Three times a day (TID) | INTRAVENOUS | Status: DC
  Administered 2023-07-20 – 2023-07-25 (×15): 1 g via INTRAVENOUS
  Filled 2023-07-20 (×18): qty 20

## 2023-07-20 MED ORDER — DEXTROSE 10 % IV SOLN: INTRAVENOUS | Status: DC

## 2023-07-20 MED ORDER — SODIUM CHLORIDE 0.9% FLUSH
10.0000 mL | Freq: Two times a day (BID) | INTRAVENOUS | Status: DC
  Administered 2023-07-20 – 2023-07-22 (×5): 10 mL
  Administered 2023-07-22: 30 mL
  Administered 2023-07-23 – 2023-07-25 (×6): 10 mL

## 2023-07-20 MED ORDER — SODIUM CHLORIDE 0.9% FLUSH: 10.0000 mL | INTRAVENOUS | Status: DC | PRN

## 2023-07-20 MED ORDER — FLUCONAZOLE IN SODIUM CHLORIDE 400-0.9 MG/200ML-% IV SOLN
400.0000 mg | INTRAVENOUS | Status: AC
  Administered 2023-07-21 – 2023-07-24 (×4): 400 mg via INTRAVENOUS
  Filled 2023-07-20 (×4): qty 200

## 2023-07-20 MED ORDER — FLUCONAZOLE IN SODIUM CHLORIDE 400-0.9 MG/200ML-% IV SOLN
800.0000 mg | Freq: Once | INTRAVENOUS | Status: AC
  Administered 2023-07-20: 800 mg via INTRAVENOUS
  Filled 2023-07-20: qty 400

## 2023-07-20 MED ORDER — TRAVASOL 10 % IV SOLN
INTRAVENOUS | Status: AC
  Filled 2023-07-20: qty 1440

## 2023-07-20 NOTE — Progress Notes (Signed)
 PHARMACY - TOTAL PARENTERAL NUTRITION CONSULT NOTE   Indication: Prolonged ileus  Patient Measurements: Height: 6\' 2"  (188 cm) Weight: (!) 174.3 kg (384 lb 4.2 oz) IBW/kg (Calculated) : 82.2 TPN AdjBW (KG): 101.3 Body mass index is 49.34 kg/m.  Assessment:  58 y.o. male w/ PMH of morbid obesity, mild intermittent asthma, right inguinal hernia, GERD, OSA, migraines admitted on 07/13/2023 with a ventral hernia.   Glucose / Insulin: BG 124 - 153 / 6 unit SSI required Electrolytes: slight hyponatremia Renal: SCr 0.94>1.69>1.21>0.92 Hepatic: bilirubin elevated, stable / INR 1.3 --LFTs wnl --PLT low at baseine, stable Intake / Output; MIVF: net -2.4L GI Imaging: 03/22 abd Xray Dilatation of small bowel in the mid to lower abdomen, Mild to moderate fecal stasis   GI Surgeries / Procedures:  03/22 Exploratory Laparotomy, abdominal washout, abdominal closure   Nutritional Goals: Goal TPN rate is 120 mL/hr (provides 144 g of protein and 2718 kcals per day)  RD Assessment: Estimated Needs Total Energy Estimated Needs: 2700-3000kcal/day Total Protein Estimated Needs: 135-150g/day Total Fluid Estimated Needs: 2.7-3.0L/day   Central access: 07/16/23 TPN start date: 07/17/23  Current Nutrition:  NPO  Plan:  ---Transitioning to compounded TPN today with a rate of 120 ml/hr at 1800 ---Electrolytes in TPN: Na 37mEq/L, K 32mEq/L, Ca 14mEq/L, Mg 37mEq/L, and Phos 73mmol/L. Cl:Ac 1:1 ---Add standard MVI and trace elements to TPN ---thiamine 100mg  and folic acid 1mg  being supplemented outside of TPN ---continue Sensitive q6h SSI and adjust as needed  ---Monitor TPN labs on Mon/Thurs, daily until stable  Ronella Plunk A Kelven Flater 07/20/2023,11:38 AM

## 2023-07-20 NOTE — Progress Notes (Addendum)
 0800 Discussed getting OOB with Dr.Kasa. Verbal order to get patient up with Levophed infusing. 0830 Patient OOB to chair with minimal help. 0900 Patient complained of nausea. Medicated per orders. B/P low. Patient alert and oriented. Low B/P reported to Dr. Belia Heman. No orders received. 1000 Back to bed per patient request. Tolerated move well.  1150 IV team in to place PICC line. Tolerated procedure well.  1215 Patient stated he was exhausted and wished to just rest.1400 resting quietly. Dr Tonna Boehringer in to see patient. Vigorous NG irrigation by Dr Tonna Boehringer. NG drainage thick and green in color.

## 2023-07-20 NOTE — Progress Notes (Signed)
 Subjective:  CC: [name] is a 58 y.o. male  Hospital stay day 7, 3 Days Post-Op evisercation and retention suture placement  HPI: No acute issues reported overnight. Passing flatus, more alert this pm. Abdominal pain better.  ROS:  General: Denies weight loss, weight gain, fatigue, fevers, chills, and night sweats. Heart: Denies chest pain, palpitations, racing heart, irregular heartbeat, leg pain or swelling, and decreased activity tolerance. Respiratory: Denies breathing difficulty, shortness of breath, wheezing, cough, and sputum. GI: Denies change in appetite, heartburn, nausea, vomiting, constipation, diarrhea, and blood in stool. GU: Denies difficulty urinating, pain with urinating, urgency, frequency, blood in urine.   Objective:   Temp:  [98.6 F (37 C)-102.4 F (39.1 C)] 98.8 F (37.1 C) (03/25 0800) Pulse Rate:  [99-125] 113 (03/25 1400) Resp:  [12-24] 20 (03/25 1400) BP: (88-162)/(49-93) 133/69 (03/25 1400) SpO2:  [89 %-97 %] 96 % (03/25 1400) Weight:  [174.3 kg] 174.3 kg (03/25 0427)     Height: 6\' 2"  (188 cm) Weight: (!) 174.3 kg BMI (Calculated): 49.32   Intake/Output this shift:   Intake/Output Summary (Last 24 hours) at 07/20/2023 1450 Last data filed at 07/20/2023 1400 Gross per 24 hour  Intake 4164.94 ml  Output 6099 ml  Net -1934.06 ml    Constitutional :  alert, cooperative, appears stated age, and mild distress  Respiratory:  clear to auscultation bilaterally  Cardiovascular:  Tachy rate  Gastrointestinal: soft, non-tender; bowel sounds normal; no masses,  no organomegaly. JP with serosanguinous  Skin: Cool and moist. Retention sutures intact  Psychiatric: Normal affect, non-agitated, not confused       LABS:     Latest Ref Rng & Units 07/20/2023    5:10 AM 07/19/2023    5:25 AM 07/18/2023    5:53 AM  CMP  Glucose 70 - 99 mg/dL 782  956  213   BUN 6 - 20 mg/dL 28  35  42   Creatinine 0.61 - 1.24 mg/dL 0.86  5.78  4.69   Sodium 135 - 145  mmol/L 135  137  133   Potassium 3.5 - 5.1 mmol/L 4.5  4.5  4.0   Chloride 98 - 111 mmol/L 105  102  102   CO2 22 - 32 mmol/L 25  23  23    Calcium 8.9 - 10.3 mg/dL 8.4  8.3  7.7   Total Protein 6.5 - 8.1 g/dL  5.6    Total Bilirubin 0.0 - 1.2 mg/dL  2.0    Alkaline Phos 38 - 126 U/L  54    AST 15 - 41 U/L  39    ALT 0 - 44 U/L  20        Latest Ref Rng & Units 07/20/2023    5:10 AM 07/19/2023    5:25 AM 07/18/2023    5:54 AM  CBC  WBC 4.0 - 10.5 K/uL 29.0  22.1  17.2   Hemoglobin 13.0 - 17.0 g/dL 62.9  52.8  41.3   Hematocrit 39.0 - 52.0 % 36.7  37.8  38.1   Platelets 150 - 400 K/uL 106  111  128     RADS: N/a Assessment:   S/p exlap, subsequent evisceration, retention suture placement.  Stable from surgery .  NG still with very thick bilious output, but decreasing output?  Continue decompression for likely ileus. Small amounts of ice chips ok, in hopes of thin bilious output. JP remains serosanguinous. Appreciate continued supportive care for aspiration pneumonitis.  labs/images/medications/previous chart entries  reviewed personally and relevant changes/updates noted above.

## 2023-07-20 NOTE — Plan of Care (Signed)

## 2023-07-20 NOTE — Evaluation (Signed)
 Physical Therapy Re-Evaluation Patient Details Name: [name] MRN: [id number] DOB: 08-24-1965 Today's Date: 07/20/2023  History of Present Illness  Patient is a 58 year old male here for elective  inguinal hernia repair. Had bleeding  required electrocautery and ligature for hemostasis. After getting blood products, patient deteriorated, treated for shock and hypoxemia. Had rapid response with small bowel evisceration secondary to fascial dehiscence requiring surgery for evisercation and retention suture placement. Requiring pressor support.   Clinical Impression  Patient lethargic during session and refusing to get out of bed. He was up to the chair today with minimal help from nursing. Abdominal binder in place. Patient was agreeable to in bed exercises for strengthening which were encouraged to maximize independence with functional activity. Education on logroll technique for bed mobility to protect abdominal incision. No significant change in vitals with activity and no reported pain in the abdomen. Supportive daughter at bedside confirms that patient wants to go home at discharge. May need to consider rehabilitation < 3 hours/day pending progress with independence and activity tolerance. PT will continue to follow.       If plan is discharge home, recommend the following: A lot of help with walking and/or transfers;A lot of help with bathing/dressing/bathroom;Assistance with cooking/housework;Assist for transportation;Help with stairs or ramp for entrance   Can travel by private vehicle   No    Equipment Recommendations Hospital bed;Rollator (4 wheels);BSC/3in1  Recommendations for Other Services       Functional Status Assessment Patient has had a recent decline in their functional status and demonstrates the ability to make significant improvements in function in a reasonable and predictable amount of time.     Precautions / Restrictions Precautions Precautions: Fall Recall  of Precautions/Restrictions: Intact Precaution/Restrictions Comments: abdominal binder, abdominal incision, NG tube Restrictions Weight Bearing Restrictions Per Provider Order: No      Mobility  Bed Mobility Overal bed mobility: Needs Assistance Bed Mobility: Rolling Rolling: Max assist         General bed mobility comments: Max A for rolling to the right. anticiapte patient will do better when more awake as he was out of bed to chair today with nursing assistance with minimal assistance. patient refused getting out of bed at this time due to fatigue and lethargy    Transfers                        Ambulation/Gait                  Stairs            Wheelchair Mobility     Tilt Bed    Modified Rankin (Stroke Patients Only)       Balance                                             Pertinent Vitals/Pain Pain Assessment Pain Assessment: No/denies pain (at rest)    Home Living Family/patient expects to be discharged to:: Private residence Living Arrangements: Children;Other relatives                      Prior Function Prior Level of Function : Independent/Modified Independent;Driving             Mobility Comments: independent community ambulation       Extremity/Trunk Assessment   Upper  Extremity Assessment Upper Extremity Assessment: Generalized weakness    Lower Extremity Assessment Lower Extremity Assessment: Generalized weakness       Communication   Communication Communication: No apparent difficulties    Cognition Arousal: Lethargic, Suspect due to medications Behavior During Therapy: WFL for tasks assessed/performed   PT - Cognitive impairments: No apparent impairments                         Following commands: Impaired Following commands impaired: Follows one step commands with increased time     Cueing Cueing Techniques: Verbal cues, Visual cues, Tactile cues      General Comments General comments (skin integrity, edema, etc.): educated patient and family on logroll technique and weaing abdominal binder. PT goals have been adjusted accordingly based on change in status with additional surgery, requirement of pressor support and ICU level care.    Exercises General Exercises - Lower Extremity Ankle Circles/Pumps: AAROM, PROM, Strengthening, Both, 10 reps, Supine Heel Slides: AAROM, PROM, Strengthening, Both, 10 reps, Supine Hip ABduction/ADduction: PROM, AAROM, Strengthening, Both, 10 reps, Supine Other Exercises Other Exercises: encouraged AROM of extremities as able when awake. education provided to daughter   Assessment/Plan    PT Assessment Patient needs continued PT services  PT Problem List Decreased strength;Decreased range of motion;Decreased activity tolerance;Decreased balance;Decreased mobility;Decreased cognition;Decreased knowledge of use of DME;Decreased safety awareness;Obesity;Pain;Cardiopulmonary status limiting activity       PT Treatment Interventions DME instruction;Gait training;Stair training;Functional mobility training;Therapeutic activities;Therapeutic exercise;Balance training;Neuromuscular re-education;Cognitive remediation;Patient/family education;Wheelchair mobility training    PT Goals (Current goals can be found in the Care Plan section)  Acute Rehab PT Goals Patient Stated Goal: to go home PT Goal Formulation: With patient/family Time For Goal Achievement: 08/03/23 Potential to Achieve Goals: Fair    Frequency Min 2X/week     Co-evaluation               AM-PAC PT "6 Clicks" Mobility  Outcome Measure Help needed turning from your back to your side while in a flat bed without using bedrails?: A Lot Help needed moving from lying on your back to sitting on the side of a flat bed without using bedrails?: A Lot Help needed moving to and from a bed to a chair (including a wheelchair)?: A Lot Help needed  standing up from a chair using your arms (e.g., wheelchair or bedside chair)?: A Lot Help needed to walk in hospital room?: Total Help needed climbing 3-5 steps with a railing? : Total 6 Click Score: 10    End of Session   Activity Tolerance: Patient limited by fatigue;Patient limited by lethargy Patient left: in bed;with call bell/phone within reach;with family/visitor present;with SCD's reapplied Nurse Communication: Mobility status PT Visit Diagnosis: Unsteadiness on feet (R26.81);Difficulty in walking, not elsewhere classified (R26.2);Muscle weakness (generalized) (M62.81)    Time: 1610-9604 PT Time Calculation (min) (ACUTE ONLY): 14 min   Charges:   PT Evaluation $PT Re-evaluation: 1 Re-eval   PT General Charges $$ ACUTE PT VISIT: 1 Visit         Donna Bernard, PT, MPT   Ina Homes 07/20/2023, 12:39 PM

## 2023-07-20 NOTE — Progress Notes (Signed)
 [facility] [place] Pulmonary Medicine Consultation    Name: CURLY MACKOWSKI MRN: [id number] DOB: 1965-07-01     LOS: 7  CC Follow up septic shock Wound dehiscence  HPI 58 yo M with hx of obesity , OSA, , EtOH abuse hx of admission to hospital for ETOH withdrawal.  He came in for elective surgery to have inguinal hernia repair with lysis of adhesions.  He had significant bleeding per surgeon which was unusual and required electrocautery and ligature for hemostasis.  He was brought back with abdominal JP drain and had FFP and platelets ordered due to intraoperative bleeding.  After receiving these blood products patient deteriorated and developed circulatory shock, hypoxemia, swelling worse on lower extremities.  He had code RRT called and was emergently brought to ICU due to shock with hypoxemia. He was treated for anaphylactic non febrile blood product reaction vs TRALI and improved overnight.  He became stable and was downgraded to medical floor.    Ive discussed case with Blood bank - [facility] -Interim charge and Jimmy Picket -Director of blood bank, we discussed patients reaction and there will be additional workup for this anaphylactic reaction vs Transfusion related acute lung injury.      HOSPITAL COURSE 07/17/23- patient had code RRT overnight, he had findings of small bowel evisceration secondary to fascial dehiscence.  This likely stemmed from forceful cough per surgery service.  He became unstable in distress and required endotracheal intubation.  He was taken to OR emergently for ex-lap and closure of abdominal cavity.  Daughter was available to come in and we reviewed medical plan. CVl placed  07/18/23- extubated but reports severe pain. Daughter at bedside and patient is improved able to speak in full sentences.  Reviewed case with surgery and placed abdominal binder.  He is on CIWA due to reported daily drinking.   3/24 lethargic but arousable, remains  on pressors  3/25  remains on pressors, foley in place      EVENTS OVERNIGHT Remains critically ill Remains on pressors 4L [place] CVP around 11-14  WBC increasing Change abx to Meropenem  and antifungal On TPN  Lines/tubes : Closed System Drain Left LUQ Bulb (JP) 19 Fr. (Active)  Dressing Status Clean, Dry, Intact 07/13/23 1458  Output (mL) 40 mL 07/13/23 1458     Urethral Catheter morgan Double-lumen 16 Fr. (Active)  Site Assessment Clean, Dry, Intact 07/13/23 1458  Output (mL) 175 mL 07/13/23 1458    Microbiology/Sepsis markers: Results for orders placed or performed during the hospital encounter of 07/13/23  Urine Culture     Status: None   Collection Time: 07/13/23  4:51 PM   Specimen: Urine, Random  Result Value Ref Range Status   Specimen Description   Final    URINE, RANDOM Performed at [facility], [address] [place], [place] [address]    Special Requests   Final    NONE Reflexed from [phone] Performed at [facility], [address] [place], [place] [address]    Culture   Final    NO GROWTH Performed at [facility], [address] [place], [place] [address]    Report Status 07/14/2023 FINAL  Final  Culture, blood (Routine X 2) w Reflex to ID Panel     Status: None   Collection Time: 07/13/23  5:59 PM   Specimen: Right Antecubital; Blood  Result Value Ref Range Status   Specimen Description RIGHT ANTECUBITAL  Final   Special Requests  Final    BOTTLES DRAWN AEROBIC AND ANAEROBIC Blood Culture adequate volume   Culture   Final    NO GROWTH 5 DAYS Performed at [facility], [address] [place], [place] [address]    Report Status 07/18/2023 FINAL  Final  Culture, blood (Routine X 2) w Reflex to ID Panel     Status: None   Collection Time: 07/13/23  6:20 PM   Specimen: BLOOD RIGHT HAND  Result Value Ref Range Status   Specimen Description BLOOD RIGHT HAND RT THUMB  Final   Special Requests   Final    BOTTLES DRAWN AEROBIC  ONLY Blood Culture results may not be optimal due to an inadequate volume of blood received in culture bottles   Culture   Final    NO GROWTH 5 DAYS Performed at [facility], [address], [place], [place] [address]    Report Status 07/18/2023 FINAL  Final  MRSA Next Gen by PCR, Nasal     Status: None   Collection Time: 07/14/23  2:06 AM   Specimen: Nasal Mucosa; Nasal Swab  Result Value Ref Range Status   MRSA by PCR Next Gen NOT DETECTED NOT DETECTED Final    Comment: (NOTE) The GeneXpert MRSA Assay (FDA approved for NASAL specimens only), is one component of a comprehensive MRSA colonization surveillance program. It is not intended to diagnose MRSA infection nor to guide or monitor treatment for MRSA infections. Test performance is not FDA approved in patients less than 83 years old. Performed at [facility], [address] [place], [place] [address]     Anti-infectives:  Anti-infectives (From admission, onward)    Start     Dose/Rate Route Frequency Ordered Stop   07/17/23 1400  piperacillin-tazobactam (ZOSYN) IVPB 3.375 g        3.375 g 12.5 mL/hr over 240 Minutes Intravenous Every 8 hours 07/17/23 0753     07/17/23 0600  piperacillin-tazobactam (ZOSYN) IVPB 3.375 g        3.375 g 100 mL/hr over 30 Minutes Intravenous On call to O.R. 07/17/23 0349 07/17/23 0503   07/15/23 1045  amoxicillin-clavulanate (AUGMENTIN) 875-125 MG per tablet 1 tablet  Status:  Discontinued        1 tablet Oral Every 12 hours 07/15/23 0955 07/17/23 0753   07/13/23 1830  piperacillin-tazobactam (ZOSYN) IVPB 3.375 g  Status:  Discontinued        3.375 g 12.5 mL/hr over 240 Minutes Intravenous Every 8 hours 07/13/23 1743 07/15/23 0955   07/13/23 1815  cefTRIAXone (ROCEPHIN) 1 g in sodium chloride 0.9 % 100 mL IVPB  Status:  Discontinued        1 g 200 mL/hr over 30 Minutes Intravenous Every 24 hours 07/13/23 1728 07/13/23 1729   07/13/23 0715  ceFAZolin (ANCEF) IVPB 3g/150  mL premix        3 g 300 mL/hr over 30 Minutes Intravenous On call to O.R. 07/13/23 0700 07/13/23 9147        Consults: Treatment Team:  Sung Amabile, DO    PAST MEDICAL HISTORY   Past Medical History:  Diagnosis Date   Asthma    as a child   Bronchitis 03/2016   pt has recovered from bronchitis   Chickenpox    Depression    Frequent headaches    GERD (gastroesophageal reflux disease)    when I was younger, better now.   Hypertension    Migraines    history of, not  now   OSA (obstructive sleep apnea)      SURGICAL HISTORY   Past Surgical History:  Procedure Laterality Date   APPENDECTOMY  1977   HERNIA REPAIR     HYDROCELE EXCISION Right 06/01/2016   Procedure: HYDROCELECTOMY ADULT;  Surgeon: Vanna Scotland, MD;  Location: [facility] ORS;  Service: Urology;  Laterality: Right;   knee Left 1994   KNEE ARTHROSCOPY Right 1992   LAPAROTOMY N/A 07/13/2023   Procedure: LAPAROTOMY, EXPLORATORY;  Surgeon: Sung Amabile, DO;  Location: [facility] ORS;  Service: General;  Laterality: N/A;  Pink pad for case is needed possilble open   LAPAROTOMY N/A 07/17/2023   Procedure: LAPAROTOMY, EXPLORATORY, ABDOMINAL WASHOUT WITH CLOSURE;  Surgeon: Henrene Dodge, MD;  Location: [facility] ORS;  Service: General;  Laterality: N/A;   UMBILICAL HERNIA REPAIR  2013   VASECTOMY N/A 06/01/2016   Procedure: VASECTOMY;  Surgeon: Vanna Scotland, MD;  Location: [facility] ORS;  Service: Urology;  Laterality: N/A;     FAMILY HISTORY   Family History  Problem Relation Age of Onset   Ovarian cancer Mother    Alcohol abuse Father    Hyperlipidemia Father    Hypertension Father    Arthritis Maternal Grandmother    Colon cancer Maternal Grandmother    Ovarian cancer Maternal Grandmother    Breast cancer Maternal Grandmother    Hyperlipidemia Maternal Grandfather    Hypertension Maternal Grandfather    Hyperlipidemia Paternal Grandmother    Heart attack Paternal Grandmother    Hyperlipidemia Paternal Grandfather     Hypertension Paternal Grandfather    Prostate cancer Maternal Uncle    Bladder Cancer Neg Hx    Kidney disease Neg Hx      SOCIAL HISTORY   Social History   Tobacco Use   Smoking status: Every Day    Current packs/day: 0.50    Types: Cigarettes   Smokeless tobacco: Never   Tobacco comments:    March 11 2016  Vaping Use   Vaping status: Never Used  Substance Use Topics   Alcohol use: Yes    Alcohol/week: 4.0 - 6.0 standard drinks of alcohol    Types: 4 - 6 Shots of liquor per week   Drug use: No     MEDICATIONS   Current Medication:  Current Facility-Administered Medications:    .TPN (CLINIMIX-E) Adult, , Intravenous, Continuous TPN, Last Rate: 84 mL/hr at 07/20/23 0600, Infusion Verify at 07/20/23 0600 **AND** [EXPIRED] fat emulsion 20 % (SMOFLIPID) infusion, 250 mL, Intravenous, Continuous TPN, Nazari, Walid A, RPH, Last Rate: 21 mL/hr at 07/20/23 0600, Infusion Verify at 07/20/23 0600   0.9 %  sodium chloride infusion (Manually program via Guardrails IV Fluids), , Intravenous, Once, Henrene Dodge, MD, Held at 07/13/23 1900   0.9 %  sodium chloride infusion (Manually program via Guardrails IV Fluids), , Intravenous, Once, Piscoya, Jose, MD   Chlorhexidine Gluconate Cloth 2 % PADS 6 each, 6 each, Topical, Daily, Piscoya, Jose, MD, 6 each at 07/19/23 1644   chlorpheniramine-HYDROcodone (TUSSIONEX) 10-8 MG/5ML suspension 10 mL, 10 mL, Per Tube, TID, Vida Rigger, MD, 10 mL at 07/19/23 2144   diphenhydrAMINE (BENADRYL) injection 25 mg, 25 mg, Intravenous, Q6H PRN, Piscoya, Jose, MD   folic acid (FOLVITE) tablet 1 mg, 1 mg, Oral, Daily, Aleskerov, Fuad, MD, 1 mg at 07/19/23 1031   HYDROmorphone (DILAUDID) injection 1 mg, 1 mg, Intravenous, Q2H PRN, Piscoya, Jose, MD, 1 mg at 07/19/23 1644   insulin aspart (novoLOG) injection 0-9 Units, 0-9 Units, Subcutaneous, Q6H,  Lowella Bandy, RPH, 1 Units at 07/20/23 6010   LORazepam (ATIVAN) tablet 1-4 mg, 1-4 mg, Oral, Q1H PRN  **OR** LORazepam (ATIVAN) injection 1-4 mg, 1-4 mg, Intravenous, Q1H PRN, Vida Rigger, MD   multivitamin with minerals tablet 1 tablet, 1 tablet, Oral, Daily, Vida Rigger, MD, 1 tablet at 07/19/23 1032   norepinephrine (LEVOPHED) 16 mg in (0.064 mg/mL) premix infusion, 0-40 mcg/min, Intravenous, Titrated, Ouma, Hubbard Hartshorn, NP, Last Rate: 6.56 mL/hr at 07/20/23 0600, 7 mcg/min at 07/20/23 0600   ondansetron (ZOFRAN-ODT) disintegrating tablet 4 mg, 4 mg, Oral, Q6H PRN **OR** ondansetron (ZOFRAN) injection 4 mg, 4 mg, Intravenous, Q6H PRN, Piscoya, Jose, MD, 4 mg at 07/20/23 9323   Oral care mouth rinse, 15 mL, Mouth Rinse, PRN, Piscoya, Jose, MD   pantoprazole (PROTONIX) injection 40 mg, 40 mg, Intravenous, Q24H, Piscoya, Jose, MD, 40 mg at 07/19/23 2144   piperacillin-tazobactam (ZOSYN) IVPB 3.375 g, 3.375 g, Intravenous, Q8H, Lowella Bandy, RPH, Last Rate: 12.5 mL/hr at 07/20/23 0600, Infusion Verify at 07/20/23 0600   sodium chloride flush (NS) 0.9 % injection 10-40 mL, 10-40 mL, Intracatheter, Q12H, Piscoya, Jose, MD, 10 mL at 07/19/23 2144   sodium chloride flush (NS) 0.9 % injection 10-40 mL, 10-40 mL, Intracatheter, PRN, Piscoya, Jose, MD   sodium phosphate 30 mmol in sodium chloride 0.9 % 250 mL infusion, 30 mmol, Intravenous, Once, Ardis Rowan, Walid A, RPH   thiamine (VITAMIN B1) tablet 100 mg, 100 mg, Oral, Daily **OR** thiamine (VITAMIN B1) injection 100 mg, 100 mg, Intravenous, Daily, Karna Christmas, Fuad, MD, 100 mg at 07/19/23 1020    ALLERGIES   Sulfa antibiotics    REVIEW OF SYSTEMS   ROS limited due to lethargy  PHYSICAL EXAMINATION   Vital Signs: Temp:  [98.6 F (37 C)-102.4 F (39.1 C)] 98.6 F (37 C) (03/25 0400) Pulse Rate:  [97-125] 109 (03/25 0915) Resp:  [12-28] 18 (03/25 0915) BP: (88-162)/(45-93) 88/70 (03/25 0915) SpO2:  [89 %-98 %] 89 % (03/25 0915) Weight:  [174.3 kg] 174.3 kg (03/25 0427)     Review of Systems: Gen:  Denies  fever,  sweats, chills weight loss  HEENT: Denies blurred vision, double vision, ear pain, eye pain, hearing loss, nose bleeds, sore throat Cardiac:  No dizziness, chest pain or heaviness, chest tightness,edema, No JVD Resp:   No cough, -sputum production, -shortness of breath,-wheezing, -hemoptysis,  Other:  All other systems negative   Physical Examination:   General Appearance: No distress  EYES PERRLA, EOM intact.   NECK Supple, No JVD Pulmonary: normal breath sounds, No wheezing.  CardiovascularNormal S1,S2.  No m/r/g.   Abdomen:Soft, post surgery-tender without peritoneal signs, distended. No masses. Midline incision is clean, dry, intact with staples and retention sutures. Blake drain with serosanguineous fluid.  Neurology UE/LE 5/5 strength, no focal deficits Ext pulses intact, cap refill intact ALL OTHER ROS ARE NEGATIVE    PERTINENT DATA     Infusions:  .TPN (CLINIMIX-E) Adult 84 mL/hr at 07/20/23 0600   norepinephrine (LEVOPHED) Adult infusion 7 mcg/min (07/20/23 0600)   piperacillin-tazobactam (ZOSYN)  IV 12.5 mL/hr at 07/20/23 0600   sodium PHOSPHATE IVPB (in mmol)     Scheduled Medications:  sodium chloride   Intravenous Once   sodium chloride   Intravenous Once   Chlorhexidine Gluconate Cloth  6 each Topical Daily   chlorpheniramine-HYDROcodone  10 mL Per Tube TID   folic acid  1 mg Oral Daily   insulin aspart  0-9 Units Subcutaneous Q6H  multivitamin with minerals  1 tablet Oral Daily   pantoprazole (PROTONIX) IV  40 mg Intravenous Q24H   sodium chloride flush  10-40 mL Intracatheter Q12H   thiamine  100 mg Oral Daily   Or   thiamine  100 mg Intravenous Daily   PRN Medications: diphenhydrAMINE, HYDROmorphone (DILAUDID) injection, LORazepam **OR** LORazepam, ondansetron **OR** ondansetron (ZOFRAN) IV, mouth rinse, sodium chloride flush Hemodynamic parameters: CVP:  [0 mmHg-26 mmHg] 23 mmHg Intake/Output: 03/24 0701 - 03/25 0700 In: 6607.3 [I.V.:2593.8;  NG/GT:120; IV Piggyback:3893.6] Out: 6195 [Urine:3450; Emesis/NG output:2600; Drains:145]  Ventilator  Settings:     LAB RESULTS:  Basic Metabolic Panel: Recent Labs  Lab 07/15/23 0610 07/16/23 0526 07/17/23 0242 07/18/23 0553 07/18/23 0554 07/19/23 0525 07/20/23 0510  NA 135 138 134* 133*  --  137 135  K 4.7 4.4 3.9 4.0  --  4.5 4.5  CL 106 105 101 102  --  102 105  CO2 24 26 24 23   --  23 25  GLUCOSE 142* 105* 117* 139*  --  153* 138*  BUN 30* 23* 24* 42*  --  35* 28*  CREATININE 1.13 0.89 0.94 1.69*  --  1.21 0.92  CALCIUM 8.6* 9.1 9.4 7.7*  --  8.3* 8.4*  MG 2.3 2.1 2.1  --  1.9 2.2 2.3  PHOS 3.4 3.7  --  4.7*  --  2.9 2.0*   Liver Function Tests: Recent Labs  Lab 07/13/23 1533 07/14/23 0358 07/15/23 0610 07/16/23 0526 07/17/23 0242 07/18/23 0553 07/19/23 0525  AST 33  --   --   --  30  --  39  ALT 17  --   --   --  19  --  20  ALKPHOS 79  --   --   --  64  --  54  BILITOT 2.0*  --   --   --  2.1*  --  2.0*  PROT 5.9*  --   --   --  6.6  --  5.6*  ALBUMIN 2.7*   < > 3.0* 3.1* 3.2* 2.4* 2.4*   < > = values in this interval not displayed.   No results for input(s): "LIPASE", "AMYLASE" in the last 168 hours. No results for input(s): "AMMONIA" in the last 168 hours. CBC: Recent Labs  Lab 07/13/23 1651 07/13/23 2204 07/16/23 0526 07/17/23 0242 07/18/23 0554 07/19/23 0525 07/20/23 0510  WBC 15.5*   < > 12.8* 6.7 17.2* 22.1* 29.0*  NEUTROABS 13.9*  --   --  5.0  --   --   --   HGB 14.5   < > 13.2 14.2 13.0 13.1 12.6*  HCT 41.4   < > 38.7* 41.4 38.1* 37.8* 36.7*  MCV 101.5*   < > 101.0* 102.0* 103.8* 102.7* 102.8*  PLT 147*   < > 119* 107* 128* 111* 106*   < > = values in this interval not displayed.   Cardiac Enzymes: No results for input(s): "CKTOTAL", "CKMB", "CKMBINDEX", "TROPONINI" in the last 168 hours. BNP: Invalid input(s): "POCBNP" CBG: Recent Labs  Lab 07/19/23 0528 07/19/23 1113 07/19/23 1819 07/19/23 2322 07/20/23 0513  GLUCAP  142* 146* 143* 131* 122*       IMAGING RESULTS:      ASSESSMENT AND PLAN   58 yo morbidly obese male with Post inguinal hernia fascial dehiscence developed ped Acute respiratory distress with hypoxemia with septic/hypovolumic shock   Severe ACUTE Hypoxic and Hypercapnic Respiratory Failure-RESOLVING Resolved Oxygen as needed Incentive  spirometry Out of bed to chair as tolerted   SEPTIC shock/Hypovolemia SOURCE-ABD wound dehiscence -use vasopressors to keep MAP>65 as needed -follow ABG and LA as needed -emperic ABX   Alcoholism history - previous admission for ETOH withdrawal-states he has not drank in many months - CIWA protocol  - thiamine and folate  HERNIA REPAIR WITH WOUND DEHISCENCE   - surgery on case appreciate input - JP drain slowing down, h/h stable     - s/p revision closure of abdominal wound    -continue NG to suction     - continue abdminal binder  INFECTIOUS DISEASE -continue antibiotics as prescribed -follow up cultures Changed ABX today    ENDO - ICU hypoglycemic\Hyperglycemia protocol -check FSBS per protocol   GI GI PROPHYLAXIS as indicated NUTRITIONAL STATUS DIET-->TF's as tolerated Constipation protocol as indicated   ELECTROLYTES -follow labs as needed -replace as needed -pharmacy consultation and following  RESTRICTIVE TRANSFUSION PROTOCOL TRANSFUSION  IF HGB<7  or ACTIVE BLEEDING OR DX of ACUTE CORONARY SYNDROMES        DVT/GI PRX  assessed I Assessed the need for Labs I Assessed the need for Foley I Assessed the need for Central Venous Line Family Discussion when available I Assessed the need for Mobilization I made an Assessment of medications to be adjusted accordingly Safety Risk assessment completed  CASE DISCUSSED IN MULTIDISCIPLINARY ROUNDS WITH ICU TEAM     Critical Care Time devoted to patient care services described in this note is 45 minutes.  Critical care was necessary to treat /prevent  imminent and life-threatening deterioration.  Lucie Leather, M.D.  [facility]  Medical Director [facility] Medical Director [facility] Cardio-Pulmonary Department

## 2023-07-20 NOTE — Progress Notes (Signed)
 Peripherally Inserted Central Catheter Placement  The IV Nurse has discussed with the patient and/or persons authorized to consent for the patient, the purpose of this procedure and the potential benefits and risks involved with this procedure.  The benefits include less needle sticks, lab draws from the catheter, and the patient may be discharged home with the catheter. Risks include, but not limited to, infection, bleeding, blood clot (thrombus formation), and puncture of an artery; nerve damage and irregular heartbeat and possibility to perform a PICC exchange if needed/ordered by physician.  Alternatives to this procedure were also discussed.  Bard Power PICC patient education guide, fact sheet on infection prevention and patient information card has been provided to patient /or left at bedside.    PICC Placement Documentation  PICC Triple Lumen 07/20/23 Right 43 cm 0 cm (Active)  Indication for Insertion or Continuance of Line Vasoactive infusions 07/20/23 1145  Exposed Catheter (cm) 0 cm 07/20/23 1145  Site Assessment Clean, Dry, Intact 07/20/23 1145  Lumen #1 Status Flushed;Blood return noted;Saline locked 07/20/23 1145  Lumen #2 Status Flushed;Blood return noted;Saline locked 07/20/23 1145  Lumen #3 Status Flushed;Blood return noted;Saline locked 07/20/23 1145  Dressing Type Transparent 07/20/23 1145  Dressing Status Clean, Dry, Intact 07/20/23 1145  Line Care Connections checked and tightened 07/20/23 1145  Line Adjustment (NICU/IV Team Only) No 07/20/23 1145  Dressing Change Due 07/27/23 07/20/23 1145   Daughter signed consent, pt is so sleepy.    Audrie Gallus 07/20/2023, 11:47 AM

## 2023-07-20 NOTE — Progress Notes (Signed)
   07/20/23 1515  Spiritual Encounters  Type of Visit Follow up  Care provided to: Pt and family (Daughter, Alcario Drought) at bedside)  Orlene Erm partners present during Programmer, systems  Referral source Chaplain team  Reason for visit Routine spiritual support  OnCall Visit No  Spiritual Framework  Presenting Themes Meaning/purpose/sources of inspiration;Goals in Firefighter;Impactful experiences and emotions;Courage hope and growth  Interventions  Spiritual Care Interventions Made Compassionate presence;Reflective listening;Normalization of emotions;Meaning making;Encouragement  Intervention Outcomes  Outcomes Connection to spiritual care;Awareness of support

## 2023-07-21 ENCOUNTER — Inpatient Hospital Stay: Payer: MEDICAID

## 2023-07-21 DIAGNOSIS — N179 Acute kidney failure, unspecified: Secondary | ICD-10-CM | POA: Insufficient documentation

## 2023-07-21 DIAGNOSIS — T81321S Disruption or dehiscence of closure of internal operation (surgical) wound of abdominal wall muscle or fascia, sequela: Secondary | ICD-10-CM | POA: Diagnosis not present

## 2023-07-21 DIAGNOSIS — F1011 Alcohol abuse, in remission: Secondary | ICD-10-CM | POA: Insufficient documentation

## 2023-07-21 DIAGNOSIS — J9601 Acute respiratory failure with hypoxia: Secondary | ICD-10-CM | POA: Insufficient documentation

## 2023-07-21 DIAGNOSIS — R509 Fever, unspecified: Secondary | ICD-10-CM

## 2023-07-21 DIAGNOSIS — A419 Sepsis, unspecified organism: Secondary | ICD-10-CM | POA: Diagnosis not present

## 2023-07-21 DIAGNOSIS — D696 Thrombocytopenia, unspecified: Secondary | ICD-10-CM | POA: Insufficient documentation

## 2023-07-21 LAB — BASIC METABOLIC PANEL
Anion gap: 3 — ABNORMAL LOW (ref 5–15)
BUN: 32 mg/dL — ABNORMAL HIGH (ref 6–20)
CO2: 26 mmol/L (ref 22–32)
Calcium: 8.4 mg/dL — ABNORMAL LOW (ref 8.9–10.3)
Chloride: 109 mmol/L (ref 98–111)
Creatinine, Ser: 0.93 mg/dL (ref 0.61–1.24)
GFR, Estimated: >60 mL/min (ref >60)
Glucose, Bld: 106 mg/dL — ABNORMAL HIGH (ref 70–99)
Potassium: 4.4 mmol/L (ref 3.5–5.1)
Sodium: 138 mmol/L (ref 135–145)

## 2023-07-21 LAB — URINALYSIS, W/ REFLEX TO CULTURE (INFECTION SUSPECTED)
Bilirubin Urine: NEGATIVE
Glucose, UA: NEGATIVE mg/dL
Ketones, ur: NEGATIVE mg/dL
Nitrite: NEGATIVE
Protein, ur: 30 mg/dL — AB
Specific Gravity, Urine: 1.026 (ref 1.005–1.030)
pH: 5 (ref 5.0–8.0)

## 2023-07-21 LAB — CBC
HCT: 35.9 % — ABNORMAL LOW (ref 39.0–52.0)
Hemoglobin: 12.1 g/dL — ABNORMAL LOW (ref 13.0–17.0)
MCH: 34.6 pg — ABNORMAL HIGH (ref 26.0–34.0)
MCHC: 33.7 g/dL (ref 30.0–36.0)
MCV: 102.6 fL — ABNORMAL HIGH (ref 80.0–100.0)
Platelets: 100 10*3/uL — ABNORMAL LOW (ref 150–400)
RBC: 3.5 MIL/uL — ABNORMAL LOW (ref 4.22–5.81)
RDW: 14.6 % (ref 11.5–15.5)
WBC: 22.4 10*3/uL — ABNORMAL HIGH (ref 4.0–10.5)
nRBC: 0 % (ref 0.0–0.2)

## 2023-07-21 LAB — GLUCOSE, CAPILLARY
Glucose-Capillary: 112 mg/dL — ABNORMAL HIGH (ref 70–99)
Glucose-Capillary: 116 mg/dL — ABNORMAL HIGH (ref 70–99)
Glucose-Capillary: 123 mg/dL — ABNORMAL HIGH (ref 70–99)
Glucose-Capillary: 89 mg/dL (ref 70–99)

## 2023-07-21 LAB — TRIGLYCERIDES: Triglycerides: 78 mg/dL (ref <150)

## 2023-07-21 LAB — MAGNESIUM: Magnesium: 2.3 mg/dL (ref 1.7–2.4)

## 2023-07-21 LAB — PHOSPHORUS: Phosphorus: 2.8 mg/dL (ref 2.5–4.6)

## 2023-07-21 MED ORDER — FUROSEMIDE 10 MG/ML IJ SOLN
60.0000 mg | Freq: Once | INTRAMUSCULAR | Status: AC
  Administered 2023-07-21: 60 mg via INTRAVENOUS
  Filled 2023-07-21: qty 6

## 2023-07-21 MED ORDER — VANCOMYCIN HCL 1500 MG/300ML IV SOLN
1500.0000 mg | Freq: Once | INTRAVENOUS | Status: AC
  Administered 2023-07-21: 1500 mg via INTRAVENOUS
  Filled 2023-07-21: qty 300

## 2023-07-21 MED ORDER — IPRATROPIUM-ALBUTEROL 0.5-2.5 (3) MG/3ML IN SOLN
3.0000 mL | Freq: Four times a day (QID) | RESPIRATORY_TRACT | Status: DC
  Administered 2023-07-21 – 2023-07-23 (×7): 3 mL via RESPIRATORY_TRACT
  Filled 2023-07-21 (×6): qty 3

## 2023-07-21 MED ORDER — ACETAMINOPHEN 325 MG PO TABS
650.0000 mg | ORAL_TABLET | Freq: Four times a day (QID) | ORAL | Status: DC | PRN
  Administered 2023-07-21: 650 mg via ORAL
  Filled 2023-07-21: qty 2

## 2023-07-21 MED ORDER — DEXTROSE 70 % IV SOLN
INTRAVENOUS | Status: AC
  Filled 2023-07-21: qty 1440

## 2023-07-21 MED ORDER — VANCOMYCIN HCL IN DEXTROSE 1-5 GM/200ML-% IV SOLN
1000.0000 mg | Freq: Once | INTRAVENOUS | Status: AC
  Administered 2023-07-21: 1000 mg via INTRAVENOUS
  Filled 2023-07-21: qty 200

## 2023-07-21 MED ORDER — VANCOMYCIN HCL 2000 MG/400ML IV SOLN
2000.0000 mg | Freq: Two times a day (BID) | INTRAVENOUS | Status: DC
  Administered 2023-07-22: 2000 mg via INTRAVENOUS
  Filled 2023-07-21 (×2): qty 400

## 2023-07-21 NOTE — Progress Notes (Signed)
 Subjective:  CC: [name] is a 58 y.o. male  Hospital stay day 8, 4 Days Post-Op evisercation and retention suture placement  HPI: Fever reported earlier in the day.  Blood cultures chest x-ray is obtained.  Patient has no specific complaint.  Continues to pass flatus per verbal report and NG output has become thinner with ice chips  ROS:  General: Denies weight loss, weight gain, fatigue, fevers, chills, and night sweats. Heart: Denies chest pain, palpitations, racing heart, irregular heartbeat, leg pain or swelling, and decreased activity tolerance. Respiratory: Denies breathing difficulty, shortness of breath, wheezing, cough, and sputum. GI: Denies change in appetite, heartburn, nausea, vomiting, constipation, diarrhea, and blood in stool. GU: Denies difficulty urinating, pain with urinating, urgency, frequency, blood in urine.   Objective:   Temp:  [98.4 F (36.9 C)-101.3 F (38.5 C)] 98.9 F (37.2 C) (03/26 1438) Pulse Rate:  [87-116] 87 (03/26 1438) Resp:  [11-24] 13 (03/26 1438) BP: (122-157)/(46-96) 140/77 (03/26 1100) SpO2:  [91 %-100 %] 91 % (03/26 1438) Weight:  [174.3 kg] 174.3 kg (03/26 0500)     Height: 6\' 2"  (188 cm) Weight: (!) 174.3 kg BMI (Calculated): 49.32   Intake/Output this shift:   Intake/Output Summary (Last 24 hours) at 07/21/2023 1514 Last data filed at 07/21/2023 1407 Gross per 24 hour  Intake 3132.74 ml  Output 4800 ml  Net -1667.26 ml    Constitutional :  alert, cooperative, appears stated age, and mild distress  Respiratory:  clear to auscultation bilaterally  Cardiovascular:  Tachy rate  Gastrointestinal: soft, non-tender; bowel sounds normal; no masses,  no organomegaly. JP with serosanguinous  Skin: Cool and moist. Retention sutures intact  Psychiatric: Normal affect, non-agitated, not confused       LABS:     Latest Ref Rng & Units 07/21/2023    4:22 AM 07/20/2023    5:10 AM 07/19/2023    5:25 AM  CMP  Glucose 70 - 99 mg/dL 161   096  045   BUN 6 - 20 mg/dL 32  28  35   Creatinine 0.61 - 1.24 mg/dL 4.09  8.11  9.14   Sodium 135 - 145 mmol/L 138  135  137   Potassium 3.5 - 5.1 mmol/L 4.4  4.5  4.5   Chloride 98 - 111 mmol/L 109  105  102   CO2 22 - 32 mmol/L 26  25  23    Calcium 8.9 - 10.3 mg/dL 8.4  8.4  8.3   Total Protein 6.5 - 8.1 g/dL   5.6   Total Bilirubin 0.0 - 1.2 mg/dL   2.0   Alkaline Phos 38 - 126 U/L   54   AST 15 - 41 U/L   39   ALT 0 - 44 U/L   20       Latest Ref Rng & Units 07/21/2023    4:22 AM 07/20/2023    5:10 AM 07/19/2023    5:25 AM  CBC  WBC 4.0 - 10.5 K/uL 22.4  29.0  22.1   Hemoglobin 13.0 - 17.0 g/dL 78.2  95.6  21.3   Hematocrit 39.0 - 52.0 % 35.9  36.7  37.8   Platelets 150 - 400 K/uL 100  106  111     RADS: N/a Assessment:   S/p exlap, subsequent evisceration, retention suture placement.  Continues to pass flatus with ice chips.  Will attempt NG clamp trial for the rest of the day to see how he progresses.  Further fever workup and supportive care from a respiratory standpoint per primary team.  labs/images/medications/previous chart entries reviewed personally and relevant changes/updates noted above.

## 2023-07-21 NOTE — Assessment & Plan Note (Addendum)
 Platelet count recovered to 161.

## 2023-07-21 NOTE — Assessment & Plan Note (Addendum)
 Secondary to abdominal wound dehiscence.  Patient on meropenem and completed Diflucan on 3/29.

## 2023-07-21 NOTE — Progress Notes (Signed)
 PHARMACY - TOTAL PARENTERAL NUTRITION CONSULT NOTE   Indication: Prolonged ileus  Patient Measurements: Height: 6\' 2"  (188 cm) Weight: (!) 174.3 kg (384 lb 4.2 oz) IBW/kg (Calculated) : 82.2 TPN AdjBW (KG): 101.3 Body mass index is 49.34 kg/m.  Assessment:  58 y.o. male w/ PMH of morbid obesity, mild intermittent asthma, right inguinal hernia, GERD, OSA, migraines admitted on 07/13/2023 with a ventral hernia.   Glucose / Insulin: BG 106 - 112 /  no SSI required Electrolytes: slight hyponatremia Renal: SCr 0.94>1.69>1.21>0.92 Hepatic: bilirubin elevated, stable / INR 1.3 --LFTs wnl --PLT low at baseine, stable Intake / Output; MIVF: net -4.9L GI Imaging: 03/22 abd Xray Dilatation of small bowel in the mid to lower abdomen, Mild to moderate fecal stasis   GI Surgeries / Procedures:  03/22 Exploratory Laparotomy, abdominal washout, abdominal closure   Nutritional Goals: Goal TPN rate is 120 mL/hr (provides 144 g of protein and 2718 kcals per day)  RD Assessment: Estimated Needs Total Energy Estimated Needs: 2700-3000kcal/day Total Protein Estimated Needs: 135-150g/day Total Fluid Estimated Needs: 2.7-3.0L/day   Central access: 07/16/23 TPN start date: 07/17/23  Current Nutrition:  NPO  Plan:  ---Continue compounded TPN today at goal rate of 120 ml/hr at 1800 ---Electrolytes in TPN: Na 83mEq/L, K 32mEq/L, Ca 74mEq/L, Mg 75mEq/L, and Phos 31mmol/L. Cl:Ac 1:1 ---Add standard MVI and trace elements to TPN ---thiamine 100mg  and folic acid 1mg  being supplemented outside of TPN ---continue Sensitive q6h SSI and adjust as needed  ---Monitor TPN labs on Mon/Thurs, daily until stable  Iantha Titsworth A Kaiven Vester 07/21/2023,12:59 PM

## 2023-07-21 NOTE — Assessment & Plan Note (Signed)
 Initial surgery.

## 2023-07-21 NOTE — Plan of Care (Signed)

## 2023-07-21 NOTE — Assessment & Plan Note (Addendum)
 Continue antibiotics.  Continue TPN.  Patient with NG tube and clear liquid diet.  Surgical team following and to decide on NG tube and diet.

## 2023-07-21 NOTE — Assessment & Plan Note (Deleted)
 BMI 49.34 with current height and weight in computer.

## 2023-07-21 NOTE — Consult Note (Signed)
 Pharmacy Antibiotic Note  [name] is a 58 y.o. male admitted on 07/13/2023 with pneumonia.  Pharmacy has been consulted for vancomycin dosing.  Patient currently on Fluconazole and meropenem but continues to spike fevers.  WBC = 22.4, Tmax = 101.3  Plan: Give Vancomycin 2500 mg IV x 1, then start 2000 mg IV every 12 hours Estimated AUC 504, Cmin 13.9 IBW, Scr 0.93, Vd 0.5 (BMI 49) Vancomycin levels at steady state or as clinically indicated Continue meropenem 1 gram IV every 8 hours per provider Continue fluconazole 400 mg IV every 24 hours per provider Follow renal function and cultures for adjustments  Height: 6\' 2"  (188 cm) Weight: (!) 174.3 kg (384 lb 4.2 oz) IBW/kg (Calculated) : 82.2  Temp (24hrs), Avg:99.2 F (37.3 C), Min:98.4 F (36.9 C), Max:101.3 F (38.5 C)  Recent Labs  Lab 07/17/23 0242 07/18/23 0553 07/18/23 0554 07/18/23 1344 07/19/23 0525 07/19/23 0809 07/19/23 1350 07/19/23 2016 07/20/23 0208 07/20/23 0510 07/20/23 0833 07/21/23 0422  WBC 6.7  --  17.2*  --  22.1*  --   --   --   --  29.0*  --  22.4*  CREATININE 0.94 1.69*  --   --  1.21  --   --   --   --  0.92  --  0.93  LATICACIDVEN  --   --   --    < >  --  1.9 3.4* 1.5 1.5  --  1.6  --    < > = values in this interval not displayed.    Estimated Creatinine Clearance: 151.1 mL/min (by C-G formula based on SCr of 0.93 mg/dL).    Allergies  Allergen Reactions   Sulfa Antibiotics Anaphylaxis and Hives    Antimicrobials this admission: Vancomycin 3/26 >> Meropenem 3/25 >>  Zosyn 3/18 >> 3/25 Fluconazole 3/25 >>  Dose adjustments this admission: N/A  Microbiology results: 3/26 BCx: pending 3/18 BCx: No growth (final) 3/18 UCx: No growth (final)  3/`9 MRSA PCR: not detected  Thank you for allowing pharmacy to be a part of this patient's care.  Barrie Folk, PharmD 07/21/2023 6:47 PM

## 2023-07-21 NOTE — Progress Notes (Signed)
 Progress Note   Patient: [name] VHQ:[id number] DOB: 1966/02/12 DOA: 07/13/2023     8 DOS: the patient was seen and examined on 07/21/2023   Brief hospital course: 58 yo M with hx of obesity , OSA, , EtOH abuse hx of admission to hospital for ETOH withdrawal. He came in for elective surgery to have inguinal hernia repair with lysis of adhesions. He had significant bleeding per surgeon which was unusual and required electrocautery and ligature for hemostasis. He was brought back with abdominal JP drain and had FFP and platelets ordered due to intraoperative bleeding. After receiving these blood products patient deteriorated and developed circulatory shock, hypoxemia, swelling worse on lower extremities. He had code RRT called and was emergently brought to ICU due to shock with hypoxemia. He was treated for anaphylactic non febrile blood product reaction vs TRALI and improved overnight. He became stable and was downgraded to medical floor.   3/18 patient had bilateral inguinal hernia repair plus lysis of adhesions and exploratory laparotomy. 3/22.  Patient was brought to the operating room by Dr. Aleen Campi for fascial dehiscence with small bowel evisceration and handout exploratory laparotomy with abdominal washout and abdominal closure with retention sutures.  07/17/23- patient had code RRT overnight, he had findings of small bowel evisceration secondary to fascial dehiscence.  This likely stemmed from forceful cough per surgery service.  He became unstable in distress and required endotracheal intubation.  He was taken to OR emergently for ex-lap and closure of abdominal cavity.  Daughter was available to come in and we reviewed medical plan. CVl placed   07/18/23- extubated but reports severe pain. Daughter at bedside and patient is improved able to speak in full sentences.  Reviewed case with surgery and placed abdominal binder.  He is on CIWA due to reported daily drinking.    3/24 lethargic but  arousable, remains  on pressors   3/25 remains on pressors, foley in place.  Central line removed and PICC line placed  3/26.  Medical team took over.  Foley catheter removed.  Patient spiked a fever of 101.  Blood cultures, chest x-ray, urine analysis and urine culture (after Foley removed).  Patient already on fluconazole and meropenem.    Assessment and Plan: * Fever New fever today.  Central line removed yesterday and PICC line placed yesterday.  Foley catheter removed today.  Will send off urine analysis and urine culture after urine catheter removed.  Repeat blood cultures.  Chest x-ray.  Continue Diflucan and meropenem.  Septic shock (HCC) Secondary to abdominal wound dehiscence.  Perioperative dehiscence of abdominal wound with evisceration With new fever continue to monitor.  Continue antibiotics.  TPN to be restarted.  Patient with NG tube and ice chips today.  Surgical team following.  AKI (acute kidney injury) (HCC) Resolved.  Creatinine 1.29 on 3/19.  Last creatinine 0.93.  History of alcohol abuse On thiamine and folic acid  Acute respiratory failure with hypoxia and hypercapnia (HCC) Patient on 4 L of oxygen.  Required intubation and extubation during the hospital course.  Obesity, Class III, BMI 40-49.9 (morbid obesity) (HCC) BMI 49.34 with current height and weight in computer.  Thrombocytopenia (HCC) Continue to monitor with infection.  Inguinal hernia Initial surgery.        Subjective: Patient seen this morning and was feeling okay.  Spiked a fever this afternoon and was not feeling as well.  Admitted 8 days ago for bilateral inguinal hernia and patient also had exploratory laparotomy and lysis  of adhesions.  Physical Exam: Vitals:   07/21/23 1000 07/21/23 1100 07/21/23 1300 07/21/23 1438  BP: 132/78 (!) 140/77    Pulse: (!) 106 99  87  Resp: 15 15  13   Temp:   (!) 101.3 F (38.5 C) 98.9 F (37.2 C)  TempSrc:   Axillary Axillary  SpO2: 100% 94%   91%  Weight:      Height:       Physical Exam HENT:     Head: Normocephalic.     Mouth/Throat:     Pharynx: No oropharyngeal exudate.  Eyes:     General: Lids are normal.     Conjunctiva/sclera: Conjunctivae normal.  Cardiovascular:     Rate and Rhythm: Normal rate and regular rhythm.     Heart sounds: Normal heart sounds, S1 normal and S2 normal.  Pulmonary:     Breath sounds: Examination of the right-lower field reveals decreased breath sounds. Examination of the left-lower field reveals decreased breath sounds. Decreased breath sounds present. No wheezing, rhonchi or rales.  Abdominal:     General: Bowel sounds are decreased.     Palpations: Abdomen is soft.     Tenderness: There is abdominal tenderness.  Musculoskeletal:     Right lower leg: No swelling.     Left lower leg: No swelling.  Skin:    General: Skin is warm.     Findings: No rash.     Comments: Chronic brownish discoloration lower extremities.  Neurological:     Mental Status: He is alert.     Data Reviewed: Creatinine 0.93, white blood count 22.4, hemoglobin 12.1, platelet count 100, triglycerides 78  Family Communication: Spoke with daughter at the bedside  Disposition: Status is: Inpatient Remains inpatient appropriate because: This patient spiked a fever of 101 we will keep in stepdown unit  Planned Discharge Destination: To be determined    Time spent: 38 minutes  Author: Alford Highland, MD 07/21/2023 5:33 PM  For on call review [email or web address].

## 2023-07-21 NOTE — Assessment & Plan Note (Addendum)
 Resolved.  Creatinine 1.29 on 3/19.  Last creatinine 0.80.

## 2023-07-21 NOTE — Plan of Care (Addendum)
 Patient with audible expiratory wheezes. Patient encouraged to cough while splinting. Patient educated on splinting along with abdominal binder. The patient was encouraged to take deep breaths. Incentive Spirometer used. Patient able to accomplish 5 breaths at 1000. MD made aware. Order for Duonebs and lasix placed.    Problem: Activity: Goal: Risk for activity intolerance will decrease Outcome: Progressing   Problem: Nutrition: Goal: Adequate nutrition will be maintained Outcome: Progressing

## 2023-07-21 NOTE — Progress Notes (Signed)
 PT Cancellation Note  Patient Details Name: [name] MRN: [id number] DOB: 07-15-1965   Cancelled Treatment:    Reason Eval/Treat Not Completed: Medical issues which prohibited therapy Spoke with nursing about this pt both AM and PM. She originally intended to help him up to the recliner earlier today, PT planned to see this afternoon.  On arrival to unit pt having x-ray in room, per discussion with RN she was unable to get him up as he had spiked a fever and "just wasn't looking good at all."  She reports that she didn't feel good about trying to do much activity with PT or otherwise today; will maintain on caseload and attempt to see as appropriate going forward.  Malachi Pro, DPT 07/21/2023, 3:27 PM

## 2023-07-21 NOTE — Assessment & Plan Note (Signed)
 On thiamine and folic acid

## 2023-07-21 NOTE — Assessment & Plan Note (Addendum)
 Patient on 4 L of oxygen.  Required intubation and extubation during the hospital course.  Continue Lasix to twice daily dosing

## 2023-07-21 NOTE — Progress Notes (Signed)
   07/21/23 1430  Spiritual Encounters  Type of Visit Follow up  Care provided to: Pt and family (Daughter at bedside)  Conversation partners present during Programmer, systems  Referral source Chaplain assessment  Reason for visit Routine spiritual support  OnCall Visit No  Spiritual Framework  Presenting Themes Impactful experiences and emotions  Family Stress Factors Exhausted  Interventions  Spiritual Care Interventions Made Established relationship of care and support;Compassionate presence;Reflective listening  Intervention Outcomes  Outcomes Connection to spiritual care;Awareness around self/spiritual resourses;Awareness of support

## 2023-07-21 NOTE — Assessment & Plan Note (Addendum)
 On 3/26.  No recurrence.  Chest x-ray showing multifocal pneumonia.  Already on meropenem.  Central line removed 3/25 and PICC line placed 3/25.  Foley catheter removed 3/25.  Blood cultures are negative for 5 days.  White blood cell count trended down 12.6.  Procalcitonin 0.35.  Will stop meropenem today

## 2023-07-21 NOTE — Addendum Note (Signed)
 Addendum  created 07/21/23 0730 by Clydia Llano, CRNA   Attestation recorded in [place], Flowsheet accepted, Intraprocedure Attestations filed, Optician, dispensing edited

## 2023-07-21 NOTE — Progress Notes (Signed)
 This RN reviewed medications hanging on IV pole at the beginning of the shift. TPN was not infusing. TPN bag was not in typical yellow bag and was a combo bag with lipids. Reviewed orders briefly and seen a discontinue order. Did not restart TPN and it was disconnected. After later reviewed of orders found the correct order for TPN and learned that previous TPN order was the one that was discontinued and not current order. Informed Pharmacy to see if they could make new TPN bag since bag could no longer be attached back to patient. Pharmacy stated that TPN bags were made at main [facility] and he would be unable to make another bag. Informed NP Forbes Cellar and she stated to just prime again with new tubing. Informed her that was not policy and she ordered D 10 to be given at 75 ml/hr until TPN can be made, delivered and scanned. Patient still alert and oriented X4, tolerating advancement of diet to Ice chips well. Will continue to monitor.

## 2023-07-22 ENCOUNTER — Inpatient Hospital Stay: Payer: MEDICAID

## 2023-07-22 DIAGNOSIS — R509 Fever, unspecified: Secondary | ICD-10-CM | POA: Diagnosis not present

## 2023-07-22 DIAGNOSIS — N179 Acute kidney failure, unspecified: Secondary | ICD-10-CM | POA: Diagnosis not present

## 2023-07-22 DIAGNOSIS — T81321S Disruption or dehiscence of closure of internal operation (surgical) wound of abdominal wall muscle or fascia, sequela: Secondary | ICD-10-CM | POA: Diagnosis not present

## 2023-07-22 DIAGNOSIS — A419 Sepsis, unspecified organism: Secondary | ICD-10-CM | POA: Diagnosis not present

## 2023-07-22 LAB — COMPREHENSIVE METABOLIC PANEL WITH GFR
ALT: 16 U/L (ref 0–44)
AST: 29 U/L (ref 15–41)
Albumin: 1.8 g/dL — ABNORMAL LOW (ref 3.5–5.0)
Alkaline Phosphatase: 80 U/L (ref 38–126)
Anion gap: 7 (ref 5–15)
BUN: 28 mg/dL — ABNORMAL HIGH (ref 6–20)
CO2: 24 mmol/L (ref 22–32)
Calcium: 8.3 mg/dL — ABNORMAL LOW (ref 8.9–10.3)
Chloride: 106 mmol/L (ref 98–111)
Creatinine, Ser: 0.8 mg/dL (ref 0.61–1.24)
GFR, Estimated: >60 mL/min (ref >60)
Glucose, Bld: 138 mg/dL — ABNORMAL HIGH (ref 70–99)
Potassium: 4.2 mmol/L (ref 3.5–5.1)
Sodium: 137 mmol/L (ref 135–145)
Total Bilirubin: 1.4 mg/dL — ABNORMAL HIGH (ref 0.0–1.2)
Total Protein: 5.2 g/dL — ABNORMAL LOW (ref 6.5–8.1)

## 2023-07-22 LAB — GLUCOSE, CAPILLARY
Glucose-Capillary: 122 mg/dL — ABNORMAL HIGH (ref 70–99)
Glucose-Capillary: 128 mg/dL — ABNORMAL HIGH (ref 70–99)
Glucose-Capillary: 125 mg/dL — ABNORMAL HIGH (ref 70–99)

## 2023-07-22 LAB — MRSA NEXT GEN BY PCR, NASAL: MRSA by PCR Next Gen: NOT DETECTED

## 2023-07-22 MED ORDER — DIPHENHYDRAMINE HCL 50 MG/ML IJ SOLN: 12.5000 mg | Freq: Four times a day (QID) | INTRAMUSCULAR | Status: DC | PRN

## 2023-07-22 MED ORDER — TRAVASOL 10 % IV SOLN
INTRAVENOUS | Status: AC
  Filled 2023-07-22: qty 1440

## 2023-07-22 NOTE — Plan of Care (Signed)

## 2023-07-22 NOTE — Plan of Care (Signed)

## 2023-07-22 NOTE — Progress Notes (Signed)
 Subjective:  CC: [name] is a 58 y.o. male  Hospital stay day 9, 5 Days Post-Op evisercation and retention suture placement  HPI: No acute issues reported overnight.  NG remains clamped.  Patient has no specific complaints this morning  ROS:  General: Denies weight loss, weight gain, fatigue, fevers, chills, and night sweats. Heart: Denies chest pain, palpitations, racing heart, irregular heartbeat, leg pain or swelling, and decreased activity tolerance. Respiratory: Denies breathing difficulty, shortness of breath, wheezing, cough, and sputum. GI: Denies change in appetite, heartburn, nausea, vomiting, constipation, diarrhea, and blood in stool. GU: Denies difficulty urinating, pain with urinating, urgency, frequency, blood in urine.   Objective:   Temp:  [98.4 F (36.9 C)-101.3 F (38.5 C)] 98.5 F (36.9 C) (03/27 0400) Pulse Rate:  [87-108] 106 (03/27 0400) Resp:  [12-20] 17 (03/27 0400) BP: (127-147)/(70-96) 129/70 (03/27 0400) SpO2:  [91 %-100 %] 94 % (03/27 0400)     Height: 6\' 2"  (188 cm) Weight: (!) 174.3 kg BMI (Calculated): 49.32   Intake/Output this shift:   Intake/Output Summary (Last 24 hours) at 07/22/2023 0827 Last data filed at 07/21/2023 2200 Gross per 24 hour  Intake 1182.71 ml  Output 1440 ml  Net -257.29 ml    Constitutional :  alert, cooperative, appears stated age, and mild distress  Respiratory:  clear to auscultation bilaterally  Cardiovascular:  Tachy rate  Gastrointestinal: soft, non-tender; bowel sounds normal; no masses,  no organomegaly. JP with serosanguinous  Skin: Cool and moist. Retention sutures intact  Psychiatric: Normal affect, non-agitated, not confused       LABS:     Latest Ref Rng & Units 07/22/2023    6:16 AM 07/21/2023    4:22 AM 07/20/2023    5:10 AM  CMP  Glucose 70 - 99 mg/dL 130  865  784   BUN 6 - 20 mg/dL 28  32  28   Creatinine 0.61 - 1.24 mg/dL 6.96  2.95  2.84   Sodium 135 - 145 mmol/L 137  138  135    Potassium 3.5 - 5.1 mmol/L 4.2  4.4  4.5   Chloride 98 - 111 mmol/L 106  109  105   CO2 22 - 32 mmol/L 24  26  25    Calcium 8.9 - 10.3 mg/dL 8.3  8.4  8.4   Total Protein 6.5 - 8.1 g/dL 5.2     Total Bilirubin 0.0 - 1.2 mg/dL 1.4     Alkaline Phos 38 - 126 U/L 80     AST 15 - 41 U/L 29     ALT 0 - 44 U/L 16         Latest Ref Rng & Units 07/21/2023    4:22 AM 07/20/2023    5:10 AM 07/19/2023    5:25 AM  CBC  WBC 4.0 - 10.5 K/uL 22.4  29.0  22.1   Hemoglobin 13.0 - 17.0 g/dL 13.2  44.0  10.2   Hematocrit 39.0 - 52.0 % 35.9  36.7  37.8   Platelets 150 - 400 K/uL 100  106  111     RADS: N/a Assessment:   S/p exlap, subsequent evisceration, retention suture placement.  No report of bowel movements, KUB still has some loops of distended small bowel.  Will be conservative and continue NG clamp trial for 1 more day due to complicated hospital course so far.  If patient starts having a bowel movement then we can remove the NG.  labs/images/medications/previous chart entries  reviewed personally and relevant changes/updates noted above.

## 2023-07-22 NOTE — Progress Notes (Signed)
 PHARMACY - TOTAL PARENTERAL NUTRITION CONSULT NOTE   Indication: Prolonged ileus  Patient Measurements: Height: 6\' 2"  (188 cm) Weight: (!) 174.3 kg (384 lb 4.2 oz) IBW/kg (Calculated) : 82.2 TPN AdjBW (KG): 101.3 Body mass index is 49.34 kg/m.  Assessment:  58 y.o. male w/ PMH of morbid obesity, mild intermittent asthma, right inguinal hernia, GERD, OSA, migraines admitted on 07/13/2023 with a ventral hernia.   Glucose / Insulin: BG 89 - 138 /  no SSI required Electrolytes: slight hyponatremia Renal: SCr 0.94>1.69-->0.8 Hepatic: bilirubin elevated, stable / INR 1.3 --LFTs wnl --PLT low at baseine, stable Intake / Output; MIVF: net -5L GI Imaging: 03/22 abd Xray Dilatation of small bowel in the mid to lower abdomen, Mild to moderate fecal stasis   GI Surgeries / Procedures:  03/22 Exploratory Laparotomy, abdominal washout, abdominal closure   Nutritional Goals: Goal TPN rate is 120 mL/hr (provides 144 g of protein and 2718 kcals per day)  RD Assessment: Estimated Needs Total Energy Estimated Needs: 2700-3000kcal/day Total Protein Estimated Needs: 135-150g/day Total Fluid Estimated Needs: 2.7-3.0L/day   Central access: 07/16/23 TPN start date: 07/17/23  Current Nutrition:  NPO  Plan:  ---Continue compounded TPN today at goal rate of 120 ml/hr at 1800 ---Electrolytes in TPN: Na 50mEq/L, K 55mEq/L, Ca 19mEq/L, Mg 35mEq/L, and Phos 63mmol/L. Cl:Ac 1:1 ---Add standard MVI and trace elements to TPN ---thiamine 100mg  and folic acid 1mg  being supplemented outside of TPN ---continue Sensitive q6h SSI and adjust as needed  ---Monitor TPN labs on Mon/Thurs, daily until stable  Arham Symmonds A Dulcemaria Bula 07/22/2023,11:42 AM

## 2023-07-22 NOTE — Progress Notes (Signed)
 Progress Note   Patient: [name]:[id number] DOB: 1966/04/14 DOA: 07/13/2023     9 DOS: the patient was seen and examined on 07/22/2023   Brief hospital course: 58 yo M with hx of obesity , OSA, , EtOH abuse hx of admission to hospital for ETOH withdrawal. He came in for elective surgery to have inguinal hernia repair with lysis of adhesions. He had significant bleeding per surgeon which was unusual and required electrocautery and ligature for hemostasis. He was brought back with abdominal JP drain and had FFP and platelets ordered due to intraoperative bleeding. After receiving these blood products patient deteriorated and developed circulatory shock, hypoxemia, swelling worse on lower extremities. He had code RRT called and was emergently brought to ICU due to shock with hypoxemia. He was treated for anaphylactic non febrile blood product reaction vs TRALI and improved overnight. He became stable and was downgraded to medical floor.   3/18 patient had bilateral inguinal hernia repair plus lysis of adhesions and exploratory laparotomy. 3/22.  Patient was brought to the operating room by Dr. Aleen Campi for fascial dehiscence with small bowel evisceration and handout exploratory laparotomy with abdominal washout and abdominal closure with retention sutures.  07/17/23- patient had code RRT overnight, he had findings of small bowel evisceration secondary to fascial dehiscence.  This likely stemmed from forceful cough per surgery service.  He became unstable in distress and required endotracheal intubation.  He was taken to OR emergently for ex-lap and closure of abdominal cavity.  Daughter was available to come in and we reviewed medical plan. CVl placed   07/18/23- extubated but reports severe pain. Daughter at bedside and patient is improved able to speak in full sentences.  Reviewed case with surgery and placed abdominal binder.  He is on CIWA due to reported daily drinking.    3/24 lethargic but  arousable, remains  on pressors   3/25 remains on pressors, foley in place.  Central line removed and PICC line placed  3/26.  Medical team took over.  Foley catheter removed.  Patient spiked a fever of 101.  Blood cultures, chest x-ray, urine analysis and urine culture (after Foley removed).  Patient already on fluconazole and meropenem. 3/27.  Since MRSA PCR came back negative vancomycin was discontinued.  NG tube came out and needed to be replaced.    Assessment and Plan: * Fever On 3/26.  No recurrence.  Chest x-ray showing pneumonia.  Already on meropenem.  Central line removed 3/25 and PICC line placed 3/25.  Foley catheter removed 3/25.  Blood cultures are negative for 24 hours.  Since MRSA PCR negative discontinued vancomycin.  Septic shock (HCC) Secondary to abdominal wound dehiscence.  Perioperative dehiscence of abdominal wound with evisceration Continue antibiotics.  Continue TPN.  Patient with NG tube and ice chips today.  NG tube replaced today.  Surgical team following.  AKI (acute kidney injury) (HCC) Resolved.  Creatinine 1.29 on 3/19.  Last creatinine 0.80.  History of alcohol abuse On thiamine and folic acid  Acute respiratory failure with hypoxia and hypercapnia (HCC) Patient on 4 L of oxygen.  Required intubation and extubation during the hospital course.  Obesity, Class III, BMI 40-49.9 (morbid obesity) (HCC) BMI 49.34 with current height and weight in computer.  Thrombocytopenia (HCC) Continue to monitor with infection.  Inguinal hernia Initial surgery.        Subjective: Patient feels okay.  Was sitting out in the chair.  Admitted 9 days ago after inguinal hernia repair.  Physical Exam: Vitals:   07/22/23 1300 07/22/23 1331 07/22/23 1400 07/22/23 1500  BP: (!) 145/65  137/80 125/77  Pulse: (!) 101  (!) 101 (!) 102  Resp: 16  (!) 22 19  Temp: 99.4 F (37.4 C)     TempSrc: Axillary     SpO2: 91% 93% 92% 94%  Weight:      Height:        Physical Exam HENT:     Head: Normocephalic.     Mouth/Throat:     Pharynx: No oropharyngeal exudate.  Eyes:     General: Lids are normal.     Conjunctiva/sclera: Conjunctivae normal.  Cardiovascular:     Rate and Rhythm: Normal rate and regular rhythm.     Heart sounds: Normal heart sounds, S1 normal and S2 normal.  Pulmonary:     Breath sounds: Examination of the right-lower field reveals decreased breath sounds. Examination of the left-lower field reveals decreased breath sounds. Decreased breath sounds present. No wheezing, rhonchi or rales.  Abdominal:     General: Bowel sounds are decreased.     Palpations: Abdomen is soft.     Tenderness: There is abdominal tenderness.  Musculoskeletal:     Right lower leg: No swelling.     Left lower leg: No swelling.  Skin:    General: Skin is warm.     Findings: No rash.     Comments: Chronic brownish discoloration lower extremities.  Neurological:     Mental Status: He is alert.     Data Reviewed: Chest x-ray on 326 shows interval development of airspace opacities either secondary to edema or diffuse pneumonia.   Abdominal x-ray shows NG tube in the stomach Creatinine 0.8, total bilirubin 1.4, albumin 1.8  Family Communication: Spoke with daughter at the bedside  Disposition: Status is: Inpatient Remains inpatient appropriate because: Patient on TPN, still with NG tube  Planned Discharge Destination: To be determined    Time spent: 28 minutes Case discussed with Dr. Tonna Boehringer  Author: Alford Highland, MD 07/22/2023 4:03 PM  For on call review [email or web address].

## 2023-07-23 DIAGNOSIS — T81321S Disruption or dehiscence of closure of internal operation (surgical) wound of abdominal wall muscle or fascia, sequela: Secondary | ICD-10-CM | POA: Diagnosis not present

## 2023-07-23 DIAGNOSIS — R509 Fever, unspecified: Secondary | ICD-10-CM | POA: Diagnosis not present

## 2023-07-23 DIAGNOSIS — N179 Acute kidney failure, unspecified: Secondary | ICD-10-CM | POA: Diagnosis not present

## 2023-07-23 DIAGNOSIS — A419 Sepsis, unspecified organism: Secondary | ICD-10-CM | POA: Diagnosis not present

## 2023-07-23 LAB — BASIC METABOLIC PANEL WITH GFR
Anion gap: 4 — ABNORMAL LOW (ref 5–15)
BUN: 25 mg/dL — ABNORMAL HIGH (ref 6–20)
CO2: 29 mmol/L (ref 22–32)
Calcium: 8.6 mg/dL — ABNORMAL LOW (ref 8.9–10.3)
Chloride: 105 mmol/L (ref 98–111)
Creatinine, Ser: 0.7 mg/dL (ref 0.61–1.24)
GFR, Estimated: >60 mL/min (ref >60)
Glucose, Bld: 129 mg/dL — ABNORMAL HIGH (ref 70–99)
Potassium: 4.6 mmol/L (ref 3.5–5.1)
Sodium: 138 mmol/L (ref 135–145)

## 2023-07-23 LAB — CBC
HCT: 35.6 % — ABNORMAL LOW (ref 39.0–52.0)
Hemoglobin: 11.9 g/dL — ABNORMAL LOW (ref 13.0–17.0)
MCH: 34.3 pg — ABNORMAL HIGH (ref 26.0–34.0)
MCHC: 33.4 g/dL (ref 30.0–36.0)
MCV: 102.6 fL — ABNORMAL HIGH (ref 80.0–100.0)
Platelets: 113 10*3/uL — ABNORMAL LOW (ref 150–400)
RBC: 3.47 MIL/uL — ABNORMAL LOW (ref 4.22–5.81)
RDW: 14.6 % (ref 11.5–15.5)
WBC: 19.6 10*3/uL — ABNORMAL HIGH (ref 4.0–10.5)
nRBC: 0 % (ref 0.0–0.2)

## 2023-07-23 LAB — GLUCOSE, CAPILLARY
Glucose-Capillary: 131 mg/dL — ABNORMAL HIGH (ref 70–99)
Glucose-Capillary: 120 mg/dL — ABNORMAL HIGH (ref 70–99)
Glucose-Capillary: 124 mg/dL — ABNORMAL HIGH (ref 70–99)
Glucose-Capillary: 116 mg/dL — ABNORMAL HIGH (ref 70–99)
Glucose-Capillary: 116 mg/dL — ABNORMAL HIGH (ref 70–99)

## 2023-07-23 LAB — MAGNESIUM: Magnesium: 2 mg/dL (ref 1.7–2.4)

## 2023-07-23 LAB — PHOSPHORUS: Phosphorus: 3.1 mg/dL (ref 2.5–4.6)

## 2023-07-23 MED ORDER — IPRATROPIUM-ALBUTEROL 0.5-2.5 (3) MG/3ML IN SOLN: 3.0000 mL | Freq: Two times a day (BID) | RESPIRATORY_TRACT | Status: DC

## 2023-07-23 MED ORDER — IPRATROPIUM-ALBUTEROL 0.5-2.5 (3) MG/3ML IN SOLN
RESPIRATORY_TRACT | Status: AC
Start: 2023-07-23 — End: 2023-07-23
  Administered 2023-07-23: 3 mL via RESPIRATORY_TRACT
  Filled 2023-07-23: qty 3

## 2023-07-23 MED ORDER — IPRATROPIUM-ALBUTEROL 0.5-2.5 (3) MG/3ML IN SOLN: 3.0000 mL | Freq: Four times a day (QID) | RESPIRATORY_TRACT | Status: DC

## 2023-07-23 MED ORDER — TRAVASOL 10 % IV SOLN
INTRAVENOUS | Status: DC
  Filled 2023-07-23: qty 1440

## 2023-07-23 MED ORDER — TRAVASOL 10 % IV SOLN
INTRAVENOUS | Status: AC
  Filled 2023-07-23: qty 1440

## 2023-07-23 MED ORDER — IPRATROPIUM-ALBUTEROL 0.5-2.5 (3) MG/3ML IN SOLN
3.0000 mL | Freq: Four times a day (QID) | RESPIRATORY_TRACT | Status: DC
  Administered 2023-07-23 – 2023-07-26 (×9): 3 mL via RESPIRATORY_TRACT
  Filled 2023-07-23 (×9): qty 3

## 2023-07-23 MED ORDER — IPRATROPIUM-ALBUTEROL 0.5-2.5 (3) MG/3ML IN SOLN: 3.0000 mL | RESPIRATORY_TRACT | Status: DC | PRN

## 2023-07-23 MED ORDER — FUROSEMIDE 10 MG/ML IJ SOLN
40.0000 mg | Freq: Once | INTRAMUSCULAR | Status: AC
  Administered 2023-07-23: 40 mg via INTRAVENOUS
  Filled 2023-07-23: qty 4

## 2023-07-23 NOTE — Progress Notes (Addendum)
 PT Cancellation Note  Patient Details Name: [name] MRN: [id number] DOB: 02-Nov-1965   Cancelled Treatment:    Reason Eval/Treat Not Completed: Patient at procedure or test/unavailable Patient in the process of moving to room 226. Not available to see. Will re-attempt tomorrow.  Malory Spurr 07/23/2023, 3:02 PM

## 2023-07-23 NOTE — TOC Progression Note (Signed)
 Transition of Care [facility]) - Progression Note    Patient Details  Name: LORIK GUO MRN: [id number] Date of Birth: [birth year]  [facility] CM/SW Contact  Garret Reddish, RN Phone Number: 07/23/2023, 10:50 AM  Clinical Narrative:    Chart reviewed.  Noted that patient is s/p exlap. Subsequent evisceration, retention suture placement.   Patient still with no reported BM.  KUB was ordered. Patient has NG tube still clamped and tolerating Ice chips.  Patient remains on TPN.    TOC will continue to follow for discharge planning.         Expected Discharge Plan and Services                                               Social Determinants of Health (SDOH) Interventions SDOH Screenings   Food Insecurity: No Food Insecurity (07/14/2023)  Housing: Low Risk  (07/14/2023)  Transportation Needs: No Transportation Needs (07/14/2023)  Utilities: Not At Risk (07/14/2023)  Tobacco Use: High Risk (07/13/2023)    Readmission Risk Interventions     No data to display

## 2023-07-23 NOTE — Progress Notes (Addendum)
 OT Cancellation Note  Patient Details Name: [name] MRN: [id number] DOB: [birth year]   Cancelled Treatment:    Reason Eval/Treat Not Completed: Other (comment) (attempted to see pt for OT re-evaluation, pt with nursing with nurse reporting he is currently moving to the floor. OT will follow up as able.Oleta Mouse, OTD OTR/L  07/23/23, 2:36 PM

## 2023-07-23 NOTE — Progress Notes (Signed)
 PHARMACY - TOTAL PARENTERAL NUTRITION CONSULT NOTE   Indication: Prolonged ileus  Patient Measurements: Height: 6\' 2"  (188 cm) Weight: (!) 174.3 kg (384 lb 4.2 oz) IBW/kg (Calculated) : 82.2 TPN AdjBW (KG): 101.3 Body mass index is 49.34 kg/m.  Assessment:  58 y.o. male w/ PMH of morbid obesity, mild intermittent asthma, right inguinal hernia, GERD, OSA, migraines admitted on 07/13/2023 with a ventral hernia.   Glucose / Insulin: BG 120 - 129 /  2 units SSI required Electrolytes: slight hyponatremia Renal: SCr 0.94>1.69-->0.7 Hepatic: bilirubin elevated, stable / INR 1.3 --LFTs wnl --PLT low at baseine, stable Intake / Output; MIVF: net -5L GI Imaging: 03/22 abd Xray Dilatation of small bowel in the mid to lower abdomen, Mild to moderate fecal stasis   GI Surgeries / Procedures:  03/22 Exploratory Laparotomy, abdominal washout, abdominal closure   Nutritional Goals: Goal TPN rate is 120 mL/hr (provides 144 g of protein and 2718 kcals per day)  RD Assessment: Estimated Needs Total Energy Estimated Needs: 2700-3000kcal/day Total Protein Estimated Needs: 135-150g/day Total Fluid Estimated Needs: 2.7-3.0L/day   Central access: 07/16/23 TPN start date: 07/17/23  Current Nutrition:  NPO  Plan:  ---Continue compounded TPN today at goal rate of 120 ml/hr at 1800 ---Electrolytes in TPN: Na 73mEq/L, K 65mEq/L, Ca 65mEq/L, Mg 73mEq/L, and Phos 42mmol/L. Cl:Ac 1:1 ---Add standard MVI and trace elements to TPN ---thiamine 100mg  and folic acid 1mg  being supplemented outside of TPN ---continue Sensitive q6h SSI and adjust as needed  ---Monitor TPN labs on Mon/Thurs, daily until stable  Jameya Pontiff A Sheza Strickland 07/23/2023,1:32 PM

## 2023-07-23 NOTE — Progress Notes (Signed)
 Progress Note   Patient: [name]:[id number] DOB: [birth year] DOA: 07/13/2023     10 DOS: the patient was seen and examined on 07/23/2023   Brief hospital course: 58 yo M with hx of obesity , OSA, , EtOH abuse hx of admission to hospital for ETOH withdrawal. He came in for elective surgery to have inguinal hernia repair with lysis of adhesions. He had significant bleeding per surgeon which was unusual and required electrocautery and ligature for hemostasis. He was brought back with abdominal JP drain and had FFP and platelets ordered due to intraoperative bleeding. After receiving these blood products patient deteriorated and developed circulatory shock, hypoxemia, swelling worse on lower extremities. He had code RRT called and was emergently brought to ICU due to shock with hypoxemia. He was treated for anaphylactic non febrile blood product reaction vs TRALI and improved overnight. He became stable and was downgraded to medical floor.   3/18 patient had bilateral inguinal hernia repair plus lysis of adhesions and exploratory laparotomy. 3/22.  Patient was brought to the operating room by Dr. Aleen Campi for fascial dehiscence with small bowel evisceration and handout exploratory laparotomy with abdominal washout and abdominal closure with retention sutures.  07/17/23- patient had code RRT overnight, he had findings of small bowel evisceration secondary to fascial dehiscence.  This likely stemmed from forceful cough per surgery service.  He became unstable in distress and required endotracheal intubation.  He was taken to OR emergently for ex-lap and closure of abdominal cavity.  Daughter was available to come in and we reviewed medical plan. CVl placed   07/18/23- extubated but reports severe pain. Daughter at bedside and patient is improved able to speak in full sentences.  Reviewed case with surgery and placed abdominal binder.  He is on CIWA due to reported daily drinking.    3/24 lethargic but  arousable, remains  on pressors   3/25 remains on pressors, foley in place.  Central line removed and PICC line placed  3/26.  Medical team took over.  Foley catheter removed.  Patient spiked a fever of 101.  Blood cultures, chest x-ray, urine analysis and urine culture (after Foley removed).  Patient already on fluconazole and meropenem. 3/27.  Since MRSA PCR came back negative vancomycin was discontinued.  NG tube came out and needed to be replaced. 3/28.  Patient started on clear liquid diet.  Still has NG tube.    Assessment and Plan: * Fever On 3/26.  No recurrence.  Chest x-ray showing pneumonia.  Already on meropenem.  Central line removed 3/25 and PICC line placed 3/25.  Foley catheter removed 3/25.  Blood cultures are negative for 24 hours.  Since MRSA PCR negative discontinued vancomycin.  Septic shock (HCC) Secondary to abdominal wound dehiscence.  Patient on meropenem and Diflucan.  Perioperative dehiscence of abdominal wound with evisceration Continue antibiotics.  Continue TPN.  Patient with NG tube and clear liquid diet.  Surgical team following.  AKI (acute kidney injury) (HCC) Resolved.  Creatinine 1.29 on 3/19.  Last creatinine 0.70.  History of alcohol abuse On thiamine and folic acid  Acute respiratory failure with hypoxia and hypercapnia (HCC) Patient on 4 L of oxygen.  Required intubation and extubation during the hospital course.  Will give another dose of Lasix today.  Continue nebulizer treatment.  Obesity, Class III, BMI 40-49.9 (morbid obesity) (HCC) BMI 49.34 with current height and weight in computer.  Thrombocytopenia (HCC) Platelet count 113.  Inguinal hernia Initial surgery.  Subjective: Patient happy that he is on clear liquid diet.  Will be happier once the NG tube is out.  Had bowel movement.  Physical Exam: Vitals:   07/23/23 1200 07/23/23 1400 07/23/23 1436 07/23/23 1500  BP: 129/60     Pulse: (!) 105 (!) 101 (!) 102 (!) 112   Resp: 16 17 20  (!) 21  Temp:      TempSrc:      SpO2: 90% 92% 90% (!) 89%  Weight:      Height:       Physical Exam HENT:     Head: Normocephalic.     Mouth/Throat:     Pharynx: No oropharyngeal exudate.  Eyes:     General: Lids are normal.     Conjunctiva/sclera: Conjunctivae normal.  Cardiovascular:     Rate and Rhythm: Normal rate and regular rhythm.     Heart sounds: Normal heart sounds, S1 normal and S2 normal.  Pulmonary:     Breath sounds: Examination of the right-lower field reveals decreased breath sounds. Examination of the left-lower field reveals decreased breath sounds. Decreased breath sounds present. No wheezing, rhonchi or rales.  Abdominal:     General: Bowel sounds are decreased.     Palpations: Abdomen is soft.     Tenderness: There is abdominal tenderness.  Musculoskeletal:     Right lower leg: No swelling.     Left lower leg: No swelling.  Skin:    General: Skin is warm.     Findings: No rash.     Comments: Chronic brownish discoloration lower extremities.  Neurological:     Mental Status: He is alert.     Data Reviewed: Creatinine 0.7, white blood cell count 19.6, hemoglobin 11.9, platelet count 113  Family Communication: Spoke with daughter at the bedside.  Disposition: Status is: Inpatient Remains inpatient appropriate because: Patient still on TPN and IV antibiotics.  Will give 1 dose of Lasix today.  Started on clear liquid diet.  Planned Discharge Destination: To be determined    Time spent: 28 minutes  Author: Alford Highland, MD 07/23/2023 3:14 PM  For on call review [email or web address].

## 2023-07-23 NOTE — Progress Notes (Signed)
 Subjective:  CC: [name] is a 58 y.o. male  Hospital stay day 10, 6 Days Post-Op evisercation and retention suture placement  HPI: No acute issues reported overnight.  Replaced NG earlier in the day remains clamped.  Patient has no specific complaints this morning. Still passing flatus but no BM  ROS:  General: Denies weight loss, weight gain, fatigue, fevers, chills, and night sweats. Heart: Denies chest pain, palpitations, racing heart, irregular heartbeat, leg pain or swelling, and decreased activity tolerance. Respiratory: Denies breathing difficulty, shortness of breath, wheezing, cough, and sputum. GI: Denies change in appetite, heartburn, nausea, vomiting, constipation, diarrhea, and blood in stool. GU: Denies difficulty urinating, pain with urinating, urgency, frequency, blood in urine.   Objective:   Temp:  [97.8 F (36.6 C)-99.8 F (37.7 C)] 98.9 F (37.2 C) (03/28 0401) Pulse Rate:  [94-120] 98 (03/28 0401) Resp:  [13-29] 16 (03/28 0401) BP: (125-145)/(65-81) 129/68 (03/28 0401) SpO2:  [72 %-95 %] 90 % (03/28 0401)     Height: 6\' 2"  (188 cm) Weight: (!) 174.3 kg BMI (Calculated): 49.32   Intake/Output this shift:   Intake/Output Summary (Last 24 hours) at 07/23/2023 0743 Last data filed at 07/23/2023 7829 Gross per 24 hour  Intake --  Output 1635 ml  Net -1635 ml    Constitutional :  alert, cooperative, appears stated age, and mild distress  Respiratory:  clear to auscultation bilaterally  Cardiovascular:  Tachy rate  Gastrointestinal: soft, non-tender; bowel sounds normal; no masses,  no organomegaly. JP with serosanguinous output  Skin: Cool and moist. Retention sutures intact  Psychiatric: Normal affect, non-agitated, not confused       LABS:     Latest Ref Rng & Units 07/22/2023    6:16 AM 07/21/2023    4:22 AM 07/20/2023    5:10 AM  CMP  Glucose 70 - 99 mg/dL 562  130  865   BUN 6 - 20 mg/dL 28  32  28   Creatinine 0.61 - 1.24 mg/dL 7.84  6.96   2.95   Sodium 135 - 145 mmol/L 137  138  135   Potassium 3.5 - 5.1 mmol/L 4.2  4.4  4.5   Chloride 98 - 111 mmol/L 106  109  105   CO2 22 - 32 mmol/L 24  26  25    Calcium 8.9 - 10.3 mg/dL 8.3  8.4  8.4   Total Protein 6.5 - 8.1 g/dL 5.2     Total Bilirubin 0.0 - 1.2 mg/dL 1.4     Alkaline Phos 38 - 126 U/L 80     AST 15 - 41 U/L 29     ALT 0 - 44 U/L 16         Latest Ref Rng & Units 07/21/2023    4:22 AM 07/20/2023    5:10 AM 07/19/2023    5:25 AM  CBC  WBC 4.0 - 10.5 K/uL 22.4  29.0  22.1   Hemoglobin 13.0 - 17.0 g/dL 28.4  13.2  44.0   Hematocrit 39.0 - 52.0 % 35.9  36.7  37.8   Platelets 150 - 400 K/uL 100  106  111     RADS: CLINICAL DATA:  Ileus.   EXAM: PORTABLE ABDOMEN - 1 VIEW   COMPARISON:  July 17, 2023.   FINDINGS: Stable small bowel dilatation is noted concerning for ileus or distal small bowel obstruction. No colonic dilatation is noted. Midline surgical staples are noted. Surgical drain is noted in the epigastric  region.   IMPRESSION: Small bowel dilatation is noted concerning for postoperative ileus or distal small bowel obstruction. Surgical drain is noted in epigastric region.     Electronically Signed   By: Lupita Raider M.D.   On: 07/22/2023 11:46   Assessment:   S/p exlap, subsequent evisceration, retention suture placement.  Still No report of bowel movements, KUB still has some loops of distended small bowel, despite two days of NG clamp tolerating with ice chips. JP output with slight uptick as well today.  Due to previous history of aspiration after ileus developing with minimal symptoms, will keep NG in place and start clears and continue to monitor. May need to consider CT over weekend if no BM in next day or two to look for intra-abdominal issues causing the lack of BM, although ileus secondary to his respiratory issues and post op recovery is still more likely at this time.   labs/images/medications/previous chart entries reviewed  personally and relevant changes/updates noted above.

## 2023-07-23 NOTE — Plan of Care (Signed)
 WBC improving but still exhibits elevated temperature. Started on liquids today. Episode of wheezing with activity, resolved after Neb tx. And Lasix. Oxygen remains at 4l [place]. Encouraged IS.  Again missed PT today due to wheezing with activity.

## 2023-07-24 DIAGNOSIS — R635 Abnormal weight gain: Secondary | ICD-10-CM | POA: Insufficient documentation

## 2023-07-24 DIAGNOSIS — T81321S Disruption or dehiscence of closure of internal operation (surgical) wound of abdominal wall muscle or fascia, sequela: Secondary | ICD-10-CM | POA: Diagnosis not present

## 2023-07-24 DIAGNOSIS — J9601 Acute respiratory failure with hypoxia: Secondary | ICD-10-CM | POA: Diagnosis not present

## 2023-07-24 DIAGNOSIS — Z6841 Body Mass Index (BMI) 40.0 and over, adult: Secondary | ICD-10-CM

## 2023-07-24 DIAGNOSIS — R509 Fever, unspecified: Secondary | ICD-10-CM | POA: Diagnosis not present

## 2023-07-24 DIAGNOSIS — A419 Sepsis, unspecified organism: Secondary | ICD-10-CM | POA: Diagnosis not present

## 2023-07-24 LAB — GLUCOSE, CAPILLARY
Glucose-Capillary: 112 mg/dL — ABNORMAL HIGH (ref 70–99)
Glucose-Capillary: 113 mg/dL — ABNORMAL HIGH (ref 70–99)
Glucose-Capillary: 118 mg/dL — ABNORMAL HIGH (ref 70–99)
Glucose-Capillary: 121 mg/dL — ABNORMAL HIGH (ref 70–99)

## 2023-07-24 MED ORDER — FUROSEMIDE 10 MG/ML IJ SOLN
60.0000 mg | Freq: Every day | INTRAMUSCULAR | Status: DC
  Administered 2023-07-24: 60 mg via INTRAVENOUS
  Filled 2023-07-24: qty 8

## 2023-07-24 MED ORDER — FUROSEMIDE 10 MG/ML IJ SOLN
40.0000 mg | Freq: Every day | INTRAMUSCULAR | Status: DC
Start: 2023-07-24 — End: 2023-07-24

## 2023-07-24 MED ORDER — TRAVASOL 10 % IV SOLN
INTRAVENOUS | Status: AC
  Filled 2023-07-24: qty 1440

## 2023-07-24 MED ORDER — HYDROCOD POLI-CHLORPHE POLI ER 10-8 MG/5ML PO SUER
10.0000 mL | Freq: Three times a day (TID) | ORAL | Status: DC
  Administered 2023-07-24 – 2023-08-05 (×36): 10 mL via ORAL
  Filled 2023-07-24 (×36): qty 10

## 2023-07-24 NOTE — Plan of Care (Signed)
  Problem: Education: Goal: Knowledge of General Education information will improve Description: Including pain rating scale, medication(s)/side effects and non-pharmacologic comfort measures Outcome: Progressing   Problem: Clinical Measurements: Goal: Will remain free from infection Outcome: Progressing   Problem: Clinical Measurements: Goal: Respiratory complications will improve Outcome: Progressing   Problem: Nutrition: Goal: Adequate nutrition will be maintained Outcome: Progressing   Problem: Elimination: Goal: Will not experience complications related to bowel motility Outcome: Progressing   Problem: Pain Managment: Goal: General experience of comfort will improve and/or be controlled Outcome: Progressing   Problem: Safety: Goal: Ability to remain free from injury will improve Outcome: Progressing   Problem: Skin Integrity: Goal: Risk for impaired skin integrity will decrease Outcome: Progressing   Problem: Fluid Volume: Goal: Ability to maintain a balanced intake and output will improve Outcome: Progressing   Problem: Nutritional: Goal: Maintenance of adequate nutrition will improve Outcome: Progressing   Problem: Skin Integrity: Goal: Risk for impaired skin integrity will decrease Outcome: Progressing

## 2023-07-24 NOTE — Progress Notes (Signed)
 OT Cancellation Note  Patient Details Name: [name] MRN: [id number] DOB: [birth year]   Cancelled Treatment:    Reason Eval/Treat Not Completed: Fatigue/lethargy limiting ability to participate. Attempted to see pt for OT re-eval; pt politely declines participation in eval this time stating he does not feel well and requests to rest. OT attempts to encourage participation and educate on importance of OOB mobility but pt continues to decline. OT will continue to follow and see as able.  Mishayla Sliwinski L. Ellissa Ayo, OTR/L  07/24/23, 3:23 PM

## 2023-07-24 NOTE — Progress Notes (Signed)
 PHARMACY - TOTAL PARENTERAL NUTRITION CONSULT NOTE   Indication: Prolonged ileus  Patient Measurements: Height: 6\' 2"  (188 cm) Weight: (!) 179.6 kg (395 lb 15.1 oz) IBW/kg (Calculated) : 82.2 TPN AdjBW (KG): 101.3 Body mass index is 50.84 kg/m.  Assessment:  58 y.o. male w/ PMH of morbid obesity, mild intermittent asthma, right inguinal hernia, GERD, OSA, migraines admitted on 07/13/2023 with a ventral hernia.   Glucose / Insulin: BG 112 - 116 /  0 units SSI required Electrolytes: slight hyponatremia Renal: SCr 0.94>1.69-->0.7 Hepatic: bilirubin elevated, stable / INR 1.3 --LFTs wnl --PLT low at baseine, stable Intake / Output; MIVF: net -6.9L GI Imaging: 03/22 abd Xray Dilatation of small bowel in the mid to lower abdomen, Mild to moderate fecal stasis   GI Surgeries / Procedures:  03/22 Exploratory Laparotomy, abdominal washout, abdominal closure   Nutritional Goals: Goal TPN rate is 120 mL/hr (provides 144 g of protein and 2718 kcals per day)  RD Assessment: Estimated Needs Total Energy Estimated Needs: 2700-3000kcal/day Total Protein Estimated Needs: 135-150g/day Total Fluid Estimated Needs: 2.7-3.0L/day   Central access: 07/16/23 TPN start date: 07/17/23  Current Nutrition:  NPO  Plan:  ---Continue compounded TPN today at goal rate of 120 ml/hr at 1800 ---Electrolytes in TPN: Na 80mEq/L, K 55mEq/L, Ca 50mEq/L, Mg 22mEq/L, and Phos 35mmol/L. Cl:Ac 1:1 ---Add standard MVI and trace elements to TPN ---thiamine 100mg  and folic acid 1mg  being supplemented outside of TPN ---continue Sensitive q6h SSI and adjust as needed  ---Monitor TPN labs on Mon/Thurs, daily until stable  Mega Kinkade A Jia Dottavio 07/24/2023,10:44 AM

## 2023-07-24 NOTE — Progress Notes (Signed)
 CC: POD # 7  evisercation and retention suture placement  Subjective: Feeling well, + flatus On CLD NG cannister w Gatorade from last night   Objective: Vital signs in last 24 hours: Temp:  [97.1 F (36.2 C)-99.8 F (37.7 C)] 98.3 F (36.8 C) (03/29 0848) Pulse Rate:  [102-117] 107 (03/29 0848) Resp:  [16-20] 16 (03/29 0848) BP: (128-161)/(74-82) 161/80 (03/29 0848) SpO2:  [89 %-96 %] 95 % (03/29 1000) Weight:  [179.6 kg] 179.6 kg (03/29 0500) Last BM Date : 07/13/23  Intake/Output from previous day: 03/28 0701 - 03/29 0700 In: 2171.5 [I.V.:1681.6; NG/GT:90; IV Piggyback:399.9] Out: 2535 [Urine:2350; Drains:185] Intake/Output this shift: Total I/O In: 240 [P.O.:120; I.V.:20; NG/GT:100] Out: 925 [Urine:700; Drains:225]  Physical exam:  Debilitated morbidly obese I removed NGT he was appreciative Abd: soft, retention sutures and wound intact w/o infection or peritonitis Drain serous  Lab Results: CBC  Recent Labs    07/23/23 0800  WBC 19.6*  HGB 11.9*  HCT 35.6*  PLT 113*   BMET Recent Labs    07/22/23 0616 07/23/23 0800  NA 137 138  K 4.2 4.6  CL 106 105  CO2 24 29  GLUCOSE 138* 129*  BUN 28* 25*  CREATININE 0.80 0.70  CALCIUM 8.3* 8.6*   PT/INR No results for input(s): "LABPROT", "INR" in the last 72 hours. ABG No results for input(s): "PHART", "HCO3" in the last 72 hours.  Invalid input(s): "PCO2", "PO2"  Studies/Results: No results found.  Anti-infectives: Anti-infectives (From admission, onward)    Start     Dose/Rate Route Frequency Ordered Stop   07/22/23 0800  vancomycin (VANCOREADY) IVPB 2000 mg/400 mL  Status:  Discontinued        2,000 mg 200 mL/hr over 120 Minutes Intravenous Every 12 hours 07/21/23 1856 07/22/23 1136   07/21/23 2145  vancomycin (VANCOCIN) IVPB 1000 mg/200 mL premix       Placed in "Followed by" Linked Group   1,000 mg 200 mL/hr over 60 Minutes Intravenous  Once 07/21/23 1845 07/21/23 2323   07/21/23 1930   vancomycin (VANCOREADY) IVPB 1500 mg/300 mL       Placed in "Followed by" Linked Group   1,500 mg 150 mL/hr over 120 Minutes Intravenous  Once 07/21/23 1845 07/21/23 2201   07/21/23 1200  fluconazole (DIFLUCAN) IVPB 400 mg       Placed in "Followed by" Linked Group   400 mg 100 mL/hr over 120 Minutes Intravenous Every 24 hours 07/20/23 1059 07/25/23 1159   07/20/23 1400  meropenem (MERREM) 1 g in sodium chloride 0.9 % 100 mL IVPB        1 g 200 mL/hr over 30 Minutes Intravenous Every 8 hours 07/20/23 1021 08/03/23 1359   07/20/23 1145  fluconazole (DIFLUCAN) IVPB 800 mg       Placed in "Followed by" Linked Group   800 mg 200 mL/hr over 120 Minutes Intravenous  Once 07/20/23 1059 07/20/23 1428   07/17/23 1400  piperacillin-tazobactam (ZOSYN) IVPB 3.375 g  Status:  Discontinued        3.375 g 12.5 mL/hr over 240 Minutes Intravenous Every 8 hours 07/17/23 0753 07/20/23 1021   07/17/23 0600  piperacillin-tazobactam (ZOSYN) IVPB 3.375 g        3.375 g 100 mL/hr over 30 Minutes Intravenous On call to O.R. 07/17/23 0349 07/17/23 0503   07/15/23 1045  amoxicillin-clavulanate (AUGMENTIN) 875-125 MG per tablet 1 tablet  Status:  Discontinued        1 tablet  Oral Every 12 hours 07/15/23 0955 07/17/23 0753   07/13/23 1830  piperacillin-tazobactam (ZOSYN) IVPB 3.375 g  Status:  Discontinued        3.375 g 12.5 mL/hr over 240 Minutes Intravenous Every 8 hours 07/13/23 1743 07/15/23 0955   07/13/23 1815  cefTRIAXone (ROCEPHIN) 1 g in sodium chloride 0.9 % 100 mL IVPB  Status:  Discontinued        1 g 200 mL/hr over 30 Minutes Intravenous Every 24 hours 07/13/23 1728 07/13/23 1729   07/13/23 0715  ceFAZolin (ANCEF) IVPB 3g/150 mL premix        3 g 300 mL/hr over 30 Minutes Intravenous On call to O.R. 07/13/23 0700 07/13/23 0843       Assessment/Plan:  Ileus resolving but we will take it slow Removed NG CLD for now Mobilize Continue TPN for now  Sterling Big, MD, FACS  07/24/2023

## 2023-07-24 NOTE — Plan of Care (Signed)

## 2023-07-24 NOTE — Assessment & Plan Note (Signed)
 Patient has had quite a bit of weight gain during the hospital course received quite a bit of fluids TPN and antibiotics.  Will give Lasix and albumin.  BMI down to 49.96 today.

## 2023-07-24 NOTE — Progress Notes (Signed)
 Progress Note   Patient: [name] XBJ:[id number] DOB: August 05, 1965 DOA: 07/13/2023     11 DOS: the patient was seen and examined on 07/24/2023   Brief hospital course: 58 yo M with hx of obesity , OSA, , EtOH abuse hx of admission to hospital for ETOH withdrawal. He came in for elective surgery to have inguinal hernia repair with lysis of adhesions. He had significant bleeding per surgeon which was unusual and required electrocautery and ligature for hemostasis. He was brought back with abdominal JP drain and had FFP and platelets ordered due to intraoperative bleeding. After receiving these blood products patient deteriorated and developed circulatory shock, hypoxemia, swelling worse on lower extremities. He had code RRT called and was emergently brought to ICU due to shock with hypoxemia. He was treated for anaphylactic non febrile blood product reaction vs TRALI and improved overnight. He became stable and was downgraded to medical floor.   3/18 patient had bilateral inguinal hernia repair plus lysis of adhesions and exploratory laparotomy. 3/22.  Patient was brought to the operating room by Dr. Aleen Campi for fascial dehiscence with small bowel evisceration and handout exploratory laparotomy with abdominal washout and abdominal closure with retention sutures.  07/17/23- patient had code RRT overnight, he had findings of small bowel evisceration secondary to fascial dehiscence.  This likely stemmed from forceful cough per surgery service.  He became unstable in distress and required endotracheal intubation.  He was taken to OR emergently for ex-lap and closure of abdominal cavity.  Daughter was available to come in and we reviewed medical plan. CVl placed   07/18/23- extubated but reports severe pain. Daughter at bedside and patient is improved able to speak in full sentences.  Reviewed case with surgery and placed abdominal binder.  He is on CIWA due to reported daily drinking.    3/24 lethargic but  arousable, remains  on pressors   3/25 remains on pressors, foley in place.  Central line removed and PICC line placed  3/26.  Medical team took over.  Foley catheter removed.  Patient spiked a fever of 101.  Blood cultures, chest x-ray, urine analysis and urine culture (after Foley removed).  Patient already on fluconazole and meropenem. 3/27.  Since MRSA PCR came back negative vancomycin was discontinued.  NG tube came out and needed to be replaced. 3/28.  Patient started on clear liquid diet.  Still has NG tube. 3/29.  Will continue IV Lasix on a daily basis with weight gain up to 395 pounds.  Patient states that he normally is around 350.    Assessment and Plan: * Fever On 3/26.  No recurrence.  Chest x-ray showing pneumonia.  Already on meropenem.  Central line removed 3/25 and PICC line placed 3/25.  Foley catheter removed 3/25.  Blood cultures are negative for 24 hours.  Since MRSA PCR negative discontinued vancomycin.  Septic shock (HCC) Secondary to abdominal wound dehiscence.  Patient on meropenem and Diflucan (last dose today).  Perioperative dehiscence of abdominal wound with evisceration Continue antibiotics.  Continue TPN.  Patient with NG tube and clear liquid diet.  Surgical team following and to decide on NG tube and diet.  Morbid obesity with BMI of 50.0-59.9, adult [facility]) Patient has had quite a bit of weight gain during the hospital course received quite a bit of fluids TPN and antibiotics.  Will give Lasix 60 mg daily on a daily basis to see if we can get weight down from where he has now.  Currently  395 pounds.  Patient states that he normally feels good around 350 pounds.  AKI (acute kidney injury) (HCC) Resolved.  Creatinine 1.29 on 3/19.  Last creatinine 0.70.  History of alcohol abuse On thiamine and folic acid  Acute respiratory failure with hypoxia and hypercapnia (HCC) Patient on 4 L of oxygen.  Required intubation and extubation during the hospital course.   Will give another dose of Lasix today.  Continue nebulizer treatment.  Thrombocytopenia (HCC) Platelet count 113.  Inguinal hernia Initial surgery.        Subjective: Patient feeling swollen and asking for Lasix.  Normally states his weight is around 350 when he is feeling good.  Current weight 395.  Concerned about rash on the back.  Advised we need to sit up and get some areas on his back.  Physical Exam: Vitals:   07/24/23 0430 07/24/23 0500 07/24/23 0848 07/24/23 1000  BP: (!) 142/78  (!) 161/80   Pulse: (!) 110  (!) 107   Resp: 16  16   Temp: 98.2 F (36.8 C)  98.3 F (36.8 C)   TempSrc: Oral     SpO2: 92%  (!) 89% 95%  Weight:  (!) 179.6 kg    Height:       Physical Exam HENT:     Head: Normocephalic.     Mouth/Throat:     Pharynx: No oropharyngeal exudate.  Eyes:     General: Lids are normal.     Conjunctiva/sclera: Conjunctivae normal.  Cardiovascular:     Rate and Rhythm: Normal rate and regular rhythm.     Heart sounds: Normal heart sounds, S1 normal and S2 normal.  Pulmonary:     Breath sounds: Examination of the right-lower field reveals decreased breath sounds. Examination of the left-lower field reveals decreased breath sounds. Decreased breath sounds present. No wheezing, rhonchi or rales.  Abdominal:     General: Bowel sounds are decreased.     Palpations: Abdomen is soft.     Tenderness: There is abdominal tenderness.  Musculoskeletal:     Right lower leg: No swelling.     Left lower leg: No swelling.  Skin:    General: Skin is warm.     Findings: No rash.     Comments: Chronic brownish discoloration lower extremities.  Neurological:     Mental Status: He is alert.     Data Reviewed: Last creatinine 0.7, last white blood count 19.6, hemoglobin 11.9  Family Communication: Daughter at bedside  Disposition: Status is: Inpatient Remains inpatient appropriate because: This morning still had NG tube and on clear liquid diet and TPN.  Will  start IV Lasix with weight gain.  Planned Discharge Destination: Skilled nursing facility    Time spent: 28 minutes  Author: Alford Highland, MD 07/24/2023 1:51 PM  For on call review [email or web address].

## 2023-07-25 ENCOUNTER — Other Ambulatory Visit: Payer: Self-pay

## 2023-07-25 ENCOUNTER — Inpatient Hospital Stay: Payer: MEDICAID

## 2023-07-25 DIAGNOSIS — E871 Hypo-osmolality and hyponatremia: Secondary | ICD-10-CM | POA: Insufficient documentation

## 2023-07-25 DIAGNOSIS — J9601 Acute respiratory failure with hypoxia: Secondary | ICD-10-CM | POA: Diagnosis not present

## 2023-07-25 DIAGNOSIS — T81321S Disruption or dehiscence of closure of internal operation (surgical) wound of abdominal wall muscle or fascia, sequela: Secondary | ICD-10-CM | POA: Diagnosis not present

## 2023-07-25 DIAGNOSIS — M7989 Other specified soft tissue disorders: Secondary | ICD-10-CM | POA: Insufficient documentation

## 2023-07-25 DIAGNOSIS — R509 Fever, unspecified: Secondary | ICD-10-CM | POA: Diagnosis not present

## 2023-07-25 DIAGNOSIS — E875 Hyperkalemia: Secondary | ICD-10-CM | POA: Insufficient documentation

## 2023-07-25 DIAGNOSIS — A419 Sepsis, unspecified organism: Secondary | ICD-10-CM | POA: Diagnosis not present

## 2023-07-25 LAB — PROTIME-INR
INR: 1.4 — ABNORMAL HIGH (ref 0.8–1.2)
Prothrombin Time: 17.8 s — ABNORMAL HIGH (ref 11.4–15.2)

## 2023-07-25 LAB — COMPREHENSIVE METABOLIC PANEL WITH GFR
ALT: 26 U/L (ref 0–44)
AST: 45 U/L — ABNORMAL HIGH (ref 15–41)
Albumin: 1.6 g/dL — ABNORMAL LOW (ref 3.5–5.0)
Alkaline Phosphatase: 73 U/L (ref 38–126)
Anion gap: 6 (ref 5–15)
BUN: 27 mg/dL — ABNORMAL HIGH (ref 6–20)
CO2: 26 mmol/L (ref 22–32)
Calcium: 8.5 mg/dL — ABNORMAL LOW (ref 8.9–10.3)
Chloride: 100 mmol/L (ref 98–111)
Creatinine, Ser: 0.8 mg/dL (ref 0.61–1.24)
GFR, Estimated: >60 mL/min (ref >60)
Glucose, Bld: 107 mg/dL — ABNORMAL HIGH (ref 70–99)
Potassium: 5.3 mmol/L — ABNORMAL HIGH (ref 3.5–5.1)
Sodium: 132 mmol/L — ABNORMAL LOW (ref 135–145)
Total Bilirubin: 1.6 mg/dL — ABNORMAL HIGH (ref 0.0–1.2)
Total Protein: 5.5 g/dL — ABNORMAL LOW (ref 6.5–8.1)

## 2023-07-25 LAB — CBC
HCT: 33 % — ABNORMAL LOW (ref 39.0–52.0)
Hemoglobin: 11.2 g/dL — ABNORMAL LOW (ref 13.0–17.0)
MCH: 34.4 pg — ABNORMAL HIGH (ref 26.0–34.0)
MCHC: 33.9 g/dL (ref 30.0–36.0)
MCV: 101.2 fL — ABNORMAL HIGH (ref 80.0–100.0)
Platelets: 128 10*3/uL — ABNORMAL LOW (ref 150–400)
RBC: 3.26 MIL/uL — ABNORMAL LOW (ref 4.22–5.81)
RDW: 14.5 % (ref 11.5–15.5)
WBC: 21 10*3/uL — ABNORMAL HIGH (ref 4.0–10.5)
nRBC: 0 % (ref 0.0–0.2)

## 2023-07-25 LAB — GLUCOSE, CAPILLARY
Glucose-Capillary: 113 mg/dL — ABNORMAL HIGH (ref 70–99)
Glucose-Capillary: 96 mg/dL (ref 70–99)
Glucose-Capillary: 78 mg/dL (ref 70–99)
Glucose-Capillary: 87 mg/dL (ref 70–99)

## 2023-07-25 MED ORDER — FUROSEMIDE 10 MG/ML IJ SOLN
40.0000 mg | Freq: Two times a day (BID) | INTRAMUSCULAR | Status: DC
  Administered 2023-07-25 – 2023-08-04 (×20): 40 mg via INTRAVENOUS
  Filled 2023-07-25 (×18): qty 4

## 2023-07-25 MED ORDER — ENOXAPARIN SODIUM 300 MG/3ML IJ SOLN
175.0000 mg | Freq: Two times a day (BID) | INTRAMUSCULAR | Status: DC
  Filled 2023-07-25: qty 1.75

## 2023-07-25 MED ORDER — FUROSEMIDE 10 MG/ML IJ SOLN
40.0000 mg | Freq: Two times a day (BID) | INTRAMUSCULAR | Status: DC
  Administered 2023-07-25: 40 mg via INTRAVENOUS
  Filled 2023-07-25 (×2): qty 4

## 2023-07-25 MED ORDER — TRAVASOL 10 % IV SOLN
INTRAVENOUS | Status: DC
  Filled 2023-07-25: qty 1440

## 2023-07-25 MED ORDER — ENOXAPARIN SODIUM 100 MG/ML IJ SOSY
175.0000 mg | PREFILLED_SYRINGE | Freq: Two times a day (BID) | INTRAMUSCULAR | Status: DC
  Administered 2023-07-25 – 2023-07-27 (×4): 175 mg via SUBCUTANEOUS
  Filled 2023-07-25 (×5): qty 2

## 2023-07-25 MED ORDER — ALBUMIN HUMAN 25 % IV SOLN
12.5000 g | Freq: Once | INTRAVENOUS | Status: AC
  Administered 2023-07-25: 12.5 g via INTRAVENOUS
  Filled 2023-07-25: qty 50

## 2023-07-25 MED ORDER — SODIUM CHLORIDE 0.9 % IV SOLN
1.0000 g | Freq: Three times a day (TID) | INTRAVENOUS | Status: DC
  Administered 2023-07-25 – 2023-07-27 (×5): 1 g via INTRAVENOUS
  Filled 2023-07-25 (×7): qty 20

## 2023-07-25 MED ORDER — SODIUM ZIRCONIUM CYCLOSILICATE 10 G PO PACK
10.0000 g | PACK | Freq: Once | ORAL | Status: AC
  Administered 2023-07-25: 10 g via ORAL
  Filled 2023-07-25: qty 1

## 2023-07-25 NOTE — Progress Notes (Addendum)
 Physical Therapy Treatment Patient Details Name: [name] MRN: [id number] DOB: [birth year] Today's Date: 07/25/2023   History of Present Illness Patient is a 58 year old male here for elective  inguinal hernia repair. Had bleeding  required electrocautery and ligature for hemostasis. After getting blood products, patient deteriorated, treated for shock and hypoxemia. Had rapid response with small bowel evisceration secondary to fascial dehiscence requiring surgery for evisercation and retention suture placement. Requiring pressor support.    PT Comments  Co-tx with OT for improved outcomes.  Pt able to get to EOB with min a x 2 and bed features. Steady in sitting.  He is able to stand with min a x 2 and remain standing for care of BM (small smear) before transitioning to chair at bedside.  Care not to pull PICC line but upon sitting in chair it is noted that dressing is adhering poorly and falling off and RN in to check.  Re-enforced with tape and RN to address.  UE does have some increased edema today which is more noticeable once in chair.  UE elevated and RN aware.  Pt likely could have increased gait distance today if not limited by PICC concerns.     If plan is discharge home, recommend the following: Assistance with cooking/housework;Assist for transportation;Help with stairs or ramp for entrance;Two people to help with walking and/or transfers;Two people to help with bathing/dressing/bathroom   Can travel by private vehicle        Equipment Recommendations  Hospital bed;Rollator (4 wheels);BSC/3in1    Recommendations for Other Services       Precautions / Restrictions Precautions Precautions: Fall Precaution/Restrictions Comments: watch picc line to make sure it is secure.  abdominal binder Restrictions Weight Bearing Restrictions Per Provider Order: No     Mobility  Bed Mobility Overal bed mobility: Needs Assistance Bed Mobility: Supine to Sit Rolling: Mod assist    Supine to sit: Min assist, HOB elevated, Used rails       Patient Response: Cooperative  Transfers Overall transfer level: Needs assistance Equipment used: Rolling walker (2 wheels) Transfers: Sit to/from Stand Sit to Stand: +2 physical assistance, Min assist           General transfer comment: +2 for safety and for lines    Ambulation/Gait Ambulation/Gait assistance: Contact guard assist Gait Distance (Feet): 3 Feet Assistive device: Rolling walker (2 wheels) Gait Pattern/deviations: Step-to pattern       General Gait Details: steps to recliner at bedside   Stairs             Wheelchair Mobility     Tilt Bed Tilt Bed Patient Response: Cooperative  Modified Rankin (Stroke Patients Only)       Balance Overall balance assessment: Needs assistance Sitting-balance support: Feet supported Sitting balance-Leahy Scale: Good     Standing balance support: Bilateral upper extremity supported Standing balance-Leahy Scale: Fair Standing balance comment: RW use and +2 for safety                            Communication Communication Communication: No apparent difficulties  Cognition Arousal: Alert Behavior During Therapy: WFL for tasks assessed/performed   PT - Cognitive impairments: No apparent impairments                         Following commands: Impaired Following commands impaired: Follows one step commands with increased time    Cueing Cueing  Techniques: Verbal cues, Visual cues, Tactile cues  Exercises      General Comments        Pertinent Vitals/Pain Pain Assessment Pain Assessment: Faces Faces Pain Scale: Hurts a little bit Pain Location: abdomen; Pain Descriptors / Indicators: Sore Pain Intervention(s): Limited activity within patient's tolerance, Monitored during session, Repositioned    Home Living                          Prior Function            PT Goals (current goals can now be found  in the care plan section) Progress towards PT goals: Progressing toward goals    Frequency    Min 2X/week      PT Plan      Co-evaluation PT/OT/SLP Co-Evaluation/Treatment: Yes Reason for Co-Treatment: For patient/therapist safety PT goals addressed during session: Mobility/safety with mobility OT goals addressed during session: ADL's and self-care      AM-PAC PT "6 Clicks" Mobility   Outcome Measure  Help needed turning from your back to your side while in a flat bed without using bedrails?: A Lot Help needed moving from lying on your back to sitting on the side of a flat bed without using bedrails?: A Lot Help needed moving to and from a bed to a chair (including a wheelchair)?: A Little Help needed standing up from a chair using your arms (e.g., wheelchair or bedside chair)?: A Little Help needed to walk in hospital room?: A Lot Help needed climbing 3-5 steps with a railing? : Total 6 Click Score: 13    End of Session Equipment Utilized During Treatment: Other (comment) (abdominal binder)   Patient left: in chair;with chair alarm set;with call bell/phone within reach;with family/visitor present Nurse Communication: Mobility status PT Visit Diagnosis: Unsteadiness on feet (R26.81);Difficulty in walking, not elsewhere classified (R26.2);Muscle weakness (generalized) (M62.81)     Time: 4098-1191 PT Time Calculation (min) (ACUTE ONLY): 24 min  Charges:    $Therapeutic Activity: 8-22 mins PT General Charges $$ ACUTE PT VISIT: 1 Visit                   [name], PTA 07/25/23, 2:12 PM

## 2023-07-25 NOTE — Assessment & Plan Note (Signed)
 Sodium went down to 129.  Patient still wants to continue with the Lasix to diurese fluid.

## 2023-07-25 NOTE — Assessment & Plan Note (Signed)
 [name]

## 2023-07-25 NOTE — Progress Notes (Signed)
 PICC placement order noted. Spoke with patient regarding risks, benefits, and alternatives. Patient wants a PIV and to wait for PICC/CL placement. Primary RN notified.

## 2023-07-25 NOTE — Evaluation (Signed)
 Occupational Therapy Evaluation Patient Details Name: [name] MRN: [id number] DOB: 1965/05/15 Today's Date: 07/25/2023   History of Present Illness   Patient is a 58 year old male here for elective  inguinal hernia repair. Had bleeding  required electrocautery and ligature for hemostasis. After getting blood products, patient deteriorated, treated for shock and hypoxemia. Had rapid response with small bowel evisceration secondary to fascial dehiscence requiring surgery for evisercation and retention suture placement. Requiring pressor support.     Clinical Impressions Pt was seen for OT re-evaluation this date with PT joining session for treatment. Prior to hospital admission, pt was residing at home with brother, sister in law and daughter. 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  Pt presents to acute OT demonstrating impaired ADL performance and functional mobility 2/2 weakness, pain, low activity tolerance, and balance deficits. Pt currently requires Min a X2 for log roll and sidelying to sit at EOB. Min A x2 for STS trials from EOB to RW with total assist needed for peri-care while standing. Minimal discomfort to abdomen during session. Rest breaks between all activity. SPT performed using RW to recliner with Min A x2 for safety with lines/leads management. Pt likely able to progress further with mobility, however nurse in to assess/secure IV line better. Pt would benefit from skilled OT services to address noted impairments and functional limitations (see below for any additional details) in order to maximize safety and independence while minimizing falls risk and caregiver burden. Do anticipate the need for follow up OT services upon acute hospital DC.      If plan is discharge home, recommend the following:   A lot of help with bathing/dressing/bathroom;Help with stairs or ramp for entrance;Assist for  transportation;Assistance with cooking/housework;A little help with walking and/or transfers     Functional Status Assessment   Patient has had a recent decline in their functional status and demonstrates the ability to make significant improvements in function in a reasonable and predictable amount of time.     Equipment Recommendations   BSC/3in1;Other (comment)     Recommendations for Other Services         Precautions/Restrictions   Precautions Precautions: Fall Precaution/Restrictions Comments: watch picc line to make sure it is secure.  abdominal binder Restrictions Weight Bearing Restrictions Per Provider Order: No     Mobility Bed Mobility Overal bed mobility: Needs Assistance Bed Mobility: Supine to Sit, Sidelying to Sit, Rolling Rolling: Mod assist Sidelying to sit: Min assist, +2 for physical assistance       General bed mobility comments: increased assist/cueing to roll to L side to prepare for sitting EOB with cueing for initiating leg movement and Min A x2 to bring trunk upright    Transfers Overall transfer level: Needs assistance Equipment used: Rolling walker (2 wheels) Transfers: Sit to/from Stand, Bed to chair/wheelchair/BSC Sit to Stand: +2 physical assistance, Min assist     Step pivot transfers: Min assist, +2 physical assistance     General transfer comment: +2 for safety d/t body habitus and for lines/leads management; HR up to 126 with minimal activity      Balance Overall balance assessment: Needs assistance Sitting-balance support: Feet supported Sitting balance-Leahy Scale: Good     Standing balance support: Bilateral upper extremity supported, Reliant on assistive device for balance Standing balance-Leahy Scale: Fair Standing balance comment: RW use and +2 for safety  ADL either performed or assessed with clinical judgement   ADL Overall ADL's : Needs assistance/impaired                          Toilet Transfer: Minimal assistance;Requires wide/bariatric;+2 for safety/equipment;+2 for physical assistance Toilet Transfer Details (indicate cue type and reason): simulated to recliner Toileting- Clothing Manipulation and Hygiene: Total assistance;Sit to/from stand Toileting - Clothing Manipulation Details (indicate cue type and reason): in standing with support on RW             Vision         Perception         Praxis         Pertinent Vitals/Pain Pain Assessment Pain Assessment: Faces Faces Pain Scale: Hurts a little bit Pain Location: abdomen; Pain Descriptors / Indicators: Sore Pain Intervention(s): Monitored during session, Repositioned     Extremity/Trunk Assessment Upper Extremity Assessment Upper Extremity Assessment: Generalized weakness   Lower Extremity Assessment Lower Extremity Assessment: Generalized weakness       Communication Communication Communication: No apparent difficulties   Cognition Arousal: Alert Behavior During Therapy: WFL for tasks assessed/performed                                 Following commands: Impaired Following commands impaired: Follows one step commands with increased time     Cueing  General Comments   Cueing Techniques: Verbal cues;Visual cues;Tactile cues  HR up to 126 with minimal activity; nurse in to assist IV line in RUE d/t looking like it may be close to coming out   Exercises Other Exercises Other Exercises: encouraged seated exercises to promote strength/activity tolerance while up in recliner with daughter present   Shoulder Instructions      Home Living Family/patient expects to be discharged to:: Private residence Living Arrangements: Children;Other relatives Available Help at Discharge: Family Type of Home: House Home Access: Stairs to enter [employer] of Steps: 6 STE steep, wide rails Entrance Stairs-Rails: Right;Left Home Layout: One  level     Bathroom Shower/Tub: Walk-in shower         Home Equipment: None   Additional Comments: no equipment of his own; aunt and uncle have lots of equipment. Pt reports stairs aren't very safe to navigate and need repairs. Working on getting a Medical illustrator for him to sleep in on DC home.      Prior Functioning/Environment Prior Level of Function : Independent/Modified Independent;Driving             Mobility Comments: IND community and household distances ADLs Comments: IND, driving, lifting 16# bags of dog food; caregiver to sister/brother; cousin and daughter also assist    OT Problem List: Decreased strength;Pain;Decreased activity tolerance   OT Treatment/Interventions: Self-care/ADL training;Therapeutic exercise;Therapeutic activities;Energy conservation;DME and/or AE instruction;Patient/family education;Balance training      OT Goals(Current goals can be found in the care plan section)   Acute Rehab OT Goals Patient Stated Goal: return home OT Goal Formulation: With patient/family Time For Goal Achievement: 08/08/23 Potential to Achieve Goals: Good ADL Goals Pt Will Perform Lower Body Dressing: with contact guard assist;with min assist;sit to/from stand;sitting/lateral leans Pt Will Perform Toileting - Clothing Manipulation and hygiene: with contact guard assist;sitting/lateral leans;sit to/from stand   OT Frequency:  Min 3X/week    Co-evaluation PT/OT/SLP Co-Evaluation/Treatment: Yes Reason for Co-Treatment: For patient/therapist safety PT goals addressed during session: Mobility/safety with  mobility OT goals addressed during session: ADL's and self-care      AM-PAC OT "6 Clicks" Daily Activity     Outcome Measure Help from another person eating meals?: None Help from another person taking care of personal grooming?: A Little Help from another person toileting, which includes using toliet, bedpan, or urinal?: A Lot Help from another person bathing  (including washing, rinsing, drying)?: A Lot Help from another person to put on and taking off regular upper body clothing?: A Little Help from another person to put on and taking off regular lower body clothing?: A Lot 6 Click Score: 16   End of Session Equipment Utilized During Treatment: Rolling walker (2 wheels) Nurse Communication: Mobility status  Activity Tolerance: Patient tolerated treatment well Patient left: with call bell/phone within reach;in chair;with chair alarm set;with family/visitor present  OT Visit Diagnosis: Other abnormalities of gait and mobility (R26.89);Muscle weakness (generalized) (M62.81);Pain                Time: 4098-1191 OT Time Calculation (min): 24 min Charges:  OT General Charges $OT Visit: 1 Visit OT Evaluation $OT Re-eval: 1 Re-eval  Mate Alegria, OTR/L 07/25/23, 2:24 PM  Kannon Baum E Cherlynn Popiel 07/25/2023, 2:20 PM

## 2023-07-25 NOTE — TOC Progression Note (Signed)
 Transition of Care [facility]) - Progression Note    Patient Details  Name: [name] MRN: [id number] Date of Birth: 10-30-1965  Transition of [facility]) CM/SW Contact  Bing Quarry, RN Phone Number: 07/25/2023, 3:50 PM  Clinical Narrative:  3/30: TPN continues today with IV antifungal.     Gabriel Cirri MSN RN CM  RN Case Manager [place]  Transitions of Care Direct Dial: [phone] (Weekends Only) [facility] Main Office Phone: [phone] [facility] Fax: [phone] [email or web address]          Expected Discharge Plan and Services                                               Social Determinants of Health (SDOH) Interventions SDOH Screenings   Food Insecurity: No Food Insecurity (07/14/2023)  Housing: Low Risk  (07/14/2023)  Transportation Needs: No Transportation Needs (07/14/2023)  Utilities: Not At Risk (07/14/2023)  Tobacco Use: High Risk (07/13/2023)    Readmission Risk Interventions     No data to display

## 2023-07-25 NOTE — Progress Notes (Signed)
 CC: POD # 8  evisercation and retention suture placement   Subjective: Feeling well, + flatus On CLD Kub reviewed w some dilated loops Wbc hovering 21k, has not been normal for the last 7 days  Objective: Vital signs in last 24 hours: Temp:  [98.2 F (36.8 C)-99.2 F (37.3 C)] 98.4 F (36.9 C) (03/30 0442) Pulse Rate:  [99-110] 99 (03/30 0442) Resp:  [20] 20 (03/30 0442) BP: (126-152)/(60-83) 152/69 (03/30 0442) SpO2:  [93 %-97 %] 93 % (03/30 1151) Weight:  [176.5 kg] 176.5 kg (03/30 0500) Last BM Date : 07/24/23  Intake/Output from previous day: 03/29 0701 - 03/30 0700 In: 1464.6 [P.O.:120; I.V.:1144.6; NG/GT:100; IV Piggyback:100] Out: 1975 [Urine:1600; Drains:375] Intake/Output this shift: Total I/O In: 560 [P.O.:560] Out: 1000 [Urine:1000]  Physical exam:  Debilitated morbidly obese  Abd: soft, retention sutures and wound intact w/o infection or peritonitis Drain serous. I removed the dressing and inspected the wound     Lab Results: CBC  Recent Labs    07/23/23 0800 07/25/23 0550  WBC 19.6* 21.0*  HGB 11.9* 11.2*  HCT 35.6* 33.0*  PLT 113* 128*   BMET Recent Labs    07/23/23 0800 07/25/23 0550  NA 138 132*  K 4.6 5.3*  CL 105 100  CO2 29 26  GLUCOSE 129* 107*  BUN 25* 27*  CREATININE 0.70 0.80  CALCIUM 8.6* 8.5*   PT/INR No results for input(s): "LABPROT", "INR" in the last 72 hours. ABG No results for input(s): "PHART", "HCO3" in the last 72 hours.  Invalid input(s): "PCO2", "PO2"  Studies/Results: DG ABD ACUTE 2+V W 1V CHEST Result Date: 07/25/2023 CLINICAL DATA:  [id number] Ileus following gastrointestinal surgery (HCC) [id number] EXAM: DG ABDOMEN ACUTE WITH 1 VIEW CHEST COMPARISON:  07/22/2023, 07/21/2023 FINDINGS: Enteric tube has been removed. Stable heart size. Extensive bilateral airspace opacities which may be slightly improved compared to the 07/21/2023 study. Surgical drain remains position within the upper abdomen. Multiple distended  loops of large and small bowel throughout the abdomen appears slightly improving compared to the 07/22/2023 study. No gross free intraperitoneal air. Overlying surgical staples. IMPRESSION: 1. Extensive bilateral airspace opacities which may be slightly improved compared to the 07/21/2023 study. 2. Multiple distended loops of large and small bowel throughout the abdomen appears slightly improving compared to the 07/22/2023 study. Electronically Signed   By: Duanne Guess D.O.   On: 07/25/2023 10:21    Anti-infectives: Anti-infectives (From admission, onward)    Start     Dose/Rate Route Frequency Ordered Stop   07/22/23 0800  vancomycin (VANCOREADY) IVPB 2000 mg/400 mL  Status:  Discontinued        2,000 mg 200 mL/hr over 120 Minutes Intravenous Every 12 hours 07/21/23 1856 07/22/23 1136   07/21/23 2145  vancomycin (VANCOCIN) IVPB 1000 mg/200 mL premix       Placed in "Followed by" Linked Group   1,000 mg 200 mL/hr over 60 Minutes Intravenous  Once 07/21/23 1845 07/21/23 2323   07/21/23 1930  vancomycin (VANCOREADY) IVPB 1500 mg/300 mL       Placed in "Followed by" Linked Group   1,500 mg 150 mL/hr over 120 Minutes Intravenous  Once 07/21/23 1845 07/21/23 2201   07/21/23 1200  fluconazole (DIFLUCAN) IVPB 400 mg       Placed in "Followed by" Linked Group   400 mg 100 mL/hr over 120 Minutes Intravenous Every 24 hours 07/20/23 1059 07/24/23 1452   07/20/23 1400  meropenem (MERREM) 1 g in sodium chloride  0.9 % 100 mL IVPB        1 g 200 mL/hr over 30 Minutes Intravenous Every 8 hours 07/20/23 1021 08/03/23 1359   07/20/23 1145  fluconazole (DIFLUCAN) IVPB 800 mg       Placed in "Followed by" Linked Group   800 mg 200 mL/hr over 120 Minutes Intravenous  Once 07/20/23 1059 07/20/23 1428   07/17/23 1400  piperacillin-tazobactam (ZOSYN) IVPB 3.375 g  Status:  Discontinued        3.375 g 12.5 mL/hr over 240 Minutes Intravenous Every 8 hours 07/17/23 0753 07/20/23 1021   07/17/23 0600   piperacillin-tazobactam (ZOSYN) IVPB 3.375 g        3.375 g 100 mL/hr over 30 Minutes Intravenous On call to O.R. 07/17/23 0349 07/17/23 0503   07/15/23 1045  amoxicillin-clavulanate (AUGMENTIN) 875-125 MG per tablet 1 tablet  Status:  Discontinued        1 tablet Oral Every 12 hours 07/15/23 0955 07/17/23 0753   07/13/23 1830  piperacillin-tazobactam (ZOSYN) IVPB 3.375 g  Status:  Discontinued        3.375 g 12.5 mL/hr over 240 Minutes Intravenous Every 8 hours 07/13/23 1743 07/15/23 0955   07/13/23 1815  cefTRIAXone (ROCEPHIN) 1 g in sodium chloride 0.9 % 100 mL IVPB  Status:  Discontinued        1 g 200 mL/hr over 30 Minutes Intravenous Every 24 hours 07/13/23 1728 07/13/23 1729   07/13/23 0715  ceFAZolin (ANCEF) IVPB 3g/150 mL premix        3 g 300 mL/hr over 30 Minutes Intravenous On call to O.R. 07/13/23 0700 07/13/23 0843       Assessment/Plan:  Ileus resolving but we will take it slow May need to repeat CT in am but I would let primary surgeon decide depending on clinical condition CLD for now since still dilated loops on x ray Mobilize Continue TPN for now  Sterling Big, MD, FACS  07/25/2023

## 2023-07-25 NOTE — Progress Notes (Signed)
 PHARMACY - TOTAL PARENTERAL NUTRITION CONSULT NOTE   Indication: Prolonged ileus  Patient Measurements: Height: 6\' 2"  (188 cm) Weight: (!) 176.5 kg (389 lb 1.8 oz) IBW/kg (Calculated) : 82.2 TPN AdjBW (KG): 101.3 Body mass index is 49.96 kg/m.  Assessment:  58 y.o. male w/ PMH of morbid obesity, mild intermittent asthma, right inguinal hernia, GERD, OSA, migraines admitted on 07/13/2023 with a ventral hernia.   Glucose / Insulin: BG 112 - 116 /  0 units SSI required Electrolytes: slight hyponatremia Renal: SCr 0.94>1.69-->0.8 Hepatic: bilirubin elevated, stable / INR 1.3 --LFTs wnl --PLT low at baseine, stable Intake / Output; MIVF: net -7.4L GI Imaging: 03/22 abd Xray Dilatation of small bowel in the mid to lower abdomen, Mild to moderate fecal stasis   GI Surgeries / Procedures:  03/22 Exploratory Laparotomy, abdominal washout, abdominal closure   Nutritional Goals: Goal TPN rate is 120 mL/hr (provides 144 g of protein and 2718 kcals per day)  RD Assessment: Estimated Needs Total Energy Estimated Needs: 2700-3000kcal/day Total Protein Estimated Needs: 135-150g/day Total Fluid Estimated Needs: 2.7-3.0L/day   Central access: 07/16/23 TPN start date: 07/17/23  Current Nutrition:  NPO  Plan:  ---Continue compounded TPN today at goal rate of 120 ml/hr at 1800 ---Electrolytes in TPN: Na 64mEq/L, K 50 > 25mEq/L, Ca 52mEq/L, Mg 55mEq/L, and Phos 25mmol/L. Cl:Ac 1:1 - Decreased K+ in TPN due to elevated level. Lokelma ordered.  ---Add standard MVI and trace elements to TPN ---thiamine 100mg  and folic acid 1mg  being supplemented outside of TPN ---continue Sensitive q6h SSI and adjust as needed  ---Monitor TPN labs on Mon/Thurs, daily until stable  Ronnald Ramp, PharmD, BCPS 07/25/2023,9:05 AM

## 2023-07-25 NOTE — Progress Notes (Signed)
 Called with DVT right arm.  Will have them remove picc line right arm and try to replace picc in left arm.  Spoke with Surgery and okay for full anticoagulation with lovenox or heparin.  Since he is a poor iv access, I will try lovenox.  Spoke with pharmacy and okay to use lovenox for now.  Hopefully can convert to eliquis if no bleeding.  Dr Alford Highland

## 2023-07-25 NOTE — Consult Note (Signed)
 PHARMACY - ANTICOAGULATION CONSULT NOTE  Pharmacy Consult for enoxaparin Indication: DVT  Allergies  Allergen Reactions   Sulfa Antibiotics Anaphylaxis and Hives    Patient Measurements: Height: 6\' 2"  (188 cm) Weight: (!) 176.5 kg (389 lb 1.8 oz) IBW/kg (Calculated) : 82.2 HEPARIN DW (KG): 119.6  Vital Signs: Temp: 99 F (37.2 C) (03/30 1359) Temp Source: Oral (03/30 1359) BP: 129/61 (03/30 1359) Pulse Rate: 111 (03/30 1359)  Labs: Recent Labs    07/23/23 0800 07/25/23 0550  HGB 11.9* 11.2*  HCT 35.6* 33.0*  PLT 113* 128*  CREATININE 0.70 0.80    Estimated Creatinine Clearance: 176.9 mL/min (by C-G formula based on SCr of 0.8 mg/dL).   Medical History: Past Medical History:  Diagnosis Date   Asthma    as a child   Bronchitis 03/2016   pt has recovered from bronchitis   Chickenpox    Depression    Frequent headaches    GERD (gastroesophageal reflux disease)    when I was younger, better now.   Hypertension    Migraines    history of, not now   OSA (obstructive sleep apnea)     Medications:  No anticoagulation prior to consult per pharmacist review  Assessment: 58yo male presented for elective hernia repair complicated by bleeding.  Patient required admission to ICU due to shock.  Patient has now developed a right upper extremity DVT likely related to PICC line.  Pharmacy consulted to initiate therapeutic enoxaparin.  Goal of Therapy:  Anti-Xa level 0.6-1 units/ml 4hrs after LMWH dose given Monitor platelets by anticoagulation protocol: Yes   Plan:  Start enoxaparin 1 mg/kg (175 mg) SubQ every 12 hours Consider checking Anti-Xa level if prolonged use CBC at least every 72 hours  Barrie Folk, PharmD 07/25/2023,5:41 PM

## 2023-07-25 NOTE — Assessment & Plan Note (Signed)
 Nursing staff concerned about right arm swelling will hold TPN for right now.  Consulted PICC line team for evaluation.  Will get right upper extremity ultrasound.

## 2023-07-25 NOTE — Assessment & Plan Note (Signed)
Will give a dose of Lokelma

## 2023-07-25 NOTE — Progress Notes (Signed)
 A consult was placed to the IV Nurse to evaluate the patient's Right arm due to significant edema;  pt had a TL PICC placed 07/20/23; length 43 cms;  staff changed the dressing today and PICC is noted to be out approx 1-2cms; pt also noted to have blisters below the dressing;  entire right arm (including hand and fingers) is significantly more edematous compared to his left arm; he is able move his arm without discomfort; very difficult to assess for pulses due to edema; all 3 ports of the PICC have excellent blood returns and the PICC flushes easily;  staff has stopped all infusions to the PICC, pending evaluation;  Suggest doppler study of right arm. Pt is up in the recliner at present with family at bedside.

## 2023-07-25 NOTE — Progress Notes (Signed)
 Progress Note   Patient: [name] GNF:[id number] DOB: 1965/09/24 DOA: 07/13/2023     12 DOS: the patient was seen and examined on 07/25/2023   Brief hospital course: 58 yo M with hx of obesity , OSA, , EtOH abuse hx of admission to hospital for ETOH withdrawal. He came in for elective surgery to have inguinal hernia repair with lysis of adhesions. He had significant bleeding per surgeon which was unusual and required electrocautery and ligature for hemostasis. He was brought back with abdominal JP drain and had FFP and platelets ordered due to intraoperative bleeding. After receiving these blood products patient deteriorated and developed circulatory shock, hypoxemia, swelling worse on lower extremities. He had code RRT called and was emergently brought to ICU due to shock with hypoxemia. He was treated for anaphylactic non febrile blood product reaction vs TRALI and improved overnight. He became stable and was downgraded to medical floor.   3/18 patient had bilateral inguinal hernia repair plus lysis of adhesions and exploratory laparotomy. 3/22.  Patient was brought to the operating room by Dr. Aleen Campi for fascial dehiscence with small bowel evisceration and handout exploratory laparotomy with abdominal washout and abdominal closure with retention sutures.  07/17/23- patient had code RRT overnight, he had findings of small bowel evisceration secondary to fascial dehiscence.  This likely stemmed from forceful cough per surgery service.  He became unstable in distress and required endotracheal intubation.  He was taken to OR emergently for ex-lap and closure of abdominal cavity.  Daughter was available to come in and we reviewed medical plan. CVl placed   07/18/23- extubated but reports severe pain. Daughter at bedside and patient is improved able to speak in full sentences.  Reviewed case with surgery and placed abdominal binder.  He is on CIWA due to reported daily drinking.    3/24 lethargic but  arousable, remains  on pressors   3/25 remains on pressors, foley in place.  Central line removed and PICC line placed  3/26.  Medical team took over.  Foley catheter removed.  Patient spiked a fever of 101.  Blood cultures, chest x-ray, urine analysis and urine culture (after Foley removed).  Patient already on fluconazole and meropenem. 3/27.  Since MRSA PCR came back negative vancomycin was discontinued.  NG tube came out and needed to be replaced. 3/28.  Patient started on clear liquid diet.  Still has NG tube. 3/29.  Will continue IV Lasix on a daily basis with weight gain up to 395 pounds.  Patient states that he normally is around 350.    Assessment and Plan: * Septic shock (HCC) Secondary to abdominal wound dehiscence.  Patient on meropenem and completed Diflucan on 3/29.  Fever On 3/26.  No recurrence.  Chest x-ray showing multifocal pneumonia.  Already on meropenem.  Central line removed 3/25 and PICC line placed 3/25.  Foley catheter removed 3/25.  Blood cultures are negative for 4 days.  White blood cell count still high.  Perioperative dehiscence of abdominal wound with evisceration Continue antibiotics.  Continue TPN.  Patient had NG tube removed on 3/29.  Still on clear liquid diet on 3/30.  Swelling of arm Nursing staff concerned about right arm swelling will hold TPN for right now.  Consulted PICC line team for evaluation.  Will get right upper extremity ultrasound.  Hyponatremia Continue to monitor on Lasix  Hyperkalemia Will give a dose of Lokelma  Morbid obesity with BMI of 50.0-59.9, adult [facility]) Patient has had quite a bit  of weight gain during the hospital course received quite a bit of fluids TPN and antibiotics.  Will give Lasix and albumin.  BMI down to 49.96 today.  AKI (acute kidney injury) (HCC) Resolved.  Creatinine 1.29 on 3/19.  Last creatinine 0.80.  History of alcohol abuse On thiamine and folic acid  Acute respiratory failure with hypoxia and  hypercapnia (HCC) Patient on 1.5 L of oxygen.  Required intubation and extubation during the hospital course.  Increase Lasix to twice daily dosing  Thrombocytopenia (HCC) Platelet count 128.  TRALI (transfusion related acute lung injury) .  Inguinal hernia Initial surgery.        Subjective: Patient feels a little short of breath.  Physical Exam: Vitals:   07/25/23 0500 07/25/23 0841 07/25/23 1151 07/25/23 1359  BP:    129/61  Pulse:    (!) 111  Resp:      Temp:    99 F (37.2 C)  TempSrc:    Oral  SpO2:  93% 93% 91%  Weight: (!) 176.5 kg     Height:       Physical Exam HENT:     Head: Normocephalic.     Mouth/Throat:     Pharynx: No oropharyngeal exudate.  Eyes:     General: Lids are normal.     Conjunctiva/sclera: Conjunctivae normal.  Cardiovascular:     Rate and Rhythm: Normal rate and regular rhythm.     Heart sounds: Normal heart sounds, S1 normal and S2 normal.  Pulmonary:     Breath sounds: Examination of the right-lower field reveals decreased breath sounds. Examination of the left-lower field reveals decreased breath sounds. Decreased breath sounds present. No wheezing, rhonchi or rales.  Abdominal:     Palpations: Abdomen is soft.     Tenderness: There is no abdominal tenderness.  Musculoskeletal:     Right lower leg: No swelling.     Left lower leg: No swelling.  Skin:    General: Skin is warm.     Findings: No rash.     Comments: Chronic brownish discoloration lower extremities.  Neurological:     Mental Status: He is alert.     Data Reviewed: Chest x-ray showing extensive bilateral airspace opacities slightly improved from prior, multiple distended loops of large and small bowel throughout the abdomen could be slightly improved since prior White blood cell count 21, hemoglobin 11.2, platelet count 128 Sodium 132, potassium 5.3, creatinine 0.8  Family Communication: Daughter at bedside  Disposition: Status is: Inpatient Remains  inpatient appropriate because: Patient on clear liquid diet and TPN  Planned Discharge Destination: Skilled nursing facility    Time spent: 27 minutes  Author: Alford Highland, MD 07/25/2023 2:45 PM  For on call review [email or web address].

## 2023-07-26 ENCOUNTER — Inpatient Hospital Stay: Payer: MEDICAID

## 2023-07-26 DIAGNOSIS — I82621 Acute embolism and thrombosis of deep veins of right upper extremity: Secondary | ICD-10-CM | POA: Diagnosis not present

## 2023-07-26 DIAGNOSIS — A419 Sepsis, unspecified organism: Secondary | ICD-10-CM | POA: Diagnosis not present

## 2023-07-26 DIAGNOSIS — T81321S Disruption or dehiscence of closure of internal operation (surgical) wound of abdominal wall muscle or fascia, sequela: Secondary | ICD-10-CM | POA: Diagnosis not present

## 2023-07-26 DIAGNOSIS — R509 Fever, unspecified: Secondary | ICD-10-CM | POA: Diagnosis not present

## 2023-07-26 DIAGNOSIS — R21 Rash and other nonspecific skin eruption: Secondary | ICD-10-CM | POA: Insufficient documentation

## 2023-07-26 LAB — TRIGLYCERIDES: Triglycerides: 64 mg/dL (ref <150)

## 2023-07-26 LAB — GLUCOSE, CAPILLARY
Glucose-Capillary: 72 mg/dL (ref 70–99)
Glucose-Capillary: 78 mg/dL (ref 70–99)
Glucose-Capillary: 81 mg/dL (ref 70–99)
Glucose-Capillary: 109 mg/dL — ABNORMAL HIGH (ref 70–99)

## 2023-07-26 LAB — CULTURE, BLOOD (ROUTINE X 2)
Culture: NO GROWTH
Culture: NO GROWTH
Special Requests: ADEQUATE
Special Requests: ADEQUATE

## 2023-07-26 LAB — COMPREHENSIVE METABOLIC PANEL WITH GFR
ALT: 33 U/L (ref 0–44)
AST: 73 U/L — ABNORMAL HIGH (ref 15–41)
Albumin: 1.7 g/dL — ABNORMAL LOW (ref 3.5–5.0)
Alkaline Phosphatase: 75 U/L (ref 38–126)
Anion gap: 6 (ref 5–15)
BUN: 26 mg/dL — ABNORMAL HIGH (ref 6–20)
CO2: 28 mmol/L (ref 22–32)
Calcium: 8.7 mg/dL — ABNORMAL LOW (ref 8.9–10.3)
Chloride: 98 mmol/L (ref 98–111)
Creatinine, Ser: 0.9 mg/dL (ref 0.61–1.24)
GFR, Estimated: >60 mL/min (ref >60)
Glucose, Bld: 81 mg/dL (ref 70–99)
Potassium: 5.1 mmol/L (ref 3.5–5.1)
Sodium: 132 mmol/L — ABNORMAL LOW (ref 135–145)
Total Bilirubin: 2.1 mg/dL — ABNORMAL HIGH (ref 0.0–1.2)
Total Protein: 6 g/dL — ABNORMAL LOW (ref 6.5–8.1)

## 2023-07-26 LAB — BILIRUBIN, DIRECT: Bilirubin, Direct: 0.9 mg/dL — ABNORMAL HIGH (ref 0.0–0.2)

## 2023-07-26 MED ORDER — IPRATROPIUM-ALBUTEROL 0.5-2.5 (3) MG/3ML IN SOLN
3.0000 mL | Freq: Three times a day (TID) | RESPIRATORY_TRACT | Status: DC
  Administered 2023-07-26 – 2023-08-01 (×16): 3 mL via RESPIRATORY_TRACT
  Filled 2023-07-26 (×19): qty 3

## 2023-07-26 MED ORDER — IOHEXOL 300 MG/ML  SOLN
30.0000 mL | Freq: Once | INTRAMUSCULAR | Status: AC | PRN
  Administered 2023-07-26: 30 mL via ORAL

## 2023-07-26 MED ORDER — SODIUM PHOSPHATES 45 MMOLE/15ML IV SOLN
INTRAVENOUS | Status: DC
  Filled 2023-07-26: qty 1440

## 2023-07-26 MED ORDER — NYSTATIN 100000 UNIT/GM EX POWD
Freq: Three times a day (TID) | CUTANEOUS | Status: DC
  Filled 2023-07-26 (×2): qty 15

## 2023-07-26 MED ORDER — IOHEXOL 300 MG/ML  SOLN
100.0000 mL | Freq: Once | INTRAMUSCULAR | Status: AC | PRN
  Administered 2023-07-26: 100 mL via INTRAVENOUS

## 2023-07-26 MED ORDER — ALBUMIN HUMAN 25 % IV SOLN
12.5000 g | Freq: Once | INTRAVENOUS | Status: AC
Start: 2023-07-27 — End: 2023-07-27
  Administered 2023-07-27: 12.5 g via INTRAVENOUS
  Filled 2023-07-26: qty 50

## 2023-07-26 NOTE — Progress Notes (Addendum)
 PHARMACY - TOTAL PARENTERAL NUTRITION CONSULT NOTE   Indication: Prolonged ileus  Patient Measurements: Height: 6\' 2"  (188 cm) Weight: (!) 174.2 kg (384 lb 0.7 oz) IBW/kg (Calculated) : 82.2 TPN AdjBW (KG): 101.3 Body mass index is 49.31 kg/m.  Assessment:  58 y.o. male w/ PMH of morbid obesity, mild intermittent asthma, right inguinal hernia, GERD, OSA, migraines admitted on 07/13/2023 with a ventral hernia.   Glucose / Insulin: BG 72-96/  0 units SSI required/last 24 hrs Electrolytes: slight hyponatremia,  K 5.1 Adjust K amount in TPN Renal: SCr 0.90 Hepatic: bilirubin/direct bilirubin elevated / INR 1.4 --AST 73 elevated --PLT low at baseine, stable TG: 64 Intake / Output; MIVF:  GI Imaging: 03/22 abd Xray Dilatation of small bowel in the mid to lower abdomen, Mild to moderate fecal stasis   GI Surgeries / Procedures:  03/22 Exploratory Laparotomy, abdominal washout, abdominal closure   Nutritional Goals: Goal TPN rate is 120 mL/hr (provides 144 g of protein and 2718 kcals per day)  RD Assessment: Estimated Needs Total Energy Estimated Needs: 2700-3000kcal/day Total Protein Estimated Needs: 135-150g/day Total Fluid Estimated Needs: 2.7-3.0L/day   Central access: 07/16/23 TPN start date: 07/17/23  Current Nutrition:  Clear liquid 3/29  Plan:  ---Hold compounded TPN today (was at goal rate of 120 ml/hr at 1800)- patient with right arm DVT and PICC line removed ---Electrolytes in TPN: Na 84mEq/L,(decrease K 50 mEg/L > 10 mEq/L > 5 mEq/L on 3/31- but TPN put on hold), Ca 20mEq/L, Mg 15mEq/L, and Phos 31mmol/L. Cl:Ac 1:1 - Decreased K+ in TPN due to elevated level.  ---Add standard MVI and trace elements to TPN ---thiamine 100mg  and folic acid 1mg  being supplemented outside of TPN ---continue Sensitive q6h SSI and adjust as needed  ---Monitor TPN labs on Mon/Thurs, daily until stable  Bari Mantis PharmD Clinical Pharmacist 07/26/2023

## 2023-07-26 NOTE — Progress Notes (Signed)
 Physical Therapy Treatment Patient Details Name: [name] MRN: [id number] DOB: [birth year] Today's Date: 07/26/2023   History of Present Illness Patient is a 58 year old male here for elective  inguinal hernia repair. Had bleeding  required electrocautery and ligature for hemostasis. After getting blood products, patient deteriorated, treated for shock and hypoxemia. Had rapid response with small bowel evisceration secondary to fascial dehiscence requiring surgery for evisercation and retention suture placement. Requiring pressor support.    PT Comments  Pt seen for PT tx with pt reporting fatigue but agreeable to tx, daughter present in room. Pt is able to transfer supine>sit with supervision with hospital bed features, appears more alert once sitting. Pt is able to ambulate short distance in room with RW & CGA but fatigues very quickly. Pt would benefit from ongoing PT treatment to progress endurance, strength, gait with LRAD.    If plan is discharge home, recommend the following: Assistance with cooking/housework;Assist for transportation;Help with stairs or ramp for entrance;A little help with walking and/or transfers;A little help with bathing/dressing/bathroom   Can travel by private vehicle     Yes  Equipment Recommendations  Hospital bed;Rollator (4 wheels);BSC/3in1    Recommendations for Other Services       Precautions / Restrictions Precautions Precautions: Fall Recall of Precautions/Restrictions: Intact Precaution/Restrictions Comments: abdominal binder Restrictions Weight Bearing Restrictions Per Provider Order: No     Mobility  Bed Mobility Overal bed mobility: Needs Assistance Bed Mobility: Supine to Sit     Supine to sit: Min assist, Contact guard, Supervision, HOB elevated, Used rails Sit to supine: Min assist (assistance to elevate LLE onto bed)        Transfers Overall transfer level: Needs assistance Equipment used: Rolling walker (2  wheels) Transfers: Sit to/from Stand Sit to Stand: Supervision           General transfer comment: STS from EOB    Ambulation/Gait Ambulation/Gait assistance: Contact guard assist Gait Distance (Feet): 6 Feet Assistive device: Rolling walker (2 wheels) Gait Pattern/deviations: Decreased step length - right, Decreased step length - left, Decreased dorsiflexion - right, Decreased dorsiflexion - left, Decreased stride length Gait velocity: decreased     General Gait Details: Pt ambulates to end of bed then requests to return to bed 2/2 fatigue   Stairs             Wheelchair Mobility     Tilt Bed    Modified Rankin (Stroke Patients Only)       Balance Overall balance assessment: Needs assistance Sitting-balance support: Feet supported Sitting balance-Leahy Scale: Good     Standing balance support: Bilateral upper extremity supported, Reliant on assistive device for balance, During functional activity Standing balance-Leahy Scale: Fair                              Hotel manager: No apparent difficulties  Cognition Arousal: Alert Behavior During Therapy: WFL for tasks assessed/performed   PT - Cognitive impairments: No apparent impairments                         Following commands: Impaired Following commands impaired: Follows one step commands with increased time    Cueing Cueing Techniques: Verbal cues, Visual cues, Tactile cues  Exercises      General Comments General comments (skin integrity, edema, etc.): educated pt & daughter on how to position bed in chair position, reviewed use  of incentive spirometer, encouraged pt to sit upright      Pertinent Vitals/Pain Pain Assessment Pain Assessment: Faces Faces Pain Scale: Hurts even more Pain Location: RUE Pain Descriptors / Indicators: Discomfort Pain Intervention(s): Monitored during session    Home Living                           Prior Function            PT Goals (current goals can now be found in the care plan section) Acute Rehab PT Goals Patient Stated Goal: to go home PT Goal Formulation: With patient/family Time For Goal Achievement: 08/03/23 Potential to Achieve Goals: Fair Progress towards PT goals: Progressing toward goals    Frequency    Min 2X/week      PT Plan      Co-evaluation              AM-PAC PT "6 Clicks" Mobility   Outcome Measure  Help needed turning from your back to your side while in a flat bed without using bedrails?: A Little Help needed moving from lying on your back to sitting on the side of a flat bed without using bedrails?: A Lot Help needed moving to and from a bed to a chair (including a wheelchair)?: A Little Help needed standing up from a chair using your arms (e.g., wheelchair or bedside chair)?: A Little Help needed to walk in hospital room?: A Little Help needed climbing 3-5 steps with a railing? : A Lot 6 Click Score: 16    End of Session Equipment Utilized During Treatment:  (abdominal binder) Activity Tolerance: Patient limited by fatigue Patient left: in bed;with call bell/phone within reach;with bed alarm set;with family/visitor present Nurse Communication: Mobility status PT Visit Diagnosis: Unsteadiness on feet (R26.81);Difficulty in walking, not elsewhere classified (R26.2);Muscle weakness (generalized) (M62.81)     Time: 7829-5621 PT Time Calculation (min) (ACUTE ONLY): 26 min  Charges:    $Therapeutic Activity: 23-37 mins PT General Charges $$ ACUTE PT VISIT: 1 Visit                     Aleda Grana, PT, DPT 07/26/23, 12:43 PM   Sandi Mariscal 07/26/2023, 12:39 PM

## 2023-07-26 NOTE — Progress Notes (Signed)
 Progress Note   Patient: [name]:[id number] DOB: 1965-11-06 DOA: 07/13/2023     13 DOS: the patient was seen and examined on 07/26/2023   Brief hospital course: 58 yo M with hx of obesity , OSA, , EtOH abuse hx of admission to hospital for ETOH withdrawal. He came in for elective surgery to have inguinal hernia repair with lysis of adhesions. He had significant bleeding per surgeon which was unusual and required electrocautery and ligature for hemostasis. He was brought back with abdominal JP drain and had FFP and platelets ordered due to intraoperative bleeding. After receiving these blood products patient deteriorated and developed circulatory shock, hypoxemia, swelling worse on lower extremities. He had code RRT called and was emergently brought to ICU due to shock with hypoxemia. He was treated for anaphylactic non febrile blood product reaction vs TRALI and improved overnight. He became stable and was downgraded to medical floor.   3/18 patient had bilateral inguinal hernia repair plus lysis of adhesions and exploratory laparotomy. 3/22.  Patient was brought to the operating room by Dr. Aleen Campi for fascial dehiscence with small bowel evisceration and handout exploratory laparotomy with abdominal washout and abdominal closure with retention sutures.  07/17/23- patient had code RRT overnight, he had findings of small bowel evisceration secondary to fascial dehiscence.  This likely stemmed from forceful cough per surgery service.  He became unstable in distress and required endotracheal intubation.  He was taken to OR emergently for ex-lap and closure of abdominal cavity.  Daughter was available to come in and we reviewed medical plan. CVl placed   07/18/23- extubated but reports severe pain. Daughter at bedside and patient is improved able to speak in full sentences.  Reviewed case with surgery and placed abdominal binder.  He is on CIWA due to reported daily drinking.    3/24 lethargic but  arousable, remains  on pressors   3/25 remains on pressors, foley in place.  Central line removed and PICC line placed  3/26.  Medical team took over.  Foley catheter removed.  Patient spiked a fever of 101.  Blood cultures, chest x-ray, urine analysis and urine culture (after Foley removed).  Patient already on fluconazole and meropenem. 3/27.  Since MRSA PCR came back negative vancomycin was discontinued.  NG tube came out and needed to be replaced. 3/28.  Patient started on clear liquid diet.  Still has NG tube. 3/29.  Will continue IV Lasix on a daily basis with weight gain up to 395 pounds.  Patient states that he normally is around 350. 3/30.  Right arm swelling.  Ultrasound showed no DVT.  PICC line removed.  TPN stopped.  Started Lovenox injections. 3/31.  CT scan of the abdomen pelvis shows postoperative ileus.  CT scan of the chest shows pneumonia.  Still on meropenem.  Nystatin powder under skin folds.     Assessment and Plan: * Septic shock (HCC) Secondary to abdominal wound dehiscence.  Patient on meropenem and completed Diflucan on 3/29.  Fever On 3/26.  No recurrence.  Chest x-ray showing multifocal pneumonia.  Already on meropenem.  Central line removed 3/25 and PICC line placed 3/25.  Foley catheter removed 3/25.  Blood cultures are negative for 5 days.  White blood cell count still high.  Recheck CBC tomorrow with procalcitonin.  Perioperative dehiscence of abdominal wound with evisceration Continue antibiotics.  Continue TPN.  Patient had NG tube removed on 3/29.  Still on clear liquid diet on 3/30.  General surgery advance  diet to solid food on 3/31.  Arm DVT (deep venous thromboembolism), acute, right (HCC) Lovenox injections for now hopefully can switch over to Eliquis if tolerates solid food.  PICC line removed on 3/30.  Skin rash Patient has some sloughing of the skin underneath skin folds and in the armpit.  Will use nystatin powder.  Swelling of arm Nursing  staff concerned about right arm swelling will hold TPN for right now.  Consulted PICC line team for evaluation.  Will get right upper extremity ultrasound.  Hyponatremia Continue to monitor on Lasix  Hyperkalemia Will give a dose of Lokelma  Morbid obesity with BMI of 50.0-59.9, adult [facility]) Patient has had quite a bit of weight gain during the hospital course received quite a bit of fluids TPN and antibiotics.  Continue twice daily Lasix.  Will give IV albumin tomorrow.  BMI down to 49.31 today.  AKI (acute kidney injury) (HCC) Resolved.  Creatinine 1.29 on 3/19.  Creatinine went as low to 0.7.  Current creatinine 0.9 but receiving IV Lasix twice daily  History of alcohol abuse On thiamine and folic acid  Acute respiratory failure with hypoxia and hypercapnia (HCC) Patient on 4 L of oxygen.  Required intubation and extubation during the hospital course.  Continue Lasix to twice daily dosing  Thrombocytopenia (HCC) Platelet count 128.  TRALI (transfusion related acute lung injury) .  Inguinal hernia Initial surgery.        Subjective: Patient advance to solid food by surgery after CT scan today.  Had a large bowel movement.  Some skin is sloughing off underneath the abdominal binder and armpits.  Physical Exam: Vitals:   07/26/23 0250 07/26/23 0500 07/26/23 0902 07/26/23 1501  BP: 126/69  112/62 124/63  Pulse: 97  (!) 104 (!) 103  Resp: (!) 22  16 19   Temp: 98.2 F (36.8 C)  98.9 F (37.2 C) 99.9 F (37.7 C)  TempSrc:      SpO2: 96%  92% 93%  Weight:  (!) 174.2 kg    Height:       Physical Exam HENT:     Head: Normocephalic.     Mouth/Throat:     Pharynx: No oropharyngeal exudate.  Eyes:     General: Lids are normal.     Conjunctiva/sclera: Conjunctivae normal.  Cardiovascular:     Rate and Rhythm: Normal rate and regular rhythm.     Heart sounds: Normal heart sounds, S1 normal and S2 normal.  Pulmonary:     Breath sounds: Examination of the right-lower  field reveals decreased breath sounds and rales. Examination of the left-lower field reveals decreased breath sounds and rales. Decreased breath sounds and rales present. No wheezing or rhonchi.  Abdominal:     Palpations: Abdomen is soft.     Tenderness: There is no abdominal tenderness.  Musculoskeletal:     Right lower leg: No swelling.     Left lower leg: No swelling.  Skin:    General: Skin is warm.     Findings: No rash.     Comments: Chronic brownish discoloration lower extremities.  Neurological:     Mental Status: He is alert.     Data Reviewed: Occlusive DVT throughout the right basilic axillary and subclavian vein. CT scan of the chest abdomen pelvis shows extensive partially completed PERI hilar airspace opacities in both lungs consistent with multilobar pneumonia postoperative ileus postsurgical changes in the anterior abdominal wall with drain in place Sodium 132, creatinine 0.9, AST 73, total protein  6.0, albumin 1.7  Family Communication: Spoke with daughter at the bedside  Disposition: Status is: Inpatient Remains inpatient appropriate because: Patient still on 4 L of oxygen.  Giving IV Lasix to try to get rid of fluid  Planned Discharge Destination: Skilled nursing facility    Time spent: 28 minutes  Author: Alford Highland, MD 07/26/2023 3:46 PM  For on call review [email or web address].

## 2023-07-26 NOTE — Assessment & Plan Note (Addendum)
 PICC line removed on 3/30.  Initially started on Lovenox injections but will switch over to Eliquis on the evening of 4/1.

## 2023-07-26 NOTE — Assessment & Plan Note (Signed)
 Patient has some sloughing of the skin underneath skin folds and in the armpit.  Will use nystatin powder.

## 2023-07-26 NOTE — Plan of Care (Signed)
  Problem: Pain Managment: Goal: General experience of comfort will improve and/or be controlled Outcome: Progressing   Problem: Safety: Goal: Ability to remain free from injury will improve Outcome: Progressing   Problem: Skin Integrity: Goal: Risk for impaired skin integrity will decrease Outcome: Progressing

## 2023-07-26 NOTE — Progress Notes (Signed)
 Gauze to right upper arm soiled with serous drainage. Dressing changed. Pt tolerated well.

## 2023-07-27 ENCOUNTER — Telehealth (HOSPITAL_COMMUNITY): Payer: Self-pay | Admitting: Pharmacy Technician

## 2023-07-27 ENCOUNTER — Other Ambulatory Visit (HOSPITAL_COMMUNITY): Payer: Self-pay

## 2023-07-27 DIAGNOSIS — A419 Sepsis, unspecified organism: Secondary | ICD-10-CM | POA: Diagnosis not present

## 2023-07-27 DIAGNOSIS — R509 Fever, unspecified: Secondary | ICD-10-CM | POA: Diagnosis not present

## 2023-07-27 DIAGNOSIS — I82621 Acute embolism and thrombosis of deep veins of right upper extremity: Secondary | ICD-10-CM | POA: Diagnosis not present

## 2023-07-27 DIAGNOSIS — R21 Rash and other nonspecific skin eruption: Secondary | ICD-10-CM | POA: Diagnosis not present

## 2023-07-27 LAB — CBC WITH DIFFERENTIAL/PLATELET
Abs Immature Granulocytes: 0.06 10*3/uL (ref 0.00–0.07)
Basophils Absolute: 0.1 10*3/uL (ref 0.0–0.1)
Basophils Relative: 1 %
Eosinophils Absolute: 0.5 10*3/uL (ref 0.0–0.5)
Eosinophils Relative: 4 %
HCT: 34.1 % — ABNORMAL LOW (ref 39.0–52.0)
Hemoglobin: 11.8 g/dL — ABNORMAL LOW (ref 13.0–17.0)
Immature Granulocytes: 1 %
Lymphocytes Relative: 13 %
Lymphs Abs: 1.6 10*3/uL (ref 0.7–4.0)
MCH: 34.5 pg — ABNORMAL HIGH (ref 26.0–34.0)
MCHC: 34.6 g/dL (ref 30.0–36.0)
MCV: 99.7 fL (ref 80.0–100.0)
Monocytes Absolute: 0.9 10*3/uL (ref 0.1–1.0)
Monocytes Relative: 8 %
Neutro Abs: 9.5 10*3/uL — ABNORMAL HIGH (ref 1.7–7.7)
Neutrophils Relative %: 73 %
Platelets: 161 10*3/uL (ref 150–400)
RBC: 3.42 MIL/uL — ABNORMAL LOW (ref 4.22–5.81)
RDW: 14.4 % (ref 11.5–15.5)
WBC: 12.6 10*3/uL — ABNORMAL HIGH (ref 4.0–10.5)
nRBC: 0 % (ref 0.0–0.2)

## 2023-07-27 LAB — BASIC METABOLIC PANEL WITH GFR
Anion gap: 6 (ref 5–15)
BUN: 25 mg/dL — ABNORMAL HIGH (ref 6–20)
CO2: 25 mmol/L (ref 22–32)
Calcium: 8.1 mg/dL — ABNORMAL LOW (ref 8.9–10.3)
Chloride: 98 mmol/L (ref 98–111)
Creatinine, Ser: 0.89 mg/dL (ref 0.61–1.24)
GFR, Estimated: >60 mL/min (ref >60)
Glucose, Bld: 101 mg/dL — ABNORMAL HIGH (ref 70–99)
Potassium: 4.3 mmol/L (ref 3.5–5.1)
Sodium: 129 mmol/L — ABNORMAL LOW (ref 135–145)

## 2023-07-27 LAB — GLUCOSE, CAPILLARY
Glucose-Capillary: 122 mg/dL — ABNORMAL HIGH (ref 70–99)
Glucose-Capillary: 117 mg/dL — ABNORMAL HIGH (ref 70–99)
Glucose-Capillary: 94 mg/dL (ref 70–99)
Glucose-Capillary: 113 mg/dL — ABNORMAL HIGH (ref 70–99)

## 2023-07-27 LAB — PROCALCITONIN: Procalcitonin: 0.35 ng/mL

## 2023-07-27 LAB — SEDIMENTATION RATE: Sed Rate: 56 mm/h — ABNORMAL HIGH (ref 0–20)

## 2023-07-27 MED ORDER — APIXABAN 5 MG PO TABS
5.0000 mg | ORAL_TABLET | Freq: Two times a day (BID) | ORAL | Status: DC
  Administered 2023-08-03 – 2023-08-05 (×4): 5 mg via ORAL
  Filled 2023-07-27 (×4): qty 1

## 2023-07-27 MED ORDER — APIXABAN 5 MG PO TABS
10.0000 mg | ORAL_TABLET | Freq: Two times a day (BID) | ORAL | Status: AC
  Administered 2023-07-27 – 2023-08-03 (×14): 10 mg via ORAL
  Filled 2023-07-27 (×14): qty 2

## 2023-07-27 MED ORDER — ENSURE ENLIVE PO LIQD
237.0000 mL | Freq: Three times a day (TID) | ORAL | Status: DC
  Administered 2023-07-28 – 2023-08-05 (×14): 237 mL via ORAL

## 2023-07-27 NOTE — NC FL2 (Signed)
 [place] MEDICAID FL2 LEVEL OF CARE FORM     IDENTIFICATION  Patient Name: [name] Birthdate: February 25, 1966 Sex: male Admission Date (Current Location): 07/13/2023  [facility] and [place] Number:  Chiropodist and Address:  [facility], [address] [place], [place] [address]      Provider Number: [id number]  Attending Physician Name and Address:  Alford Highland, MD  Relative Name and Phone Number:       Current Level of Care: Hospital Recommended Level of Care: Skilled Nursing Facility Prior Approval Number:    Date Approved/Denied:   PASRR Number: [id number]  Discharge Plan: SNF    Current Diagnoses: Patient Active Problem List   Diagnosis Date Noted   Skin rash 07/26/2023   Arm DVT (deep venous thromboembolism), acute, right (HCC) 07/26/2023   Hyperkalemia 07/25/2023   Hyponatremia 07/25/2023   Swelling of arm 07/25/2023   Morbid obesity with BMI of 50.0-59.9, adult (HCC) 07/24/2023   Weight gain 07/24/2023   Fever 07/21/2023   Thrombocytopenia (HCC) 07/21/2023   Acute respiratory failure with hypoxia and hypercapnia (HCC) 07/21/2023   Septic shock (HCC) 07/21/2023   History of alcohol abuse 07/21/2023   AKI (acute kidney injury) (HCC) 07/21/2023   Perioperative dehiscence of abdominal wound with evisceration 07/17/2023   Anaphylactic reaction 07/15/2023   TRALI (transfusion related acute lung injury) 07/15/2023   Ventral hernia without obstruction or gangrene 07/13/2023   Inguinal hernia 07/13/2023   Preventative health care 04/13/2016   Tobacco abuse 02/05/2016   Difficulty concentrating 02/05/2016    Orientation RESPIRATION BLADDER Height & Weight     Self, Time, Situation, Place  O2 (Nasal Cannula 4 L) Continent, External catheter Weight: (!) 384 lb 0.7 oz (174.2 kg) Height:  6\' 2"  (188 cm)  BEHAVIORAL SYMPTOMS/MOOD NEUROLOGICAL BOWEL NUTRITION STATUS   (None)  (None) Incontinent Diet (Regular)  AMBULATORY  STATUS COMMUNICATION OF NEEDS Skin   Limited Assist Verbally Bruising, Surgical wounds, Other (Comment) (Blister, erythema/redness, rash. Incision on abdomen: honeycomb, abdominal binder.)                       Personal Care Assistance Level of Assistance  Bathing, Feeding, Dressing Bathing Assistance: Maximum assistance Feeding assistance: Limited assistance Dressing Assistance: Maximum assistance     Functional Limitations Info  Sight, Hearing, Speech Sight Info: Adequate Hearing Info: Adequate Speech Info: Adequate    SPECIAL CARE FACTORS FREQUENCY  PT (By licensed PT), OT (By licensed OT)     PT Frequency: 5 x week OT Frequency: 5 x week            Contractures Contractures Info: Not present    Additional Factors Info  Code Status, Allergies Code Status Info: Full code Allergies Info: Sulfa Antibiotics           Current Medications (07/27/2023):  This is the current hospital active medication list Current Facility-Administered Medications  Medication Dose Route Frequency Provider Last Rate Last Admin   acetaminophen (TYLENOL) tablet 650 mg  650 mg Oral Q6H PRN Alford Highland, MD   650 mg at 07/21/23 1330   apixaban (ELIQUIS) tablet 10 mg  10 mg Oral BID Alford Highland, MD       Followed by   Melene Muller ON 08/03/2023] apixaban (ELIQUIS) tablet 5 mg  5 mg Oral BID Wieting, Richard, MD       chlorpheniramine-HYDROcodone (TUSSIONEX) 10-8 MG/5ML suspension 10 mL  10 mL Oral TID Littie Deeds, RPH   10  mL at 07/27/23 1029   diphenhydrAMINE (BENADRYL) injection 12.5 mg  12.5 mg Intravenous Q6H PRN Alford Highland, MD       folic acid (FOLVITE) tablet 1 mg  1 mg Oral Daily Aleskerov, Fuad, MD   1 mg at 07/27/23 1028   furosemide (LASIX) injection 40 mg  40 mg Intravenous BID Alford Highland, MD   40 mg at 07/27/23 1029   HYDROmorphone (DILAUDID) injection 1 mg  1 mg Intravenous Q2H PRN Piscoya, Jose, MD   1 mg at 07/22/23 0857   insulin aspart (novoLOG) injection 0-9  Units  0-9 Units Subcutaneous Q6H Lowella Bandy, RPH   1 Units at 07/27/23 1610   ipratropium-albuterol (DUONEB) 0.5-2.5 (3) MG/3ML nebulizer solution 3 mL  3 mL Nebulization TID Alford Highland, MD   3 mL at 07/27/23 0739   multivitamin with minerals tablet 1 tablet  1 tablet Oral Daily Vida Rigger, MD   1 tablet at 07/27/23 1028   nystatin (MYCOSTATIN/NYSTOP) topical powder   Topical TID Alford Highland, MD   Given at 07/27/23 1058   ondansetron (ZOFRAN-ODT) disintegrating tablet 4 mg  4 mg Oral Q6H PRN Piscoya, Elita Quick, MD       Or   ondansetron (ZOFRAN) injection 4 mg  4 mg Intravenous Q6H PRN Piscoya, Jose, MD   4 mg at 07/20/23 9604   Oral care mouth rinse  15 mL Mouth Rinse PRN Piscoya, Jose, MD       pantoprazole (PROTONIX) injection 40 mg  40 mg Intravenous Q24H Piscoya, Jose, MD   40 mg at 07/26/23 2142   thiamine (VITAMIN B1) tablet 100 mg  100 mg Oral Daily Vida Rigger, MD   100 mg at 07/27/23 1028   Or   thiamine (VITAMIN B1) injection 100 mg  100 mg Intravenous Daily Vida Rigger, MD   100 mg at 07/22/23 1100     Discharge Medications: Please see discharge summary for a list of discharge medications.  Relevant Imaging Results:  Relevant Lab Results:   Additional Information SS#: [id number]  Margarito Liner, LCSW

## 2023-07-27 NOTE — TOC Progression Note (Signed)
 Transition of Care [facility]) - Progression Note    Patient Details  Name: [name] MRN: [id number] Date of Birth: 05-21-1965  [facility] CM/SW Contact  Margarito Liner, LCSW Phone Number: 07/27/2023, 1:09 PM  Clinical Narrative: CSW met with patient and daughter. Patient is agreeable to SNF placement if [facility] can find a facility that is in network with his insurance.    Expected Discharge Plan and Services                                               Social Determinants of Health (SDOH) Interventions SDOH Screenings   Food Insecurity: No Food Insecurity (07/14/2023)  Housing: Low Risk  (07/14/2023)  Transportation Needs: No Transportation Needs (07/14/2023)  Utilities: Not At Risk (07/14/2023)  Tobacco Use: High Risk (07/13/2023)    Readmission Risk Interventions     No data to display

## 2023-07-27 NOTE — Telephone Encounter (Signed)
 Patient Product/process development scientist completed.    The patient is insured through [employer] [place] [employer] [place] .     Ran test claim for Eliquis 5 mg and the current 30 day co-pay is $4.00.   This test claim was processed through [facility]- copay amounts may vary at other pharmacies due to pharmacy/plan contracts, or as the patient moves through the different stages of their insurance plan.     [name], CPHT Pharmacy Technician III Certified Patient Advocate [facility] Patient Advocate Team Direct Number: [phone]  Fax: [phone]

## 2023-07-27 NOTE — Consult Note (Signed)
 PHARMACY - ANTICOAGULATION CONSULT NOTE  Pharmacy Consult for apixaban Indication: DVT  Allergies  Allergen Reactions   Sulfa Antibiotics Anaphylaxis and Hives    Patient Measurements: Height: 6\' 2"  (188 cm) Weight: (!) 174.2 kg (384 lb 0.7 oz) IBW/kg (Calculated) : 82.2 HEPARIN DW (KG): 119.6  Vital Signs: Temp: 98.3 F (36.8 C) (04/01 0832) Temp Source: Oral (04/01 0832) BP: 130/75 (04/01 0832) Pulse Rate: 105 (04/01 0832)  Labs: Recent Labs    07/25/23 0550 07/25/23 1722 07/26/23 0558 07/27/23 0437  HGB 11.2*  --   --  11.8*  HCT 33.0*  --   --  34.1*  PLT 128*  --   --  161  LABPROT  --  17.8*  --   --   INR  --  1.4*  --   --   CREATININE 0.80  --  0.90 0.89    Estimated Creatinine Clearance: 157.8 mL/min (by C-G formula based on SCr of 0.89 mg/dL).   Medical History: Past Medical History:  Diagnosis Date   Asthma    as a child   Bronchitis 03/2016   pt has recovered from bronchitis   Chickenpox    Depression    Frequent headaches    GERD (gastroesophageal reflux disease)    when I was younger, better now.   Hypertension    Migraines    history of, not now   OSA (obstructive sleep apnea)     Medications:  No anticoagulation prior to consult per pharmacist review  Assessment: 58yo male presented for elective hernia repair complicated by bleeding.  Patient required admission to ICU due to shock.  Patient has now developed a right upper extremity DVT likely related to PICC line.  Pharmacy consulted to initiate apixaban (transition from therapeutic enoxaparin)  Goal of Therapy:  Monitor platelets by anticoagulation protocol: Yes   Plan:  Start apixaban 10 mg po BID x 7 days followed by 5 mg po BID (per discussion with MD). Start when next Lovenox dose was due 4/1 pm. CBC/Scr at least every 72 hours  Bari Mantis PharmD Clinical Pharmacist 07/27/2023

## 2023-07-27 NOTE — Progress Notes (Signed)
 Nutrition Follow Up Note   DOCUMENTATION CODES:   Morbid obesity  INTERVENTION:   Ensure Enlive po TID, each supplement provides 350 kcal and 20 grams of protein.  MVI po daily   Daily weights   NUTRITION DIAGNOSIS:   Inadequate oral intake related to altered GI function as evidenced by NPO status. -resolved   GOAL:   Patient will meet greater than or equal to 90% of their needs -previously met with TPN   MONITOR:   PO intake, Supplement acceptance, Labs, Weight trends, Skin, I & O's  ASSESSMENT:   58 y/o male with h/o etoh abuse, HTN, OSA, asthma, depression, GERD, incarcerated ventral hernia s/p repair 2014 (no resections), hydrocele s/p right hydrocelectomy 2018 and bilateral inguinal hernias who is admitted for elective right inguinal hernia repair & intra-abdominal adhesions s/p diagnostic laparoscopy, converted to exploratory laparotomy & lysis of adhesions with aborted hernia repair 3/18 secondary to bleeding, hematoma and shock complicated by fascial dehiscence with small bowel evisceration s/p exploratory laparotomy, abdominal washout, abdominal closure with retention sutures 3/22 complicated by post op ileus.  Pt previously tolerating TPN well but developed arm swelling and PICC line was removed 3/30 and TPN discontinued. Pt initiated on a regular diet yesterday and is noted to be eating 100% of meals. RD will add supplements and MVI to help pt meet his estimated needs. Per chart, pt remains weight stable since admission but appears to be up ~10lbs from his UBW.    Medications reviewed and include: folic acid, lasix, MVI, insulin, protonix, thiamine  Labs reviewed: Na 129(L), K 4.3 wnl, BUN 25(H) P 3.1 wnl, Mg 2.0 wnl- 3/28 Triglycerides- 64- 3/31 Wbc- 12.6(H) Cbgs- 94, 117, 122 x 24 hrs   UOP-   Drains- 90ml   Diet Order:   Diet Order             Diet regular Room service appropriate? Yes; Fluid consistency: Thin  Diet effective now                   EDUCATION NEEDS:   No education needs have been identified at this time  Skin:  Skin Assessment: Reviewed RN Assessment (incision abdomen)  Last BM:  3/30  Height:   Ht Readings from Last 1 Encounters:  07/13/23 6\' 2"  (1.88 m)    Weight:   Wt Readings from Last 1 Encounters:  07/26/23 (!) 174.2 kg    Ideal Body Weight:  86.3 kg  BMI:  Body mass index is 49.31 kg/m.  Estimated Nutritional Needs:   Kcal:  2700-3000kcal/day  Protein:  135-150g/day  Fluid:  2.7-3.0L/day  Betsey Holiday MS, RD, LDN If unable to be reached, please send secure chat to "RD inpatient" available from 8:00a-4:00p daily

## 2023-07-27 NOTE — Progress Notes (Signed)
 Occupational Therapy Treatment Patient Details Name: [name] MRN: [id number] DOB: 1965/09/23 Today's Date: 07/27/2023   History of present illness Patient is a 58 year old male here for elective  inguinal hernia repair. Had bleeding  required electrocautery and ligature for hemostasis. After getting blood products, patient deteriorated, treated for shock and hypoxemia. Had rapid response with small bowel evisceration secondary to fascial dehiscence requiring surgery for evisercation and retention suture placement. Requiring pressor support.   OT comments  Upon entering the room, pt supine in bed and agreeable to OT intervention. Abdominal binder donned before exiting the bed. Min A for L LE to get to EOB. Pt stands and takes several steps forward to sink for oral hygiene. Pt needing CGA for balance and standing for ~ 3-4 minutes with set up A to obtain needed items to brush teeth. Pt returns to sit on EOB to rest. RN arrives and pt stands for 4-5 minutes for skin check. Sit >supine with max A for trunk support and B LE to return to bed. RN remains in room working with pt. Call bell and all needed items within reach upon exiting the room.       If plan is discharge home, recommend the following:  A lot of help with bathing/dressing/bathroom;Help with stairs or ramp for entrance;Assist for transportation;Assistance with cooking/housework;A little help with walking and/or transfers   Equipment Recommendations  BSC/3in1       Precautions / Restrictions Precautions Precautions: Fall Recall of Precautions/Restrictions: Intact Precaution/Restrictions Comments: abdominal binder       Mobility Bed Mobility Overal bed mobility: Needs Assistance Bed Mobility: Supine to Sit, Sit to Supine     Supine to sit: Min assist, HOB elevated, Used rails Sit to supine: Max assist        Transfers Overall transfer level: Needs assistance Equipment used: 1 person hand held assist Transfers: Sit  to/from Stand Sit to Stand: Min assist                 Balance Overall balance assessment: Needs assistance Sitting-balance support: Feet supported Sitting balance-Leahy Scale: Good     Standing balance support: Bilateral upper extremity supported, Reliant on assistive device for balance Standing balance-Leahy Scale: Fair                             ADL either performed or assessed with clinical judgement   ADL Overall ADL's : Needs assistance/impaired       Grooming Details (indicate cue type and reason): Min A to stand at sink for brush teeth with set up A to obtain all needed items                                    Extremity/Trunk Assessment Upper Extremity Assessment Upper Extremity Assessment: Generalized weakness   Lower Extremity Assessment Lower Extremity Assessment: Generalized weakness                 Communication Communication Communication: No apparent difficulties   Cognition Arousal: Alert Behavior During Therapy: WFL for tasks assessed/performed Cognition: No apparent impairments                               Following commands: Impaired Following commands impaired: Follows one step commands with increased time      Cueing  Cueing Techniques: Verbal cues, Visual cues, Tactile cues             Pertinent Vitals/ Pain       Pain Assessment Pain Assessment: Faces Faces Pain Scale: Hurts little more Pain Location: abdomen Pain Descriptors / Indicators: Discomfort Pain Intervention(s): Repositioned, Other (comment) (abdominal binder donned)         Frequency  Min 3X/week        Progress Toward Goals  OT Goals(current goals can now be found in the care plan section)  Progress towards OT goals: Progressing toward goals      AM-PAC OT "6 Clicks" Daily Activity     Outcome Measure   Help from another person eating meals?: None Help from another person taking care of personal  grooming?: A Little Help from another person toileting, which includes using toliet, bedpan, or urinal?: A Lot Help from another person bathing (including washing, rinsing, drying)?: A Lot Help from another person to put on and taking off regular upper body clothing?: A Little Help from another person to put on and taking off regular lower body clothing?: A Lot 6 Click Score: 16    End of Session Equipment Utilized During Treatment: Oxygen  OT Visit Diagnosis: Other abnormalities of gait and mobility (R26.89);Muscle weakness (generalized) (M62.81);Pain   Activity Tolerance Patient tolerated treatment well   Patient Left with call bell/phone within reach;in chair;with chair alarm set;with nursing/sitter in room   Nurse Communication Mobility status        Time: 9562-1308 OT Time Calculation (min): 26 min  Charges: OT General Charges $OT Visit: 1 Visit OT Treatments $Self Care/Home Management : 23-37 mins  Jackquline Denmark, MS, OTR/L , CBIS ascom [phone]  07/27/23, 4:28 PM

## 2023-07-27 NOTE — Progress Notes (Signed)
 Subjective:  CC: [name] is a 58 y.o. male  Hospital stay day 14, 10 Days Post-Op evisercation and retention suture placement  HPI: No acute issues reported overnight.  Tolerating regular diet and bowel movement reported.  ROS:  General: Denies weight loss, weight gain, fatigue, fevers, chills, and night sweats. Heart: Denies chest pain, palpitations, racing heart, irregular heartbeat, leg pain or swelling, and decreased activity tolerance. Respiratory: Denies breathing difficulty, shortness of breath, wheezing, cough, and sputum. GI: Denies change in appetite, heartburn, nausea, vomiting, constipation, diarrhea, and blood in stool. GU: Denies difficulty urinating, pain with urinating, urgency, frequency, blood in urine.   Objective:   Temp:  [98.3 F (36.8 C)-99.9 F (37.7 C)] 98.3 F (36.8 C) (04/01 0832) Pulse Rate:  [98-105] 105 (04/01 0832) Resp:  [19-20] 20 (04/01 0832) BP: (114-130)/(63-75) 130/75 (04/01 0832) SpO2:  [93 %-96 %] 94 % (04/01 0832)     Height: 6\' 2"  (188 cm) Weight: (!) 174.2 kg BMI (Calculated): 49.29   Intake/Output this shift:   Intake/Output Summary (Last 24 hours) at 07/27/2023 1133 Last data filed at 07/27/2023 1057 Gross per 24 hour  Intake 700 ml  Output 1410 ml  Net -710 ml    Constitutional :  alert, cooperative, appears stated age, and mild distress  Respiratory:  clear to auscultation bilaterally  Cardiovascular:  Tachy rate  Gastrointestinal: soft, non-tender; bowel sounds normal; no masses,  no organomegaly. JP with serosanguinous output  Skin: Cool and moist. Retention sutures intact  Psychiatric: Normal affect, non-agitated, not confused       LABS:     Latest Ref Rng & Units 07/27/2023    4:37 AM 07/26/2023    5:58 AM 07/25/2023    5:50 AM  CMP  Glucose 70 - 99 mg/dL 161  81  096   BUN 6 - 20 mg/dL 25  26  27    Creatinine 0.61 - 1.24 mg/dL 0.45  4.09  8.11   Sodium 135 - 145 mmol/L 129  132  132   Potassium 3.5 - 5.1 mmol/L  4.3  5.1  5.3   Chloride 98 - 111 mmol/L 98  98  100   CO2 22 - 32 mmol/L 25  28  26    Calcium 8.9 - 10.3 mg/dL 8.1  8.7  8.5   Total Protein 6.5 - 8.1 g/dL  6.0  5.5   Total Bilirubin 0.0 - 1.2 mg/dL  2.1  1.6   Alkaline Phos 38 - 126 U/L  75  73   AST 15 - 41 U/L  73  45   ALT 0 - 44 U/L  33  26       Latest Ref Rng & Units 07/27/2023    4:37 AM 07/25/2023    5:50 AM 07/23/2023    8:00 AM  CBC  WBC 4.0 - 10.5 K/uL 12.6  21.0  19.6   Hemoglobin 13.0 - 17.0 g/dL 91.4  78.2  95.6   Hematocrit 39.0 - 52.0 % 34.1  33.0  35.6   Platelets 150 - 400 K/uL 161  128  113     RADS: CLINICAL DATA:  Ileus.   EXAM: PORTABLE ABDOMEN - 1 VIEW   COMPARISON:  July 17, 2023.   FINDINGS: Stable small bowel dilatation is noted concerning for ileus or distal small bowel obstruction. No colonic dilatation is noted. Midline surgical staples are noted. Surgical drain is noted in the epigastric region.   IMPRESSION: Small bowel dilatation is noted  concerning for postoperative ileus or distal small bowel obstruction. Surgical drain is noted in epigastric region.     Electronically Signed   By: Lupita Raider M.D.   On: 07/22/2023 11:46   Assessment:   S/p exlap, subsequent evisceration, retention suture placement.  Tolerating regular diet with reported bowel movement.  JP removed today with no issues.  No further intervention needed from surgical standpoint.  Follow-up in office next week for retention suture and staple removal.  Further care per hospitalist team.   labs/images/medications/previous chart entries reviewed personally and relevant changes/updates noted above.

## 2023-07-27 NOTE — Assessment & Plan Note (Signed)
 Patient states his normal rate is around 350 pounds.  Patient was as high as 395.95 pounds.  Started diuresis.  Continue to monitor weights.

## 2023-07-27 NOTE — Progress Notes (Signed)
 Progress Note   Patient: [name] ZOX:[id number] DOB: 04-02-1966 DOA: 07/13/2023     14 DOS: the patient was seen and examined on 07/27/2023   Brief hospital course: 58 yo M with hx of obesity , OSA, , EtOH abuse hx of admission to hospital for ETOH withdrawal. He came in for elective surgery to have inguinal hernia repair with lysis of adhesions. He had significant bleeding per surgeon which was unusual and required electrocautery and ligature for hemostasis. He was brought back with abdominal JP drain and had FFP and platelets ordered due to intraoperative bleeding. After receiving these blood products patient deteriorated and developed circulatory shock, hypoxemia, swelling worse on lower extremities. He had code RRT called and was emergently brought to ICU due to shock with hypoxemia. He was treated for anaphylactic non febrile blood product reaction vs TRALI and improved overnight. He became stable and was downgraded to medical floor.   3/18 patient had bilateral inguinal hernia repair plus lysis of adhesions and exploratory laparotomy. 3/22.  Patient was brought to the operating room by Dr. Aleen Campi for fascial dehiscence with small bowel evisceration and handout exploratory laparotomy with abdominal washout and abdominal closure with retention sutures.  07/17/23- patient had code RRT overnight, he had findings of small bowel evisceration secondary to fascial dehiscence.  This likely stemmed from forceful cough per surgery service.  He became unstable in distress and required endotracheal intubation.  He was taken to OR emergently for ex-lap and closure of abdominal cavity.  Daughter was available to come in and we reviewed medical plan. CVl placed   07/18/23- extubated but reports severe pain. Daughter at bedside and patient is improved able to speak in full sentences.  Reviewed case with surgery and placed abdominal binder.  He is on CIWA due to reported daily drinking.    3/24 lethargic but  arousable, remains  on pressors   3/25 remains on pressors, foley in place.  Central line removed and PICC line placed  3/26.  Medical team took over.  Foley catheter removed.  Patient spiked a fever of 101.  Blood cultures, chest x-ray, urine analysis and urine culture (after Foley removed).  Patient already on fluconazole and meropenem. 3/27.  Since MRSA PCR came back negative vancomycin was discontinued.  NG tube came out and needed to be replaced. 3/28.  Patient started on clear liquid diet.  Still has NG tube. 3/29.  Will continue IV Lasix on a daily basis with weight gain up to 395 pounds.  Patient states that he normally is around 350. 3/30.  Right arm swelling.  Ultrasound showed no DVT.  PICC line removed.  TPN stopped.  Started Lovenox injections. 3/31.  CT scan of the abdomen pelvis shows postoperative ileus.  CT scan of the chest shows pneumonia.  Still on meropenem.  Nystatin powder under skin folds.  Placed on solid food by general surgery 4/1.  Patient did have a poor night of sleep and is sleepy today.  White blood cell count down to 12.6 and procalcitonin down to 0.35.  Will discontinue meropenem today and monitor.  Change Lovenox injections over to Eliquis for this evening.     Assessment and Plan: * Septic shock (HCC) Secondary to abdominal wound dehiscence.  Patient completed meropenem on 4/1 and completed Diflucan on 3/29.  Fever On 3/26.  No recurrence.  Chest x-ray showing multifocal pneumonia.  Already on meropenem.  Central line removed 3/25 and PICC line placed 3/25.  Foley catheter removed 3/25.  Blood cultures are negative for 5 days.  White blood cell count trended down 12.6.  Procalcitonin 0.35.  Will stop meropenem today  Perioperative dehiscence of abdominal wound with evisceration Completed antibiotics.  Discontinued TPN after PICC line removal.  Patient had NG tube removed on 3/29.  On clear liquid diet on 3/30.  General surgery advance diet to solid food on  3/31.  General surgery will follow-up as outpatient.  Arm DVT (deep venous thromboembolism), acute, right (HCC) PICC line removed on 3/30.  Initially started on Lovenox injections but will switch over to Eliquis on the evening of 4/1.  Skin rash Patient has some sloughing of the skin underneath skin folds and in the armpit.  Will use nystatin powder.  Hyponatremia Sodium went down to 129.  Patient still wants to continue with the Lasix to diurese fluid.  Hyperkalemia Improved with 1 dose of Lokelma  Weight gain Patient states his normal rate is around 350 pounds.  Patient was as high as 395.95 pounds.  Started diuresis.  Continue to monitor weights.  Morbid obesity with BMI of 50.0-59.9, adult [facility]) Patient has had quite a bit of weight gain during the hospital course received quite a bit of fluids TPN and antibiotics.  Continue twice daily Lasix with albumin in the morning.  BMI down to 49.31.  AKI (acute kidney injury) (HCC) Resolved.  Creatinine 1.29 on 3/19.  Creatinine went as low to 0.7.  Current creatinine 0.89 but receiving IV Lasix twice daily  History of alcohol abuse On thiamine and folic acid  Acute respiratory failure with hypoxia and hypercapnia (HCC) Patient on 4 L of oxygen.  Required intubation and extubation during the hospital course.  Continue Lasix to twice daily dosing with albumin in the morning.  Try to see if we can get off oxygen.  Thrombocytopenia (HCC) Platelet count recovered to 161.  TRALI (transfusion related acute lung injury) During the hospital course.  Inguinal hernia Initial surgery.        Subjective: Patient had a poor night of sleep and was sleepy this morning but able to wake up and answer some questions.  Patient admitted 14 days ago after inguinal hernia.  Had wound dehiscence and a second surgery.  General surgery will follow-up as outpatient.  Physical Exam: Vitals:   07/27/23 0453 07/27/23 0739 07/27/23 0832 07/27/23 1529   BP: 114/64  130/75 126/77  Pulse: 98  (!) 105 (!) 106  Resp: 20  20 20   Temp: 98.3 F (36.8 C)  98.3 F (36.8 C) 98.7 F (37.1 C)  TempSrc: Oral  Oral Oral  SpO2: 96% 93% 94% 92%  Weight:      Height:       Physical Exam HENT:     Head: Normocephalic.     Mouth/Throat:     Pharynx: No oropharyngeal exudate.  Eyes:     General: Lids are normal.     Conjunctiva/sclera: Conjunctivae normal.  Cardiovascular:     Rate and Rhythm: Normal rate and regular rhythm.     Heart sounds: Normal heart sounds, S1 normal and S2 normal.  Pulmonary:     Breath sounds: Examination of the right-lower field reveals decreased breath sounds and rhonchi. Examination of the left-lower field reveals decreased breath sounds and rhonchi. Decreased breath sounds and rhonchi present. No wheezing or rales.  Abdominal:     Palpations: Abdomen is soft.     Tenderness: There is no abdominal tenderness.  Musculoskeletal:     Right lower  leg: No swelling.     Left lower leg: No swelling.  Skin:    General: Skin is warm.     Findings: No rash.     Comments: Chronic brownish discoloration lower extremities.  Neurological:     Mental Status: He is alert.     Data Reviewed: White blood cell count down to 12.6, procalcitonin 0.35, hemoglobin 11.8, platelet count 161, creatinine 0.89, sodium 129  Family Communication: Daughter at bedside  Disposition: Status is: Inpatient Remains inpatient appropriate because: Continue diuresis  Planned Discharge Destination: Rehab    Time spent: 28 minutes  Author: Alford Highland, MD 07/27/2023 3:32 PM  For on call review [email or web address].

## 2023-07-28 DIAGNOSIS — R6521 Severe sepsis with septic shock: Secondary | ICD-10-CM | POA: Diagnosis not present

## 2023-07-28 DIAGNOSIS — A419 Sepsis, unspecified organism: Secondary | ICD-10-CM | POA: Diagnosis not present

## 2023-07-28 LAB — GLUCOSE, CAPILLARY
Glucose-Capillary: 104 mg/dL — ABNORMAL HIGH (ref 70–99)
Glucose-Capillary: 113 mg/dL — ABNORMAL HIGH (ref 70–99)
Glucose-Capillary: 95 mg/dL (ref 70–99)
Glucose-Capillary: 96 mg/dL (ref 70–99)
Glucose-Capillary: 98 mg/dL (ref 70–99)

## 2023-07-28 LAB — BASIC METABOLIC PANEL WITH GFR
Anion gap: 6 (ref 5–15)
BUN: 23 mg/dL — ABNORMAL HIGH (ref 6–20)
CO2: 28 mmol/L (ref 22–32)
Calcium: 8 mg/dL — ABNORMAL LOW (ref 8.9–10.3)
Chloride: 93 mmol/L — ABNORMAL LOW (ref 98–111)
Creatinine, Ser: 0.76 mg/dL (ref 0.61–1.24)
GFR, Estimated: >60 mL/min (ref >60)
Glucose, Bld: 102 mg/dL — ABNORMAL HIGH (ref 70–99)
Potassium: 4.2 mmol/L (ref 3.5–5.1)
Sodium: 127 mmol/L — ABNORMAL LOW (ref 135–145)

## 2023-07-28 LAB — CBC
HCT: 34.6 % — ABNORMAL LOW (ref 39.0–52.0)
Hemoglobin: 11.8 g/dL — ABNORMAL LOW (ref 13.0–17.0)
MCH: 34.2 pg — ABNORMAL HIGH (ref 26.0–34.0)
MCHC: 34.1 g/dL (ref 30.0–36.0)
MCV: 100.3 fL — ABNORMAL HIGH (ref 80.0–100.0)
Platelets: 195 10*3/uL (ref 150–400)
RBC: 3.45 MIL/uL — ABNORMAL LOW (ref 4.22–5.81)
RDW: 14.4 % (ref 11.5–15.5)
WBC: 9.8 10*3/uL (ref 4.0–10.5)
nRBC: 0 % (ref 0.0–0.2)

## 2023-07-28 MED ORDER — ALBUMIN HUMAN 25 % IV SOLN
12.5000 g | Freq: Four times a day (QID) | INTRAVENOUS | Status: AC
  Administered 2023-07-28 – 2023-08-01 (×16): 12.5 g via INTRAVENOUS
  Filled 2023-07-28 (×16): qty 50

## 2023-07-28 MED ORDER — METOLAZONE 2.5 MG PO TABS
2.5000 mg | ORAL_TABLET | Freq: Once | ORAL | Status: AC
  Administered 2023-07-28: 2.5 mg via ORAL
  Filled 2023-07-28: qty 1

## 2023-07-28 NOTE — Plan of Care (Signed)
   Problem: Education: Goal: Knowledge of General Education information will improve Description Including pain rating scale, medication(s)/side effects and non-pharmacologic comfort measures Outcome: Progressing

## 2023-07-28 NOTE — Progress Notes (Signed)
 PT Cancellation Note  Patient Details Name: [name] MRN: [id number] DOB: 1965/10/21   Cancelled Treatment:    Reason Eval/Treat Not Completed: Other (comment) Patient and daughter report that they have been waiting over an hour to be cleaned up by nursing staff after bowel movement. Patient wanting to hold on therapy until after cleaned up and eating lunch. NT and RN other than patient's RN notified. PT will re-attempt at later date/time.   Maylon Peppers, PT, DPT Physical Therapist - [facility]  [facility]    Georgina Krist A [name] 07/28/2023, 2:36 PM

## 2023-07-28 NOTE — Progress Notes (Signed)
 PROGRESS NOTE    [name]  RUE:[id number] DOB: 01-25-1966 DOA: 07/13/2023 PCP: Ailene Ravel, MD    Brief Narrative:  58 yo M with hx of obesity , OSA, , EtOH abuse hx of admission to hospital for ETOH withdrawal. He came in for elective surgery to have inguinal hernia repair with lysis of adhesions. He had significant bleeding per surgeon which was unusual and required electrocautery and ligature for hemostasis. He was brought back with abdominal JP drain and had FFP and platelets ordered due to intraoperative bleeding. After receiving these blood products patient deteriorated and developed circulatory shock, hypoxemia, swelling worse on lower extremities. He had code RRT called and was emergently brought to ICU due to shock with hypoxemia. He was treated for anaphylactic non febrile blood product reaction vs TRALI and improved overnight. He became stable and was downgraded to medical floor.    3/18 patient had bilateral inguinal hernia repair plus lysis of adhesions and exploratory laparotomy. 3/22.  Patient was brought to the operating room by Dr. Aleen Campi for fascial dehiscence with small bowel evisceration and handout exploratory laparotomy with abdominal washout and abdominal closure with retention sutures.   07/17/23- patient had code RRT overnight, he had findings of small bowel evisceration secondary to fascial dehiscence.  This likely stemmed from forceful cough per surgery service.  He became unstable in distress and required endotracheal intubation.  He was taken to OR emergently for ex-lap and closure of abdominal cavity.  Daughter was available to come in and we reviewed medical plan. CVl placed   07/18/23- extubated but reports severe pain. Daughter at bedside and patient is improved able to speak in full sentences.  Reviewed case with surgery and placed abdominal binder.  He is on CIWA due to reported daily drinking.    3/24 lethargic but arousable, remains  on pressors    3/25 remains on pressors, foley in place.  Central line removed and PICC line placed   3/26.  Medical team took over.  Foley catheter removed.  Patient spiked a fever of 101.  Blood cultures, chest x-ray, urine analysis and urine culture (after Foley removed).  Patient already on fluconazole and meropenem. 3/27.  Since MRSA PCR came back negative vancomycin was discontinued.  NG tube came out and needed to be replaced. 3/28.  Patient started on clear liquid diet.  Still has NG tube. 3/29.  Will continue IV Lasix on a daily basis with weight gain up to 395 pounds.  Patient states that he normally is around 350. 3/30.  Right arm swelling.  Ultrasound showed no DVT.  PICC line removed.  TPN stopped.  Started Lovenox injections. 3/31.  CT scan of the abdomen pelvis shows postoperative ileus.  CT scan of the chest shows pneumonia.  Still on meropenem.  Nystatin powder under skin folds.  Placed on solid food by general surgery 4/1.  Patient did have a poor night of sleep and is sleepy today.  White blood cell count down to 12.6 and procalcitonin down to 0.35.  Will discontinue meropenem today and monitor.  Change Lovenox injections over to Eliquis for this evening.  Assessment & Plan:   Principal Problem:   Septic shock (HCC) Active Problems:   Fever   Perioperative dehiscence of abdominal wound with evisceration   Arm DVT (deep venous thromboembolism), acute, right (HCC)   Ventral hernia without obstruction or gangrene   Inguinal hernia   Anaphylactic reaction   TRALI (transfusion related acute lung injury)  Thrombocytopenia (HCC)   Acute respiratory failure with hypoxia and hypercapnia (HCC)   History of alcohol abuse   AKI (acute kidney injury) (HCC)   Morbid obesity with BMI of 50.0-59.9, adult (HCC)   Weight gain   Hyperkalemia   Hyponatremia   Swelling of arm   Skin rash  Anasarca/fluid overload Hyponatremia, suspect hypovolemic Patient with significant pitting edema in  bilateral lower extremities and right upper extremity.  Likely multifactorial in nature.  Has been receiving IV Lasix.  Serum sodium slowly trending down.  Likely intravascularly depleted Plan: Continue Lasix 40 mg IV twice daily Monitor serum sodium Add metolazone 2.5 mg p.o. daily Add albumin 12.5 g every 6 hours Repeat metabolites in a.m.  * Septic shock (HCC) Secondary to abdominal wound dehiscence.  Patient completed meropenem on 4/1 and completed Diflucan on 3/29.   Fever On 3/26.  No recurrence.  Chest x-ray showing multifocal pneumonia.  Already on meropenem.  Central line removed 3/25 and PICC line placed 3/25.  Foley catheter removed 3/25.  Blood cultures are negative for 5 days.  White blood cell count trended down 12.6.  Procalcitonin 0.35.  Meropenem stopped.  Continue monitoring vitals and fever curve   Perioperative dehiscence of abdominal wound with evisceration Completed antibiotics.  Discontinued TPN after PICC line removal.  Patient had NG tube removed on 3/29.  On clear liquid diet on 3/30.  General surgery advance diet to solid food on 3/31.  General surgery will follow-up as outpatient.   Arm DVT (deep venous thromboembolism), acute, right (HCC) PICC line removed on 3/30.  Initially started on Lovenox.  Now on p.o. Eliquis and tolerating.   Skin rash Patient has some sloughing of the skin underneath skin folds and in the armpit.  Will use nystatin powder.     Hyperkalemia Improved with 1 dose of Lokelma   Weight gain Patient states his normal rate is around 350 pounds.  Patient was as high as 395.95 pounds.  Started diuresis.  Continue to monitor weights.   Morbid obesity with BMI of 50.0-59.9, adult [facility]) Patient has had quite a bit of weight gain during the hospital course received quite a bit of fluids TPN and antibiotics.     AKI (acute kidney injury) (HCC) Resolved.  Creatinine 1.29 on 3/19.  Creatinine went as low to 0.7.  Current creatinine 0.89 but  receiving IV Lasix twice daily   History of alcohol abuse On thiamine and folic acid   Acute respiratory failure with hypoxia and hypercapnia (HCC) Patient on 4 L of oxygen.  Required intubation and extubation during the hospital course.  Continue Lasix to twice daily dosing with albumin in the morning.  Try to see if we can get off oxygen.   Thrombocytopenia (HCC) Platelet count recovered to reference range   TRALI (transfusion related acute lung injury) During the hospital course.   Inguinal hernia Initial surgery.   DVT prophylaxis: Eliquis Code Status: Full Family Communication: Daughter at bedside 4/2 Disposition Plan: Status is: Inpatient Remains inpatient appropriate because: Anasarca/fluid overload on IV diuresis   Level of care: Med-Surg  Consultants:  None  Procedures:  Exploratory laparotomy  Antimicrobials: None   Subjective: Seen and examined.  Resting in bed.  Main complaint of fluid retention.  Objective: Vitals:   07/28/23 0428 07/28/23 0432 07/28/23 0729 07/28/23 0816  BP:  130/71  130/71  Pulse:  89  98  Resp:  18  19  Temp:  98.4 F (36.9 C)  98.5 F (  36.9 C)  TempSrc:      SpO2:  97% 93% 94%  Weight: (!) 191.6 kg     Height:        Intake/Output Summary (Last 24 hours) at 07/28/2023 1311 Last data filed at 07/28/2023 1029 Gross per 24 hour  Intake 680 ml  Output 1000 ml  Net -320 ml   Filed Weights   07/25/23 0500 07/26/23 0500 07/28/23 0428  Weight: (!) 176.5 kg (!) 174.2 kg (!) 191.6 kg    Examination:  General exam: Appears fatigued chronically ill Respiratory system: Clear to auscultation. Respiratory effort normal. Cardiovascular system: S1-S2, distant heart sounds, no appreciable murmurs, 3+ pitting edema BLE Gastrointestinal system: Obese, soft, NT/ND, decreased bowel sounds Central nervous system: Alert and oriented. No focal neurological deficits. Extremities: Bilateral lower extremities edematous.  Right upper  extremity edematous. Skin: No rashes, lesions or ulcers Psychiatry: Judgement and insight appear normal. Mood & affect appropriate.     Data Reviewed: I have personally reviewed following labs and imaging studies  CBC: Recent Labs  Lab 07/23/23 0800 07/25/23 0550 07/27/23 0437 07/28/23 0536  WBC 19.6* 21.0* 12.6* 9.8  NEUTROABS  --   --  9.5*  --   HGB 11.9* 11.2* 11.8* 11.8*  HCT 35.6* 33.0* 34.1* 34.6*  MCV 102.6* 101.2* 99.7 100.3*  PLT 113* 128* 161 195   Basic Metabolic Panel: Recent Labs  Lab 07/23/23 0800 07/25/23 0550 07/26/23 0558 07/27/23 0437 07/28/23 0536  NA 138 132* 132* 129* 127*  K 4.6 5.3* 5.1 4.3 4.2  CL 105 100 98 98 93*  CO2 29 26 28 25 28   GLUCOSE 129* 107* 81 101* 102*  BUN 25* 27* 26* 25* 23*  CREATININE 0.70 0.80 0.90 0.89 0.76  CALCIUM 8.6* 8.5* 8.7* 8.1* 8.0*  MG 2.0  --   --   --   --   PHOS 3.1  --   --   --   --    GFR: Estimated Creatinine Clearance: 185.9 mL/min (by C-G formula based on SCr of 0.76 mg/dL). Liver Function Tests: Recent Labs  Lab 07/22/23 0616 07/25/23 0550 07/26/23 0558  AST 29 45* 73*  ALT 16 26 33  ALKPHOS 80 73 75  BILITOT 1.4* 1.6* 2.1*  PROT 5.2* 5.5* 6.0*  ALBUMIN 1.8* 1.6* 1.7*   No results for input(s): "LIPASE", "AMYLASE" in the last 168 hours. No results for input(s): "AMMONIA" in the last 168 hours. Coagulation Profile: Recent Labs  Lab 07/25/23 1722  INR 1.4*   Cardiac Enzymes: No results for input(s): "CKTOTAL", "CKMB", "CKMBINDEX", "TROPONINI" in the last 168 hours. BNP (last 3 results) No results for input(s): "PROBNP" in the last 8760 hours. HbA1C: No results for input(s): "HGBA1C" in the last 72 hours. CBG: Recent Labs  Lab 07/27/23 1709 07/27/23 2251 07/28/23 0136 07/28/23 0501 07/28/23 1113  GLUCAP 94 113* 98 96 113*   Lipid Profile: Recent Labs    07/26/23 0558  TRIG 64   Thyroid Function Tests: No results for input(s): "TSH", "T4TOTAL", "FREET4", "T3FREE",  "THYROIDAB" in the last 72 hours. Anemia Panel: No results for input(s): "VITAMINB12", "FOLATE", "FERRITIN", "TIBC", "IRON", "RETICCTPCT" in the last 72 hours. Sepsis Labs: Recent Labs  Lab 07/27/23 0437  PROCALCITON 0.35    Recent Results (from the past 240 hours)  Culture, blood (Routine X 2) w Reflex to ID Panel     Status: None   Collection Time: 07/21/23  1:42 PM   Specimen: BLOOD  Result Value Ref  Range Status   Specimen Description BLOOD BLOOD LEFT HAND  Final   Special Requests   Final    BOTTLES DRAWN AEROBIC AND ANAEROBIC Blood Culture adequate volume   Culture   Final    NO GROWTH 5 DAYS Performed at [facility], [address] [place], [place] [address]    Report Status 07/26/2023 FINAL  Final  Culture, blood (Routine X 2) w Reflex to ID Panel     Status: None   Collection Time: 07/21/23  1:52 PM   Specimen: BLOOD  Result Value Ref Range Status   Specimen Description BLOOD BLOOD LEFT HAND  Final   Special Requests   Final    BOTTLES DRAWN AEROBIC AND ANAEROBIC Blood Culture adequate volume   Culture   Final    NO GROWTH 5 DAYS Performed at [facility], [address], [place], [place] [address]    Report Status 07/26/2023 FINAL  Final  MRSA Next Gen by PCR, Nasal     Status: None   Collection Time: 07/22/23  9:21 AM   Specimen: Nasal Mucosa; Nasal Swab  Result Value Ref Range Status   MRSA by PCR Next Gen NOT DETECTED NOT DETECTED Final    Comment: (NOTE) The GeneXpert MRSA Assay (FDA approved for NASAL specimens only), is one component of a comprehensive MRSA colonization surveillance program. It is not intended to diagnose MRSA infection nor to guide or monitor treatment for MRSA infections. Test performance is not FDA approved in patients less than 62 years old. Performed at [facility], [address] [place], [place] [address]          Radiology Studies: No results found.      Scheduled Meds:  apixaban   10 mg Oral BID   Followed by   Melene Muller ON 08/03/2023] apixaban  5 mg Oral BID   chlorpheniramine-HYDROcodone  10 mL Oral TID   feeding supplement  237 mL Oral TID BM   folic acid  1 mg Oral Daily   furosemide  40 mg Intravenous BID   insulin aspart  0-9 Units Subcutaneous Q6H   ipratropium-albuterol  3 mL Nebulization TID   metolazone  2.5 mg Oral Once   multivitamin with minerals  1 tablet Oral Daily   nystatin   Topical TID   pantoprazole (PROTONIX) IV  40 mg Intravenous Q24H   thiamine  100 mg Oral Daily   Or   thiamine  100 mg Intravenous Daily   Continuous Infusions:  albumin human       LOS: 15 days   Tresa Moore, MD [facility]   If 7PM-7AM, please contact night-coverage  07/28/2023, 1:11 PM

## 2023-07-28 NOTE — TOC Progression Note (Addendum)
 Transition of Care [facility]) - Progression Note    Patient Details  Name: [name] MRN: [id number] Date of Birth: 09-10-1965  [facility]) CM/SW Contact  Margarito Liner, LCSW Phone Number: 07/28/2023, 9:42 AM  Clinical Narrative:  No bed offers this morning. Sent updated therapy notes to facilities that have not responded yet.   3:13 pm: CSW spoke to [employer]. She stated that [facility] in [place] is in network. CSW spoke to admissions coordinator and faxed referral for review.  Expected Discharge Plan and Services                                               Social Determinants of Health (SDOH) Interventions SDOH Screenings   Food Insecurity: No Food Insecurity (07/14/2023)  Housing: Low Risk  (07/14/2023)  Transportation Needs: No Transportation Needs (07/14/2023)  Utilities: Not At Risk (07/14/2023)  Tobacco Use: High Risk (07/13/2023)    Readmission Risk Interventions     No data to display

## 2023-07-29 DIAGNOSIS — R6521 Severe sepsis with septic shock: Secondary | ICD-10-CM | POA: Diagnosis not present

## 2023-07-29 DIAGNOSIS — A419 Sepsis, unspecified organism: Secondary | ICD-10-CM | POA: Diagnosis not present

## 2023-07-29 LAB — CBC WITH DIFFERENTIAL/PLATELET
Abs Immature Granulocytes: 0.02 10*3/uL (ref 0.00–0.07)
Basophils Absolute: 0.1 10*3/uL (ref 0.0–0.1)
Basophils Relative: 1 %
Eosinophils Absolute: 0.4 10*3/uL (ref 0.0–0.5)
Eosinophils Relative: 5 %
HCT: 32.3 % — ABNORMAL LOW (ref 39.0–52.0)
Hemoglobin: 11.1 g/dL — ABNORMAL LOW (ref 13.0–17.0)
Immature Granulocytes: 0 %
Lymphocytes Relative: 28 %
Lymphs Abs: 2.2 10*3/uL (ref 0.7–4.0)
MCH: 34.6 pg — ABNORMAL HIGH (ref 26.0–34.0)
MCHC: 34.4 g/dL (ref 30.0–36.0)
MCV: 100.6 fL — ABNORMAL HIGH (ref 80.0–100.0)
Monocytes Absolute: 0.8 10*3/uL (ref 0.1–1.0)
Monocytes Relative: 11 %
Neutro Abs: 4.5 10*3/uL (ref 1.7–7.7)
Neutrophils Relative %: 55 %
Platelets: 172 10*3/uL (ref 150–400)
RBC: 3.21 MIL/uL — ABNORMAL LOW (ref 4.22–5.81)
RDW: 14 % (ref 11.5–15.5)
Smear Review: NORMAL
WBC Morphology: INCREASED
WBC: 7.9 10*3/uL (ref 4.0–10.5)
nRBC: 0 % (ref 0.0–0.2)

## 2023-07-29 LAB — BASIC METABOLIC PANEL WITH GFR
Anion gap: 7 (ref 5–15)
BUN: 20 mg/dL (ref 6–20)
CO2: 28 mmol/L (ref 22–32)
Calcium: 8.2 mg/dL — ABNORMAL LOW (ref 8.9–10.3)
Chloride: 95 mmol/L — ABNORMAL LOW (ref 98–111)
Creatinine, Ser: 0.66 mg/dL (ref 0.61–1.24)
GFR, Estimated: >60 mL/min (ref >60)
Glucose, Bld: 99 mg/dL (ref 70–99)
Potassium: 3.8 mmol/L (ref 3.5–5.1)
Sodium: 130 mmol/L — ABNORMAL LOW (ref 135–145)

## 2023-07-29 LAB — GLUCOSE, CAPILLARY
Glucose-Capillary: 110 mg/dL — ABNORMAL HIGH (ref 70–99)
Glucose-Capillary: 105 mg/dL — ABNORMAL HIGH (ref 70–99)
Glucose-Capillary: 111 mg/dL — ABNORMAL HIGH (ref 70–99)
Glucose-Capillary: 111 mg/dL — ABNORMAL HIGH (ref 70–99)
Glucose-Capillary: 91 mg/dL (ref 70–99)

## 2023-07-29 MED ORDER — PANTOPRAZOLE SODIUM 40 MG PO TBEC
40.0000 mg | DELAYED_RELEASE_TABLET | Freq: Every day | ORAL | Status: DC
  Administered 2023-07-29 – 2023-08-04 (×7): 40 mg via ORAL
  Filled 2023-07-29 (×7): qty 1

## 2023-07-29 MED ORDER — METOLAZONE 2.5 MG PO TABS
2.5000 mg | ORAL_TABLET | Freq: Once | ORAL | Status: AC
  Administered 2023-07-29: 2.5 mg via ORAL
  Filled 2023-07-29: qty 1

## 2023-07-29 NOTE — Progress Notes (Signed)
 PT Cancellation Note  Patient Details Name: [name] MRN: [id number] DOB: June 22, 1965   Cancelled Treatment:     PT attempt. "This just is not a good time. I've been up a few times today already and just finished eating lunch. Can you check back later?" Acute PT will continue to follow and progress per current POC.    Rushie Chestnut 07/29/2023, 1:17 PM

## 2023-07-29 NOTE — Progress Notes (Signed)
   07/29/23 0745  Spiritual Encounters  Type of Visit Follow up  Care provided to: Family (Patient's daughter met me in the hallway of floor 1 this am. The chaplain was with her when her dad was in ICU.)  Referral source Chaplain assessment  Reason for visit Routine spiritual support  OnCall Visit Yes  Spiritual Framework  Family Stress Factors Other (Comment) (Pt's daughter is very concerned about her father's mental state and behavior after surgery. She feels he is not progressing but growing worse. Chaplain encouraged her to talk to medical staff about her concerns.)  Interventions  Spiritual Care Interventions Made Established relationship of care and support;Compassionate presence;Reflective listening;Decision-making support/facilitation;Encouragement;Other (comment) (for family)  Intervention Outcomes  Outcomes Connection to spiritual care;Awareness around self/spiritual resourses;Connection to values and goals of care;Autonomy/agency;Awareness of support;Other (comment) (for family)  Spiritual Care Plan  Spiritual Care Issues Still Outstanding Referring to oncoming chaplain for further support

## 2023-07-29 NOTE — Plan of Care (Signed)
 Received report, awake when checked, family present. Aware of fluid precautions. Refusing ensure. Assessment completed. Denies pain at this time. Continue to monitor. Turned q2hrs.  Slept most of night.

## 2023-07-29 NOTE — TOC Progression Note (Signed)
 Transition of Care [facility]) - Progression Note    Patient Details  Name: [name] MRN: [id number] Date of Birth: 04/25/1966  Transition of Care [facility]) CM/SW Contact  Chapman Fitch, RN Phone Number: 07/29/2023, 4:31 PM  Clinical Narrative:     Vm left Colin Mulders at [facility] living to determine if she received referral for review        Expected Discharge Plan and Services                                               Social Determinants of Health (SDOH) Interventions SDOH Screenings   Food Insecurity: No Food Insecurity (07/14/2023)  Housing: Low Risk  (07/14/2023)  Transportation Needs: No Transportation Needs (07/14/2023)  Utilities: Not At Risk (07/14/2023)  Tobacco Use: High Risk (07/13/2023)    Readmission Risk Interventions     No data to display

## 2023-07-29 NOTE — Progress Notes (Signed)
 PROGRESS NOTE    [name]  ZOX:[id number] DOB: 11-02-1965 DOA: 07/13/2023 PCP: Ailene Ravel, MD    Brief Narrative:  58 yo M with hx of obesity , OSA, , EtOH abuse hx of admission to hospital for ETOH withdrawal. He came in for elective surgery to have inguinal hernia repair with lysis of adhesions. He had significant bleeding per surgeon which was unusual and required electrocautery and ligature for hemostasis. He was brought back with abdominal JP drain and had FFP and platelets ordered due to intraoperative bleeding. After receiving these blood products patient deteriorated and developed circulatory shock, hypoxemia, swelling worse on lower extremities. He had code RRT called and was emergently brought to ICU due to shock with hypoxemia. He was treated for anaphylactic non febrile blood product reaction vs TRALI and improved overnight. He became stable and was downgraded to medical floor.    3/18 patient had bilateral inguinal hernia repair plus lysis of adhesions and exploratory laparotomy. 3/22.  Patient was brought to the operating room by Dr. Aleen Campi for fascial dehiscence with small bowel evisceration and handout exploratory laparotomy with abdominal washout and abdominal closure with retention sutures.   07/17/23- patient had code RRT overnight, he had findings of small bowel evisceration secondary to fascial dehiscence.  This likely stemmed from forceful cough per surgery service.  He became unstable in distress and required endotracheal intubation.  He was taken to OR emergently for ex-lap and closure of abdominal cavity.  Daughter was available to come in and we reviewed medical plan. CVl placed   07/18/23- extubated but reports severe pain. Daughter at bedside and patient is improved able to speak in full sentences.  Reviewed case with surgery and placed abdominal binder.  He is on CIWA due to reported daily drinking.    3/24 lethargic but arousable, remains  on pressors    3/25 remains on pressors, foley in place.  Central line removed and PICC line placed   3/26.  Medical team took over.  Foley catheter removed.  Patient spiked a fever of 101.  Blood cultures, chest x-ray, urine analysis and urine culture (after Foley removed).  Patient already on fluconazole and meropenem. 3/27.  Since MRSA PCR came back negative vancomycin was discontinued.  NG tube came out and needed to be replaced. 3/28.  Patient started on clear liquid diet.  Still has NG tube. 3/29.  Will continue IV Lasix on a daily basis with weight gain up to 395 pounds.  Patient states that he normally is around 350. 3/30.  Right arm swelling.  Ultrasound showed no DVT.  PICC line removed.  TPN stopped.  Started Lovenox injections. 3/31.  CT scan of the abdomen pelvis shows postoperative ileus.  CT scan of the chest shows pneumonia.  Still on meropenem.  Nystatin powder under skin folds.  Placed on solid food by general surgery 4/1.  Patient did have a poor night of sleep and is sleepy today.  White blood cell count down to 12.6 and procalcitonin down to 0.35.  Will discontinue meropenem today and monitor.  Change Lovenox injections over to Eliquis for this evening.  Assessment & Plan:   Principal Problem:   Septic shock (HCC) Active Problems:   Fever   Perioperative dehiscence of abdominal wound with evisceration   Arm DVT (deep venous thromboembolism), acute, right (HCC)   Ventral hernia without obstruction or gangrene   Inguinal hernia   Anaphylactic reaction   TRALI (transfusion related acute lung injury)  Thrombocytopenia (HCC)   Acute respiratory failure with hypoxia and hypercapnia (HCC)   History of alcohol abuse   AKI (acute kidney injury) (HCC)   Morbid obesity with BMI of 50.0-59.9, adult (HCC)   Weight gain   Hyperkalemia   Hyponatremia   Swelling of arm   Skin rash  Anasarca/fluid overload Hyponatremia, suspect hypovolemic, improving Patient with significant pitting edema  in bilateral lower extremities and right upper extremity.  Likely multifactorial in nature.  Has been receiving IV Lasix.  Serum sodium started to increase again after addition of metolazone and albumin Plan: Continue Lasix 40 mg IV twice daily Continue metolazone 2.5 mg daily Continue albumin 25% 12.5 g every 6 hours Repeat metabolites in a.m.  * Septic shock (HCC), resolved Secondary to abdominal wound dehiscence.  Patient completed meropenem on 4/1 and completed Diflucan on 3/29.  No shock physiology noted   Fever On 3/26.  No recurrence.  Chest x-ray showing multifocal pneumonia.  Already on meropenem.  Central line removed 3/25 and PICC line placed 3/25.  Foley catheter removed 3/25.  Blood cultures are negative for 5 days.  White blood cell count trended down 12.6.  Procalcitonin 0.35.  Meropenem stopped.  Continue monitoring vitals and fever curve   Perioperative dehiscence of abdominal wound with evisceration Completed antibiotics.  Discontinued TPN after PICC line removal.  Patient had NG tube removed on 3/29.  On clear liquid diet on 3/30.  General surgery advance diet to solid food on 3/31.  Seems to be tolerating.  General surgery will follow-up as outpatient.   Arm DVT (deep venous thromboembolism), acute, right (HCC) PICC line removed on 3/30.  Initially started on Lovenox.  Now on p.o. Eliquis and tolerating.   Skin rash Patient has some sloughing of the skin underneath skin folds and in the armpit.  Will use nystatin powder.     Hyperkalemia Improved with 1 dose of Lokelma   Weight gain Patient states his normal rate is around 350 pounds.  Patient was as high as 395.95 pounds.  Started diuresis.  Continue to monitor weights.   Morbid obesity with BMI of 50.0-59.9, adult [facility]) Patient has had quite a bit of weight gain during the hospital course received quite a bit of fluids TPN and antibiotics.     AKI (acute kidney injury) (HCC) Resolved.  Creatinine 1.29 on 3/19.   Creatinine went as low to 0.7.     History of alcohol abuse On thiamine and folic acid   Acute respiratory failure with hypoxia and hypercapnia (HCC) Patient on 4 L of oxygen.  Required intubation and extubation during the hospital course.  Continue Lasix IV twice daily.  Every 6 hours albumin.  Wean oxygen as tolerated.   Thrombocytopenia (HCC) Platelet count recovered to reference range   TRALI (transfusion related acute lung injury) During the hospital course.   Inguinal hernia Initial surgery.   DVT prophylaxis: Eliquis Code Status: Full Family Communication: Daughter at bedside 4/2, 4/3 Disposition Plan: Status is: Inpatient Remains inpatient appropriate because: Anasarca/fluid overload on IV diuresis   Level of care: Med-Surg  Consultants:  None  Procedures:  Exploratory laparotomy  Antimicrobials: None   Subjective: Seen and examined.  Sitting up in chair.  No visible distress.  Appears fatigued  Objective: Vitals:   07/29/23 0402 07/29/23 0406 07/29/23 0742 07/29/23 0825  BP:  (!) 120/56  (!) 114/56  Pulse:  91  94  Resp:  19  20  Temp:  98.5 F (36.9 C)  98.1 F (36.7 C)  TempSrc:    Oral  SpO2:  95% 91% 93%  Weight: (!) 191.5 kg     Height:        Intake/Output Summary (Last 24 hours) at 07/29/2023 1219 Last data filed at 07/29/2023 1209 Gross per 24 hour  Intake 0 ml  Output 3400 ml  Net -3400 ml   Filed Weights   07/26/23 0500 07/28/23 0428 07/29/23 0402  Weight: (!) 174.2 kg (!) 191.6 kg (!) 191.5 kg    Examination:  General exam: Appears fatigued and chronically ill Respiratory system: Overall clear with bibasilar crackles.  Normal work of breathing.  4 L Cardiovascular system: S1-S2, distant heart sounds, no appreciable murmurs, 3+ pitting edema BLE Gastrointestinal system: Obese, soft, NT/ND, decreased bowel sounds Central nervous system: Alert and oriented. No focal neurological deficits. Extremities: Bilateral lower extremities  edematous.  Right upper extremity edematous. Skin: No rashes, lesions or ulcers Psychiatry: Judgement and insight appear normal. Mood & affect appropriate.     Data Reviewed: I have personally reviewed following labs and imaging studies  CBC: Recent Labs  Lab 07/23/23 0800 07/25/23 0550 07/27/23 0437 07/28/23 0536 07/29/23 0621  WBC 19.6* 21.0* 12.6* 9.8 7.9  NEUTROABS  --   --  9.5*  --  4.5  HGB 11.9* 11.2* 11.8* 11.8* 11.1*  HCT 35.6* 33.0* 34.1* 34.6* 32.3*  MCV 102.6* 101.2* 99.7 100.3* 100.6*  PLT 113* 128* 161 195 172   Basic Metabolic Panel: Recent Labs  Lab 07/23/23 0800 07/25/23 0550 07/26/23 0558 07/27/23 0437 07/28/23 0536 07/29/23 0621  NA 138 132* 132* 129* 127* 130*  K 4.6 5.3* 5.1 4.3 4.2 3.8  CL 105 100 98 98 93* 95*  CO2 29 26 28 25 28 28   GLUCOSE 129* 107* 81 101* 102* 99  BUN 25* 27* 26* 25* 23* 20  CREATININE 0.70 0.80 0.90 0.89 0.76 0.66  CALCIUM 8.6* 8.5* 8.7* 8.1* 8.0* 8.2*  MG 2.0  --   --   --   --   --   PHOS 3.1  --   --   --   --   --    GFR: Estimated Creatinine Clearance: 185.8 mL/min (by C-G formula based on SCr of 0.66 mg/dL). Liver Function Tests: Recent Labs  Lab 07/25/23 0550 07/26/23 0558  AST 45* 73*  ALT 26 33  ALKPHOS 73 75  BILITOT 1.6* 2.1*  PROT 5.5* 6.0*  ALBUMIN 1.6* 1.7*   No results for input(s): "LIPASE", "AMYLASE" in the last 168 hours. No results for input(s): "AMMONIA" in the last 168 hours. Coagulation Profile: Recent Labs  Lab 07/25/23 1722  INR 1.4*   Cardiac Enzymes: No results for input(s): "CKTOTAL", "CKMB", "CKMBINDEX", "TROPONINI" in the last 168 hours. BNP (last 3 results) No results for input(s): "PROBNP" in the last 8760 hours. HbA1C: No results for input(s): "HGBA1C" in the last 72 hours. CBG: Recent Labs  Lab 07/28/23 1747 07/28/23 2347 07/29/23 0048 07/29/23 0549 07/29/23 1150  GLUCAP 104* 95 110* 105* 111*   Lipid Profile: No results for input(s): "CHOL", "HDL",  "LDLCALC", "TRIG", "CHOLHDL", "LDLDIRECT" in the last 72 hours.  Thyroid Function Tests: No results for input(s): "TSH", "T4TOTAL", "FREET4", "T3FREE", "THYROIDAB" in the last 72 hours. Anemia Panel: No results for input(s): "VITAMINB12", "FOLATE", "FERRITIN", "TIBC", "IRON", "RETICCTPCT" in the last 72 hours. Sepsis Labs: Recent Labs  Lab 07/27/23 0437  PROCALCITON 0.35    Recent Results (from the past 240 hours)  Culture, blood (  Routine X 2) w Reflex to ID Panel     Status: None   Collection Time: 07/21/23  1:42 PM   Specimen: BLOOD  Result Value Ref Range Status   Specimen Description BLOOD BLOOD LEFT HAND  Final   Special Requests   Final    BOTTLES DRAWN AEROBIC AND ANAEROBIC Blood Culture adequate volume   Culture   Final    NO GROWTH 5 DAYS Performed at [facility], [address], [place], [place] [address]    Report Status 07/26/2023 FINAL  Final  Culture, blood (Routine X 2) w Reflex to ID Panel     Status: None   Collection Time: 07/21/23  1:52 PM   Specimen: BLOOD  Result Value Ref Range Status   Specimen Description BLOOD BLOOD LEFT HAND  Final   Special Requests   Final    BOTTLES DRAWN AEROBIC AND ANAEROBIC Blood Culture adequate volume   Culture   Final    NO GROWTH 5 DAYS Performed at [facility], [address], [place], [place] [address]    Report Status 07/26/2023 FINAL  Final  MRSA Next Gen by PCR, Nasal     Status: None   Collection Time: 07/22/23  9:21 AM   Specimen: Nasal Mucosa; Nasal Swab  Result Value Ref Range Status   MRSA by PCR Next Gen NOT DETECTED NOT DETECTED Final    Comment: (NOTE) The GeneXpert MRSA Assay (FDA approved for NASAL specimens only), is one component of a comprehensive MRSA colonization surveillance program. It is not intended to diagnose MRSA infection nor to guide or monitor treatment for MRSA infections. Test performance is not FDA approved in patients less than 39 years old. Performed at  [facility], [address], [place], [place] [address]          Radiology Studies: No results found.      Scheduled Meds:  apixaban  10 mg Oral BID   Followed by   Melene Muller ON 08/03/2023] apixaban  5 mg Oral BID   chlorpheniramine-HYDROcodone  10 mL Oral TID   feeding supplement  237 mL Oral TID BM   folic acid  1 mg Oral Daily   furosemide  40 mg Intravenous BID   insulin aspart  0-9 Units Subcutaneous Q6H   ipratropium-albuterol  3 mL Nebulization TID   multivitamin with minerals  1 tablet Oral Daily   nystatin   Topical TID   pantoprazole (PROTONIX) IV  40 mg Intravenous Q24H   thiamine  100 mg Oral Daily   Or   thiamine  100 mg Intravenous Daily   Continuous Infusions:  albumin human 12.5 g (07/29/23 1046)     LOS: 16 days   Tresa Moore, MD [facility]   If 7PM-7AM, please contact night-coverage  07/29/2023, 12:19 PM

## 2023-07-29 NOTE — Progress Notes (Signed)
 OT Cancellation Note  Patient Details Name: [name] MRN: [id number] DOB: 05/31/1965   Cancelled Treatment:    Reason Eval/Treat Not Completed: Patient declined, no reason specified. Pt declined OT intervention this session and requesting therapist to return in the morning if able.   Jackquline Denmark, MS, OTR/L , CBIS ascom [phone]  07/29/23, 3:21 PM

## 2023-07-30 DIAGNOSIS — A419 Sepsis, unspecified organism: Secondary | ICD-10-CM | POA: Diagnosis not present

## 2023-07-30 DIAGNOSIS — R6521 Severe sepsis with septic shock: Secondary | ICD-10-CM | POA: Diagnosis not present

## 2023-07-30 LAB — BASIC METABOLIC PANEL WITH GFR
Anion gap: 9 (ref 5–15)
BUN: 19 mg/dL (ref 6–20)
CO2: 32 mmol/L (ref 22–32)
Calcium: 8.5 mg/dL — ABNORMAL LOW (ref 8.9–10.3)
Chloride: 91 mmol/L — ABNORMAL LOW (ref 98–111)
Creatinine, Ser: 0.8 mg/dL (ref 0.61–1.24)
GFR, Estimated: >60 mL/min (ref >60)
Glucose, Bld: 101 mg/dL — ABNORMAL HIGH (ref 70–99)
Potassium: 3.4 mmol/L — ABNORMAL LOW (ref 3.5–5.1)
Sodium: 132 mmol/L — ABNORMAL LOW (ref 135–145)

## 2023-07-30 LAB — GLUCOSE, CAPILLARY: Glucose-Capillary: 85 mg/dL (ref 70–99)

## 2023-07-30 LAB — MAGNESIUM: Magnesium: 1.8 mg/dL (ref 1.7–2.4)

## 2023-07-30 MED ORDER — METOLAZONE 2.5 MG PO TABS
2.5000 mg | ORAL_TABLET | Freq: Once | ORAL | Status: AC
  Administered 2023-07-30: 2.5 mg via ORAL
  Filled 2023-07-30: qty 1

## 2023-07-30 MED ORDER — MAGNESIUM SULFATE 2 GM/50ML IV SOLN
2.0000 g | Freq: Once | INTRAVENOUS | Status: AC
  Administered 2023-07-30: 2 g via INTRAVENOUS
  Filled 2023-07-30: qty 50

## 2023-07-30 MED ORDER — POTASSIUM CHLORIDE CRYS ER 20 MEQ PO TBCR
40.0000 meq | EXTENDED_RELEASE_TABLET | Freq: Once | ORAL | Status: AC
  Administered 2023-07-30: 40 meq via ORAL
  Filled 2023-07-30: qty 2

## 2023-07-30 NOTE — Progress Notes (Signed)
 Occupational Therapy Treatment Patient Details Name: [name] MRN: [id number] DOB: May 29, 1965 Today's Date: 07/30/2023   History of present illness Patient is a 58 year old male here for elective  inguinal hernia repair. Had bleeding  required electrocautery and ligature for hemostasis. After getting blood products, patient deteriorated, treated for shock and hypoxemia. Had rapid response with small bowel evisceration secondary to fascial dehiscence requiring surgery for evisercation and retention suture placement. Requiring pressor support.   OT comments  Upon entering the room, pt seated in recliner chair and agreeable to OT intervention. Pt seen for skilled co-treatment with PT with +2 utilized for safety and management of lines and leads. Pt able to stand from chair with supervision and ambulates 30' RW before returning to recliner chair. This is great progress for pt as he has at most ambulated 6' prior to this date. Pt fatigues quickly and OT would like to focus on functional tasks in standing next session. Pt remains far from baseline. Call bell and all needed items within reach.       If plan is discharge home, recommend the following:  A lot of help with bathing/dressing/bathroom;Help with stairs or ramp for entrance;Assist for transportation;Assistance with cooking/housework;A little help with walking and/or transfers   Equipment Recommendations  BSC/3in1       Precautions / Restrictions Precautions Precautions: Fall Recall of Precautions/Restrictions: Intact Precaution/Restrictions Comments: abdominal binder       Mobility Bed Mobility               General bed mobility comments: in recliner on arrival and at end of session    Transfers Overall transfer level: Needs assistance Equipment used: Rolling walker (2 wheels) Transfers: Sit to/from Stand Sit to Stand: Supervision           General transfer comment: sit to stand x 5 from recliner with BUE  support, cues for hand placement on descent     Balance Overall balance assessment: Needs assistance Sitting-balance support: Feet supported Sitting balance-Leahy Scale: Good Sitting balance - Comments: able to perform figure four to don L sock at EOB   Standing balance support: Bilateral upper extremity supported, Reliant on assistive device for balance Standing balance-Leahy Scale: Fair                             ADL either performed or assessed with clinical judgement    Extremity/Trunk Assessment Upper Extremity Assessment Upper Extremity Assessment: Generalized weakness   Lower Extremity Assessment Lower Extremity Assessment: Generalized weakness        Vision Patient Visual Report: No change from baseline           Communication Communication Communication: No apparent difficulties   Cognition Arousal: Alert Behavior During Therapy: WFL for tasks assessed/performed Cognition: No apparent impairments                               Following commands: Intact Following commands impaired: Follows one step commands with increased time                    Pertinent Vitals/ Pain       Pain Assessment Pain Assessment: Faces Faces Pain Scale: Hurts a little bit Pain Location: abdomen Pain Descriptors / Indicators: Discomfort Pain Intervention(s): Limited activity within patient's tolerance, Monitored during session, Repositioned         Frequency  Min 3X/week        Progress Toward Goals  OT Goals(current goals can now be found in the care plan section)  Progress towards OT goals: Progressing toward goals      AM-PAC OT "6 Clicks" Daily Activity     Outcome Measure   Help from another person eating meals?: None Help from another person taking care of personal grooming?: A Little Help from another person toileting, which includes using toliet, bedpan, or urinal?: A Lot Help from another person bathing (including washing,  rinsing, drying)?: A Lot Help from another person to put on and taking off regular upper body clothing?: A Little Help from another person to put on and taking off regular lower body clothing?: A Lot 6 Click Score: 16    End of Session Equipment Utilized During Treatment: Oxygen  OT Visit Diagnosis: Other abnormalities of gait and mobility (R26.89);Muscle weakness (generalized) (M62.81);Pain   Activity Tolerance Patient tolerated treatment well   Patient Left with call bell/phone within reach;in chair;with chair alarm set;with nursing/sitter in room   Nurse Communication Mobility status        Time: 9147-8295 OT Time Calculation (min): 23 min  Charges: OT General Charges $OT Visit: 1 Visit OT Treatments $Therapeutic Activity: 8-22 mins  Jackquline Denmark, MS, OTR/L , CBIS ascom [phone]  07/30/23, 2:45 PM

## 2023-07-30 NOTE — Progress Notes (Signed)
 Physical Therapy Treatment Patient Details Name: [name] MRN: [id number] DOB: 29-Mar-1966 Today's Date: 07/30/2023   History of Present Illness Patient is a 58 year old male here for elective  inguinal hernia repair. Had bleeding  required electrocautery and ligature for hemostasis. After getting blood products, patient deteriorated, treated for shock and hypoxemia. Had rapid response with small bowel evisceration secondary to fascial dehiscence requiring surgery for evisercation and retention suture placement. Requiring pressor support.    PT Comments  Patient seated in recliner on arrival and agreeable to PT session to increase activity completion for progression towards goals. Patient ambulated ~30' with RW and CGA +2 for line management. Able to complete sit to stand x 5 from recliner with B UE support, cues for hand placement on descent. Tolerated well on 3L with slight decrease in spO2 to 87% after ambulation. Discharge updated to reflect current functional level and motivation to return to independence.     If plan is discharge home, recommend the following: Assistance with cooking/housework;Assist for transportation;Help with stairs or ramp for entrance;A little help with walking and/or transfers;A little help with bathing/dressing/bathroom   Can travel by private vehicle     Yes  Equipment Recommendations  Hospital bed;BSC/3in1;Rolling Younis Mathey (2 wheels)    Recommendations for Other Services Rehab consult     Precautions / Restrictions Precautions Precautions: Fall Recall of Precautions/Restrictions: Intact Precaution/Restrictions Comments: abdominal binder Restrictions Weight Bearing Restrictions Per Provider Order: No     Mobility  Bed Mobility               General bed mobility comments: in recliner on arrival and at end of session    Transfers Overall transfer level: Needs assistance Equipment used: Rolling Jaterrius Ricketson (2 wheels) Transfers: Sit to/from  Stand Sit to Stand: Supervision           General transfer comment: sit to stand x 5 from recliner with BUE support, cues for hand placement on descent    Ambulation/Gait Ambulation/Gait assistance: Contact guard assist, +2 safety/equipment Gait Distance (Feet): 30 Feet Assistive device: Rolling Jaquilla Woodroof (2 wheels) Gait Pattern/deviations: Step-through pattern, Decreased stride length Gait velocity: decreased     General Gait Details: CGA for safety. Very slow gait speed.   Stairs             Wheelchair Mobility     Tilt Bed    Modified Rankin (Stroke Patients Only)       Balance Overall balance assessment: Needs assistance Sitting-balance support: Feet supported Sitting balance-Leahy Scale: Good     Standing balance support: Bilateral upper extremity supported, Reliant on assistive device for balance Standing balance-Leahy Scale: Fair                              Hotel manager: No apparent difficulties  Cognition Arousal: Alert Behavior During Therapy: WFL for tasks assessed/performed   PT - Cognitive impairments: No apparent impairments                         Following commands: Intact Following commands impaired: Follows one step commands with increased time    Cueing    Exercises      General Comments        Pertinent Vitals/Pain Pain Assessment Pain Assessment: Faces Faces Pain Scale: Hurts a little bit Pain Location: abdomen Pain Descriptors / Indicators: Discomfort Pain Intervention(s): Limited activity within patient's tolerance, Monitored during  session    Home Living                          Prior Function            PT Goals (current goals can now be found in the care plan section) Acute Rehab PT Goals Patient Stated Goal: to go home PT Goal Formulation: With patient/family Time For Goal Achievement: 08/03/23 Potential to Achieve Goals: Fair Progress towards  PT goals: Progressing toward goals    Frequency    Min 3X/week      PT Plan      Co-evaluation              AM-PAC PT "6 Clicks" Mobility   Outcome Measure  Help needed turning from your back to your side while in a flat bed without using bedrails?: A Little Help needed moving from lying on your back to sitting on the side of a flat bed without using bedrails?: A Lot Help needed moving to and from a bed to a chair (including a wheelchair)?: A Little Help needed standing up from a chair using your arms (e.g., wheelchair or bedside chair)?: A Little Help needed to walk in hospital room?: A Little Help needed climbing 3-5 steps with a railing? : A Lot 6 Click Score: 16    End of Session Equipment Utilized During Treatment: Oxygen;Other (comment) (abdominal binder) Activity Tolerance: Patient limited by fatigue Patient left: in chair;with call bell/phone within reach;with family/visitor present Nurse Communication: Mobility status PT Visit Diagnosis: Unsteadiness on feet (R26.81);Difficulty in walking, not elsewhere classified (R26.2);Muscle weakness (generalized) (M62.81) Pain - part of body:  (abdomen)     Time: 4098-1191 PT Time Calculation (min) (ACUTE ONLY): 23 min  Charges:    $Therapeutic Activity: 8-22 mins PT General Charges $$ ACUTE PT VISIT: 1 Visit                     Maylon Peppers, PT, DPT Physical Therapist - [facility]    Juniel Groene A Luverna Degenhart 07/30/2023, 1:16 PM

## 2023-07-30 NOTE — TOC Progression Note (Signed)
 Transition of Care [facility]) - Progression Note    Patient Details  Name: [name] MRN: [id number] Date of Birth: 08-01-1965  [facility] CM/SW Contact  Liliana Cline, LCSW Phone Number: 07/30/2023, 10:10 AM  Clinical Narrative:    CSW attempted call to Colin Mulders at [facility] to inquire if she received the referral - left a VM.       Expected Discharge Plan and Services                                               Social Determinants of Health (SDOH) Interventions SDOH Screenings   Food Insecurity: No Food Insecurity (07/14/2023)  Housing: Low Risk  (07/14/2023)  Transportation Needs: No Transportation Needs (07/14/2023)  Utilities: Not At Risk (07/14/2023)  Tobacco Use: High Risk (07/13/2023)    Readmission Risk Interventions     No data to display

## 2023-07-30 NOTE — Progress Notes (Signed)
 PROGRESS NOTE    [name]  ZOX:[id number] DOB: Dec 07, 1965 DOA: 07/13/2023 PCP: Ailene Ravel, MD    Brief Narrative:  58 yo M with hx of obesity , OSA, , EtOH abuse hx of admission to hospital for ETOH withdrawal. He came in for elective surgery to have inguinal hernia repair with lysis of adhesions. He had significant bleeding per surgeon which was unusual and required electrocautery and ligature for hemostasis. He was brought back with abdominal JP drain and had FFP and platelets ordered due to intraoperative bleeding. After receiving these blood products patient deteriorated and developed circulatory shock, hypoxemia, swelling worse on lower extremities. He had code RRT called and was emergently brought to ICU due to shock with hypoxemia. He was treated for anaphylactic non febrile blood product reaction vs TRALI and improved overnight. He became stable and was downgraded to medical floor.    3/18 patient had bilateral inguinal hernia repair plus lysis of adhesions and exploratory laparotomy. 3/22.  Patient was brought to the operating room by Dr. Aleen Campi for fascial dehiscence with small bowel evisceration and handout exploratory laparotomy with abdominal washout and abdominal closure with retention sutures.   07/17/23- patient had code RRT overnight, he had findings of small bowel evisceration secondary to fascial dehiscence.  This likely stemmed from forceful cough per surgery service.  He became unstable in distress and required endotracheal intubation.  He was taken to OR emergently for ex-lap and closure of abdominal cavity.  Daughter was available to come in and we reviewed medical plan. CVl placed   07/18/23- extubated but reports severe pain. Daughter at bedside and patient is improved able to speak in full sentences.  Reviewed case with surgery and placed abdominal binder.  He is on CIWA due to reported daily drinking.    3/24 lethargic but arousable, remains  on pressors    3/25 remains on pressors, foley in place.  Central line removed and PICC line placed   3/26.  Medical team took over.  Foley catheter removed.  Patient spiked a fever of 101.  Blood cultures, chest x-ray, urine analysis and urine culture (after Foley removed).  Patient already on fluconazole and meropenem. 3/27.  Since MRSA PCR came back negative vancomycin was discontinued.  NG tube came out and needed to be replaced. 3/28.  Patient started on clear liquid diet.  Still has NG tube. 3/29.  Will continue IV Lasix on a daily basis with weight gain up to 395 pounds.  Patient states that he normally is around 350. 3/30.  Right arm swelling.  Ultrasound showed no DVT.  PICC line removed.  TPN stopped.  Started Lovenox injections. 3/31.  CT scan of the abdomen pelvis shows postoperative ileus.  CT scan of the chest shows pneumonia.  Still on meropenem.  Nystatin powder under skin folds.  Placed on solid food by general surgery 4/1.  Patient did have a poor night of sleep and is sleepy today.  White blood cell count down to 12.6 and procalcitonin down to 0.35.  Will discontinue meropenem today and monitor.  Change Lovenox injections over to Eliquis for this evening. 4/4: Patient overall diuresing well.  Sodium improving.  Assessment & Plan:   Principal Problem:   Septic shock (HCC) Active Problems:   Fever   Perioperative dehiscence of abdominal wound with evisceration   Arm DVT (deep venous thromboembolism), acute, right (HCC)   Ventral hernia without obstruction or gangrene   Inguinal hernia   Anaphylactic reaction  TRALI (transfusion related acute lung injury)   Thrombocytopenia (HCC)   Acute respiratory failure with hypoxia and hypercapnia (HCC)   History of alcohol abuse   AKI (acute kidney injury) (HCC)   Morbid obesity with BMI of 50.0-59.9, adult (HCC)   Weight gain   Hyperkalemia   Hyponatremia   Swelling of arm   Skin rash  Anasarca/fluid overload Hyponatremia, suspect  hypovolemic, improving Patient with significant pitting edema in bilateral lower extremities and right upper extremity.  Likely multifactorial in nature.  Has been receiving IV Lasix.  Serum sodium started to increase again after addition of metolazone and albumin Plan: Continue Lasix 40 mg IV twice daily Metolazone 2.5 mg x 1.  Reassess daily for need Continue albumin 25% 12.5 g every 6 hours Repeat metabolites in a.m.  * Septic shock (HCC), resolved Secondary to abdominal wound dehiscence.   Patient completed meropenem on 4/1 and completed Diflucan on 3/29.   No shock physiology noted   Fever On 3/26.  No recurrence.  Chest x-ray showing multifocal pneumonia.  Already on meropenem.  Central line removed 3/25 and PICC line placed 3/25.  Foley catheter removed 3/25.  Blood cultures are negative for 5 days.  White blood cell count trended down 12.6.  Procalcitonin 0.35.  Meropenem stopped.  Continue monitoring vitals and fever curve   Perioperative dehiscence of abdominal wound with evisceration Completed antibiotics.  Discontinued TPN after PICC line removal.  Patient had NG tube removed on 3/29.  On clear liquid diet on 3/30.  General surgery advance diet to solid food on 3/31.  Seems to be tolerating.  General surgery will follow-up as outpatient.   Arm DVT (deep venous thromboembolism), acute, right (HCC) PICC line removed on 3/30.  Initially started on Lovenox.  Now on p.o. Eliquis and tolerating.   Skin rash Patient has some sloughing of the skin underneath skin folds and in the armpit.  Will use nystatin powder.     Hyperkalemia Improved with 1 dose of Lokelma   Weight gain Patient states his normal rate is around 350 pounds.  Patient was as high as 395.95 pounds.  Started diuresis.  Continue to monitor weights.   Morbid obesity with BMI of 50.0-59.9, adult [facility]) Patient has had quite a bit of weight gain during the hospital course received quite a bit of fluids TPN and  antibiotics.     AKI (acute kidney injury) (HCC) Resolved.  Creatinine 1.29 on 3/19.  Creatinine went as low to 0.7.     History of alcohol abuse On thiamine and folic acid   Acute respiratory failure with hypoxia and hypercapnia (HCC) Patient on 4 L of oxygen.  Required intubation and extubation during the hospital course.  Continue Lasix IV twice daily.  Every 6 hours albumin.  Wean oxygen as tolerated.   Thrombocytopenia (HCC) Platelet count recovered to reference range   TRALI (transfusion related acute lung injury) During the hospital course.   Inguinal hernia Initial surgery.   DVT prophylaxis: Eliquis Code Status: Full Family Communication: Daughter at bedside 4/2, 4/3 Disposition Plan: Status is: Inpatient Remains inpatient appropriate because: Anasarca/fluid overload on IV diuresis   Level of care: Med-Surg  Consultants:  None  Procedures:  Exploratory laparotomy  Antimicrobials: None   Subjective: Seen and examined.  Clinically appears improved.  Sitting up in chair.  No visible distress  Objective: Vitals:   07/30/23 0419 07/30/23 0500 07/30/23 0744 07/30/23 0849  BP: 122/70   133/73  Pulse: 91  92  Resp: 20   16  Temp: 98 F (36.7 C)   98.3 F (36.8 C)  TempSrc: Oral     SpO2: 95%  97% 92%  Weight:  (!) 163.4 kg    Height:        Intake/Output Summary (Last 24 hours) at 07/30/2023 1054 Last data filed at 07/30/2023 1478 Gross per 24 hour  Intake 211.06 ml  Output 5300 ml  Net -5088.94 ml   Filed Weights   07/28/23 0428 07/29/23 0402 07/30/23 0500  Weight: (!) 191.6 kg (!) 191.5 kg (!) 163.4 kg    Examination:  General exam: NAD Respiratory system: Overall clear with bibasilar crackles.  Normal work of breathing.  3 L Cardiovascular system: S1-S2, RRR, no murmurs, 3+ pitting edema BLE Gastrointestinal system: Obese, soft, NT/ND, decreased bowel sounds Central nervous system: Alert and oriented. No focal neurological  deficits. Extremities: Bilateral lower extremities edematous.  Right upper extremity edematous. Skin: No rashes, lesions or ulcers Psychiatry: Judgement and insight appear normal. Mood & affect appropriate.     Data Reviewed: I have personally reviewed following labs and imaging studies  CBC: Recent Labs  Lab 07/25/23 0550 07/27/23 0437 07/28/23 0536 07/29/23 0621  WBC 21.0* 12.6* 9.8 7.9  NEUTROABS  --  9.5*  --  4.5  HGB 11.2* 11.8* 11.8* 11.1*  HCT 33.0* 34.1* 34.6* 32.3*  MCV 101.2* 99.7 100.3* 100.6*  PLT 128* 161 195 172   Basic Metabolic Panel: Recent Labs  Lab 07/26/23 0558 07/27/23 0437 07/28/23 0536 07/29/23 0621 07/30/23 0541  NA 132* 129* 127* 130* 132*  K 5.1 4.3 4.2 3.8 3.4*  CL 98 98 93* 95* 91*  CO2 28 25 28 28  32  GLUCOSE 81 101* 102* 99 101*  BUN 26* 25* 23* 20 19  CREATININE 0.90 0.89 0.76 0.66 0.80  CALCIUM 8.7* 8.1* 8.0* 8.2* 8.5*  MG  --   --   --   --  1.8   GFR: Estimated Creatinine Clearance: 169.3 mL/min (by C-G formula based on SCr of 0.8 mg/dL). Liver Function Tests: Recent Labs  Lab 07/25/23 0550 07/26/23 0558  AST 45* 73*  ALT 26 33  ALKPHOS 73 75  BILITOT 1.6* 2.1*  PROT 5.5* 6.0*  ALBUMIN 1.6* 1.7*   No results for input(s): "LIPASE", "AMYLASE" in the last 168 hours. No results for input(s): "AMMONIA" in the last 168 hours. Coagulation Profile: Recent Labs  Lab 07/25/23 1722  INR 1.4*   Cardiac Enzymes: No results for input(s): "CKTOTAL", "CKMB", "CKMBINDEX", "TROPONINI" in the last 168 hours. BNP (last 3 results) No results for input(s): "PROBNP" in the last 8760 hours. HbA1C: No results for input(s): "HGBA1C" in the last 72 hours. CBG: Recent Labs  Lab 07/29/23 0549 07/29/23 1150 07/29/23 1757 07/29/23 2314 07/30/23 0521  GLUCAP 105* 111* 111* 91 85   Lipid Profile: No results for input(s): "CHOL", "HDL", "LDLCALC", "TRIG", "CHOLHDL", "LDLDIRECT" in the last 72 hours.  Thyroid Function Tests: No  results for input(s): "TSH", "T4TOTAL", "FREET4", "T3FREE", "THYROIDAB" in the last 72 hours. Anemia Panel: No results for input(s): "VITAMINB12", "FOLATE", "FERRITIN", "TIBC", "IRON", "RETICCTPCT" in the last 72 hours. Sepsis Labs: Recent Labs  Lab 07/27/23 0437  PROCALCITON 0.35    Recent Results (from the past 240 hours)  Culture, blood (Routine X 2) w Reflex to ID Panel     Status: None   Collection Time: 07/21/23  1:42 PM   Specimen: BLOOD  Result Value Ref Range Status  Specimen Description BLOOD BLOOD LEFT HAND  Final   Special Requests   Final    BOTTLES DRAWN AEROBIC AND ANAEROBIC Blood Culture adequate volume   Culture   Final    NO GROWTH 5 DAYS Performed at [facility], [address] [place], [place] [address]    Report Status 07/26/2023 FINAL  Final  Culture, blood (Routine X 2) w Reflex to ID Panel     Status: None   Collection Time: 07/21/23  1:52 PM   Specimen: BLOOD  Result Value Ref Range Status   Specimen Description BLOOD BLOOD LEFT HAND  Final   Special Requests   Final    BOTTLES DRAWN AEROBIC AND ANAEROBIC Blood Culture adequate volume   Culture   Final    NO GROWTH 5 DAYS Performed at [facility], [address], [place], [place] [address]    Report Status 07/26/2023 FINAL  Final  MRSA Next Gen by PCR, Nasal     Status: None   Collection Time: 07/22/23  9:21 AM   Specimen: Nasal Mucosa; Nasal Swab  Result Value Ref Range Status   MRSA by PCR Next Gen NOT DETECTED NOT DETECTED Final    Comment: (NOTE) The GeneXpert MRSA Assay (FDA approved for NASAL specimens only), is one component of a comprehensive MRSA colonization surveillance program. It is not intended to diagnose MRSA infection nor to guide or monitor treatment for MRSA infections. Test performance is not FDA approved in patients less than 51 years old. Performed at [facility], [address] [place], [place] [address]          Radiology  Studies: No results found.      Scheduled Meds:  apixaban  10 mg Oral BID   Followed by   Melene Muller ON 08/03/2023] apixaban  5 mg Oral BID   chlorpheniramine-HYDROcodone  10 mL Oral TID   feeding supplement  237 mL Oral TID BM   folic acid  1 mg Oral Daily   furosemide  40 mg Intravenous BID   ipratropium-albuterol  3 mL Nebulization TID   multivitamin with minerals  1 tablet Oral Daily   nystatin   Topical TID   pantoprazole  40 mg Oral QHS   thiamine  100 mg Oral Daily   Or   thiamine  100 mg Intravenous Daily   Continuous Infusions:  albumin human 60 mL/hr at 07/30/23 0923   magnesium sulfate bolus IVPB 2 g (07/30/23 1010)     LOS: 17 days   Tresa Moore, MD [facility]   If 7PM-7AM, please contact night-coverage  07/30/2023, 10:54 AM

## 2023-07-30 NOTE — Progress Notes (Signed)
   Inpatient Rehab Admissions Coordinator :  Per therapy change in recommendation,  patient was screened for CIR candidacy by Ottie Glazier RN MSN. Patient is not in need of AIR level rehab at this current functional level. Recommend other rehab venues to continue to be pursued. Please contact me with any questions.  Ottie Glazier RN MSN Admissions Coordinator [phone]

## 2023-07-31 DIAGNOSIS — A419 Sepsis, unspecified organism: Secondary | ICD-10-CM | POA: Diagnosis not present

## 2023-07-31 DIAGNOSIS — R6521 Severe sepsis with septic shock: Secondary | ICD-10-CM | POA: Diagnosis not present

## 2023-07-31 LAB — BASIC METABOLIC PANEL WITH GFR
Anion gap: 8 (ref 5–15)
BUN: 18 mg/dL (ref 6–20)
CO2: 34 mmol/L — ABNORMAL HIGH (ref 22–32)
Calcium: 8.6 mg/dL — ABNORMAL LOW (ref 8.9–10.3)
Chloride: 90 mmol/L — ABNORMAL LOW (ref 98–111)
Creatinine, Ser: 0.76 mg/dL (ref 0.61–1.24)
GFR, Estimated: >60 mL/min (ref >60)
Glucose, Bld: 115 mg/dL — ABNORMAL HIGH (ref 70–99)
Potassium: 3.3 mmol/L — ABNORMAL LOW (ref 3.5–5.1)
Sodium: 132 mmol/L — ABNORMAL LOW (ref 135–145)

## 2023-07-31 LAB — MAGNESIUM: Magnesium: 1.9 mg/dL (ref 1.7–2.4)

## 2023-07-31 MED ORDER — METOLAZONE 2.5 MG PO TABS
2.5000 mg | ORAL_TABLET | Freq: Once | ORAL | Status: AC
  Administered 2023-07-31: 2.5 mg via ORAL
  Filled 2023-07-31: qty 1

## 2023-07-31 MED ORDER — MAGNESIUM SULFATE 2 GM/50ML IV SOLN
2.0000 g | Freq: Once | INTRAVENOUS | Status: AC
  Administered 2023-07-31: 2 g via INTRAVENOUS
  Filled 2023-07-31: qty 50

## 2023-07-31 MED ORDER — POTASSIUM CHLORIDE CRYS ER 20 MEQ PO TBCR
40.0000 meq | EXTENDED_RELEASE_TABLET | Freq: Once | ORAL | Status: AC
  Administered 2023-07-31: 40 meq via ORAL
  Filled 2023-07-31: qty 2

## 2023-07-31 NOTE — Plan of Care (Signed)

## 2023-07-31 NOTE — Progress Notes (Signed)
 PROGRESS NOTE    [name]  HYQ:[id number] DOB: [birth year] DOA: 07/13/2023 PCP: Ailene Ravel, MD    Brief Narrative:  58 yo M with hx of obesity , OSA, , EtOH abuse hx of admission to hospital for ETOH withdrawal. He came in for elective surgery to have inguinal hernia repair with lysis of adhesions. He had significant bleeding per surgeon which was unusual and required electrocautery and ligature for hemostasis. He was brought back with abdominal JP drain and had FFP and platelets ordered due to intraoperative bleeding. After receiving these blood products patient deteriorated and developed circulatory shock, hypoxemia, swelling worse on lower extremities. He had code RRT called and was emergently brought to ICU due to shock with hypoxemia. He was treated for anaphylactic non febrile blood product reaction vs TRALI and improved overnight. He became stable and was downgraded to medical floor.    3/18 patient had bilateral inguinal hernia repair plus lysis of adhesions and exploratory laparotomy. 3/22.  Patient was brought to the operating room by Dr. Aleen Campi for fascial dehiscence with small bowel evisceration and handout exploratory laparotomy with abdominal washout and abdominal closure with retention sutures.   07/17/23- patient had code RRT overnight, he had findings of small bowel evisceration secondary to fascial dehiscence.  This likely stemmed from forceful cough per surgery service.  He became unstable in distress and required endotracheal intubation.  He was taken to OR emergently for ex-lap and closure of abdominal cavity.  Daughter was available to come in and we reviewed medical plan. CVl placed   07/18/23- extubated but reports severe pain. Daughter at bedside and patient is improved able to speak in full sentences.  Reviewed case with surgery and placed abdominal binder.  He is on CIWA due to reported daily drinking.    3/24 lethargic but arousable, remains  on pressors    3/25 remains on pressors, foley in place.  Central line removed and PICC line placed   3/26.  Medical team took over.  Foley catheter removed.  Patient spiked a fever of 101.  Blood cultures, chest x-ray, urine analysis and urine culture (after Foley removed).  Patient already on fluconazole and meropenem. 3/27.  Since MRSA PCR came back negative vancomycin was discontinued.  NG tube came out and needed to be replaced. 3/28.  Patient started on clear liquid diet.  Still has NG tube. 3/29.  Will continue IV Lasix on a daily basis with weight gain up to 395 pounds.  Patient states that he normally is around 350. 3/30.  Right arm swelling.  Ultrasound showed no DVT.  PICC line removed.  TPN stopped.  Started Lovenox injections. 3/31.  CT scan of the abdomen pelvis shows postoperative ileus.  CT scan of the chest shows pneumonia.  Still on meropenem.  Nystatin powder under skin folds.  Placed on solid food by general surgery 4/1.  Patient did have a poor night of sleep and is sleepy today.  White blood cell count down to 12.6 and procalcitonin down to 0.35.  Will discontinue meropenem today and monitor.  Change Lovenox injections over to Eliquis for this evening. 4/4: Patient overall diuresing well.  Sodium improving.  Assessment & Plan:   Principal Problem:   Septic shock (HCC) Active Problems:   Fever   Perioperative dehiscence of abdominal wound with evisceration   Arm DVT (deep venous thromboembolism), acute, right (HCC)   Ventral hernia without obstruction or gangrene   Inguinal hernia   Anaphylactic reaction  TRALI (transfusion related acute lung injury)   Thrombocytopenia (HCC)   Acute respiratory failure with hypoxia and hypercapnia (HCC)   History of alcohol abuse   AKI (acute kidney injury) (HCC)   Morbid obesity with BMI of 50.0-59.9, adult (HCC)   Weight gain   Hyperkalemia   Hyponatremia   Swelling of arm   Skin rash  Anasarca/fluid overload Hyponatremia, suspect  hypovolemic, improving Patient with significant pitting edema in bilateral lower extremities and right upper extremity.  Likely multifactorial in nature.  Has been receiving IV Lasix.  Serum sodium started to increase again after addition of metolazone and albumin 4/5: Bicarb elevated indicating evolving contraction alkalosis Plan: Continue Lasix 40 mg IV twice daily Metolazone 2.5 mg x 1.  Reassess daily for need Continue albumin 25% 12.5 g every 6 hours for additional 3-4 doses Repeat metabolites in a.m. If kidney function stable and contraction alkalosis worsening can consider addition of Diamox  * Septic shock (HCC), resolved Secondary to abdominal wound dehiscence.   Patient completed meropenem on 4/1 and completed Diflucan on 3/29.   No shock physiology noted   Fever On 3/26.  No recurrence.  Chest x-ray showing multifocal pneumonia.  Already on meropenem.  Central line removed 3/25 and PICC line placed 3/25.  Foley catheter removed 3/25.  Blood cultures are negative for 5 days.  White blood cell count trended down 12.6.  Procalcitonin 0.35.  Meropenem stopped.  Continue monitoring vitals and fever curve   Perioperative dehiscence of abdominal wound with evisceration Completed antibiotics.  Discontinued TPN after PICC line removal.  Patient had NG tube removed on 3/29.  On clear liquid diet on 3/30.  General surgery advance diet to solid food on 3/31.  Seems to be tolerating.  General surgery will follow-up as outpatient.   Arm DVT (deep venous thromboembolism), acute, right (HCC) PICC line removed on 3/30.   Initially started on Lovenox.   Now on p.o. Eliquis and tolerating.   Skin rash Patient has some sloughing of the skin underneath skin folds and in the armpit.   As needed nystatin powder     Hyperkalemia Improved with 1 dose of Lokelma   Weight gain Patient states his normal rate is around 350 pounds.   Patient was as high as 395.95 pounds.  Started diuresis.  Continue  to monitor weights. Bed weights and accurate   Morbid obesity with BMI of 50.0-59.9, adult [facility]) Patient has had quite a bit of weight gain during the hospital course received quite a bit of fluids TPN and antibiotics.     AKI (acute kidney injury) (HCC) Resolved.  Creatinine 1.29 on 3/19.  Creatinine went as low to 0.7.     History of alcohol abuse On thiamine and folic acid   Acute respiratory failure with hypoxia and hypercapnia (HCC) Patient on 4 L of oxygen.  Required intubation and extubation during the hospital course.  Continue Lasix IV twice daily.  Every 6 hours albumin.  Wean oxygen as tolerated.   Thrombocytopenia (HCC) Platelet count recovered to reference range   TRALI (transfusion related acute lung injury) During the hospital course.   Inguinal hernia Initial surgery.   DVT prophylaxis: Eliquis Code Status: Full Family Communication: Daughter at bedside 4/2, 4/3, 4/5 Disposition Plan: Status is: Inpatient Remains inpatient appropriate because: Anasarca/fluid overload on IV diuresis   Level of care: Med-Surg  Consultants:  None  Procedures:  Exploratory laparotomy  Antimicrobials: None   Subjective: Seen examined.  A little more sleepy  this morning.  Still mentating clearly.  Objective: Vitals:   07/31/23 0326 07/31/23 0408 07/31/23 0716 07/31/23 0815  BP:  121/60  130/60  Pulse:  95  94  Resp:  20  18  Temp:  98.2 F (36.8 C)  98 F (36.7 C)  TempSrc:    Oral  SpO2:  (!) 88% 93% 92%  Weight: (!) 163.8 kg     Height:        Intake/Output Summary (Last 24 hours) at 07/31/2023 1133 Last data filed at 07/31/2023 1045 Gross per 24 hour  Intake 319.9 ml  Output 2900 ml  Net -2580.1 ml   Filed Weights   07/29/23 0402 07/30/23 0500 07/31/23 0326  Weight: (!) 191.5 kg (!) 163.4 kg (!) 163.8 kg    Examination:  General exam: No acute distress Respiratory system: Overall clear with bibasilar crackles.  Normal work of breathing.  3  L Cardiovascular system: S1-S2, RRR, no murmurs, 3+ pitting edema BLE Gastrointestinal system: Obese, soft, NT/ND, decreased bowel sounds Central nervous system: Alert and oriented. No focal neurological deficits. Extremities: Bilateral lower extremities edematous.  Right upper extremity edematous. Skin: No rashes, lesions or ulcers Psychiatry: Judgement and insight appear normal. Mood & affect appropriate.     Data Reviewed: I have personally reviewed following labs and imaging studies  CBC: Recent Labs  Lab 07/25/23 0550 07/27/23 0437 07/28/23 0536 07/29/23 0621  WBC 21.0* 12.6* 9.8 7.9  NEUTROABS  --  9.5*  --  4.5  HGB 11.2* 11.8* 11.8* 11.1*  HCT 33.0* 34.1* 34.6* 32.3*  MCV 101.2* 99.7 100.3* 100.6*  PLT 128* 161 195 172   Basic Metabolic Panel: Recent Labs  Lab 07/27/23 0437 07/28/23 0536 07/29/23 0621 07/30/23 0541 07/31/23 0431  NA 129* 127* 130* 132* 132*  K 4.3 4.2 3.8 3.4* 3.3*  CL 98 93* 95* 91* 90*  CO2 25 28 28  32 34*  GLUCOSE 101* 102* 99 101* 115*  BUN 25* 23* 20 19 18   CREATININE 0.89 0.76 0.66 0.80 0.76  CALCIUM 8.1* 8.0* 8.2* 8.5* 8.6*  MG  --   --   --  1.8 1.9   GFR: Estimated Creatinine Clearance: 169.4 mL/min (by C-G formula based on SCr of 0.76 mg/dL). Liver Function Tests: Recent Labs  Lab 07/25/23 0550 07/26/23 0558  AST 45* 73*  ALT 26 33  ALKPHOS 73 75  BILITOT 1.6* 2.1*  PROT 5.5* 6.0*  ALBUMIN 1.6* 1.7*   No results for input(s): "LIPASE", "AMYLASE" in the last 168 hours. No results for input(s): "AMMONIA" in the last 168 hours. Coagulation Profile: Recent Labs  Lab 07/25/23 1722  INR 1.4*   Cardiac Enzymes: No results for input(s): "CKTOTAL", "CKMB", "CKMBINDEX", "TROPONINI" in the last 168 hours. BNP (last 3 results) No results for input(s): "PROBNP" in the last 8760 hours. HbA1C: No results for input(s): "HGBA1C" in the last 72 hours. CBG: Recent Labs  Lab 07/29/23 0549 07/29/23 1150 07/29/23 1757  07/29/23 2314 07/30/23 0521  GLUCAP 105* 111* 111* 91 85   Lipid Profile: No results for input(s): "CHOL", "HDL", "LDLCALC", "TRIG", "CHOLHDL", "LDLDIRECT" in the last 72 hours.  Thyroid Function Tests: No results for input(s): "TSH", "T4TOTAL", "FREET4", "T3FREE", "THYROIDAB" in the last 72 hours. Anemia Panel: No results for input(s): "VITAMINB12", "FOLATE", "FERRITIN", "TIBC", "IRON", "RETICCTPCT" in the last 72 hours. Sepsis Labs: Recent Labs  Lab 07/27/23 0437  PROCALCITON 0.35    Recent Results (from the past 240 hours)  Culture, blood (Routine X  2) w Reflex to ID Panel     Status: None   Collection Time: 07/21/23  1:42 PM   Specimen: BLOOD  Result Value Ref Range Status   Specimen Description BLOOD BLOOD LEFT HAND  Final   Special Requests   Final    BOTTLES DRAWN AEROBIC AND ANAEROBIC Blood Culture adequate volume   Culture   Final    NO GROWTH 5 DAYS Performed at [facility], [address] [place], [place] [address]    Report Status 07/26/2023 FINAL  Final  Culture, blood (Routine X 2) w Reflex to ID Panel     Status: None   Collection Time: 07/21/23  1:52 PM   Specimen: BLOOD  Result Value Ref Range Status   Specimen Description BLOOD BLOOD LEFT HAND  Final   Special Requests   Final    BOTTLES DRAWN AEROBIC AND ANAEROBIC Blood Culture adequate volume   Culture   Final    NO GROWTH 5 DAYS Performed at [facility], [address] [place], [place] [address]    Report Status 07/26/2023 FINAL  Final  MRSA Next Gen by PCR, Nasal     Status: None   Collection Time: 07/22/23  9:21 AM   Specimen: Nasal Mucosa; Nasal Swab  Result Value Ref Range Status   MRSA by PCR Next Gen NOT DETECTED NOT DETECTED Final    Comment: (NOTE) The GeneXpert MRSA Assay (FDA approved for NASAL specimens only), is one component of a comprehensive MRSA colonization surveillance program. It is not intended to diagnose MRSA infection nor to guide or monitor  treatment for MRSA infections. Test performance is not FDA approved in patients less than 41 years old. Performed at [facility], [address], [place], [place] [address]          Radiology Studies: No results found.      Scheduled Meds:  apixaban  10 mg Oral BID   Followed by   Melene Muller ON 08/03/2023] apixaban  5 mg Oral BID   chlorpheniramine-HYDROcodone  10 mL Oral TID   feeding supplement  237 mL Oral TID BM   folic acid  1 mg Oral Daily   furosemide  40 mg Intravenous BID   ipratropium-albuterol  3 mL Nebulization TID   multivitamin with minerals  1 tablet Oral Daily   nystatin   Topical TID   pantoprazole  40 mg Oral QHS   thiamine  100 mg Oral Daily   Or   thiamine  100 mg Intravenous Daily   Continuous Infusions:  albumin human 12.5 g (07/31/23 0910)     LOS: 18 days   Tresa Moore, MD [facility]   If 7PM-7AM, please contact night-coverage  07/31/2023, 11:33 AM

## 2023-07-31 NOTE — Plan of Care (Signed)
  Problem: Education: Goal: Knowledge of General Education information will improve Description: Including pain rating scale, medication(s)/side effects and non-pharmacologic comfort measures Outcome: Progressing   Problem: Nutrition: Goal: Adequate nutrition will be maintained Outcome: Progressing   Problem: Elimination: Goal: Will not experience complications related to bowel motility Outcome: Progressing Goal: Will not experience complications related to urinary retention Outcome: Progressing   Problem: Pain Managment: Goal: General experience of comfort will improve and/or be controlled Outcome: Progressing

## 2023-07-31 NOTE — Progress Notes (Signed)
 Physical Therapy Treatment Patient Details Name: [name] MRN: [id number] DOB: [birth year] Today's Date: 07/31/2023   History of Present Illness Patient is a 58 year old male here for elective  inguinal hernia repair. Had bleeding  required electrocautery and ligature for hemostasis. After getting blood products, patient deteriorated, treated for shock and hypoxemia. Had rapid response with small bowel evisceration secondary to fascial dehiscence requiring surgery for evisercation and retention suture placement. Requiring pressor support.    PT Comments  Pt was long sitting in bed upon arrival. He is A and O x 4. At first, did not want to participate however was easily convinced. Once agreeable pt puts forth great effort and does demonstrate improving abilities. Overall activity remains limited by fatigue. Pt was able to tolerate getting OOB, standing to bariatric RW, and ambulating to doorway of room and return. Distance limited by fatigue. Author encouraged pt to increase daily activity moving forward in hopes of advancing to PLOF. Acute PT will continue to follow and progress as able per current POC.    If plan is discharge home, recommend the following: Assistance with cooking/housework;Assist for transportation;Help with stairs or ramp for entrance;A little help with walking and/or transfers;A little help with bathing/dressing/bathroom     Equipment Recommendations  Hospital bed;BSC/3in1;Rolling walker (2 wheels)       Precautions / Restrictions Precautions Precautions: Fall Recall of Precautions/Restrictions: Intact Precaution/Restrictions Comments: abdominal binder Restrictions Weight Bearing Restrictions Per Provider Order: No     Mobility  Bed Mobility Overal bed mobility: Needs Assistance Bed Mobility: Supine to Sit, Sit to Supine  Supine to sit: Min assist, Used rails, HOB elevated Sit to supine: Min assist General bed mobility comments: pt was able to exit bed and then  return to long sitting in bed with min assist only    Transfers Overall transfer level: Needs assistance Equipment used: Rolling walker (2 wheels) Transfers: Sit to/from Stand Sit to Stand: Supervision  General transfer comment: pt stood from EOB to bariatric RW with supervision only. vcs only for improved technique and wt shift    Ambulation/Gait Ambulation/Gait assistance: Supervision Gait Distance (Feet): 40 Feet Assistive device: Rolling walker (2 wheels) Gait Pattern/deviations: Step-through pattern Gait velocity: decreased  General Gait Details: Pt was able to ambulate ~ 40 ft with RW with slow cadence however no LOB or safety concern. distnce limited by pt fatigue. Encouraged pt to increase activity daily to improve active tolerance/endurance   Balance Overall balance assessment: Needs assistance Sitting-balance support: Feet supported Sitting balance-Leahy Scale: Good     Standing balance support: Bilateral upper extremity supported, Reliant on assistive device for balance Standing balance-Leahy Scale: Fair     Hotel manager: No apparent difficulties  Cognition Arousal: Alert Behavior During Therapy: WFL for tasks assessed/performed   PT - Cognitive impairments: No apparent impairments    PT - Cognition Comments: Pt is A and O x 4. pleasant and cooprative throughout Following commands: Intact Following commands impaired: Follows one step commands with increased time    Cueing Cueing Techniques: Verbal cues, Tactile cues         Pertinent Vitals/Pain Pain Assessment Pain Assessment: 0-10 Pain Score: 4  Pain Location: abdomen Pain Descriptors / Indicators: Discomfort Pain Intervention(s): Limited activity within patient's tolerance, Monitored during session, Premedicated before session, Repositioned     PT Goals (current goals can now be found in the care plan section) Acute Rehab PT Goals Patient Stated Goal: get better so I can  return home eventually  Progress towards PT goals: Progressing toward goals    Frequency    Min 3X/week           Co-evaluation     PT goals addressed during session: Mobility/safety with mobility;Balance;Proper use of DME;Strengthening/ROM        AM-PAC PT "6 Clicks" Mobility   Outcome Measure  Help needed turning from your back to your side while in a flat bed without using bedrails?: A Little Help needed moving from lying on your back to sitting on the side of a flat bed without using bedrails?: A Little Help needed moving to and from a bed to a chair (including a wheelchair)?: A Little Help needed standing up from a chair using your arms (e.g., wheelchair or bedside chair)?: A Little Help needed to walk in hospital room?: A Little Help needed climbing 3-5 steps with a railing? : A Little 6 Click Score: 18    End of Session Equipment Utilized During Treatment: Oxygen (3L/ abdominal binder) Activity Tolerance: Patient limited by fatigue Patient left: in bed;with call bell/phone within reach Nurse Communication: Mobility status PT Visit Diagnosis: Unsteadiness on feet (R26.81);Difficulty in walking, not elsewhere classified (R26.2);Muscle weakness (generalized) (M62.81)     Time: 1610-9604 PT Time Calculation (min) (ACUTE ONLY): 19 min  Charges:    $Gait Training: 8-22 mins PT General Charges $$ ACUTE PT VISIT: 1 Visit                    Jetta Lout PTA 07/31/23, 4:05 PM

## 2023-08-01 DIAGNOSIS — A419 Sepsis, unspecified organism: Secondary | ICD-10-CM | POA: Diagnosis not present

## 2023-08-01 DIAGNOSIS — R6521 Severe sepsis with septic shock: Secondary | ICD-10-CM | POA: Diagnosis not present

## 2023-08-01 LAB — BASIC METABOLIC PANEL WITH GFR
Anion gap: 9 (ref 5–15)
BUN: 16 mg/dL (ref 6–20)
CO2: 35 mmol/L — ABNORMAL HIGH (ref 22–32)
Calcium: 8.7 mg/dL — ABNORMAL LOW (ref 8.9–10.3)
Chloride: 89 mmol/L — ABNORMAL LOW (ref 98–111)
Creatinine, Ser: 0.92 mg/dL (ref 0.61–1.24)
GFR, Estimated: >60 mL/min (ref >60)
Glucose, Bld: 112 mg/dL — ABNORMAL HIGH (ref 70–99)
Potassium: 3 mmol/L — ABNORMAL LOW (ref 3.5–5.1)
Sodium: 133 mmol/L — ABNORMAL LOW (ref 135–145)

## 2023-08-01 LAB — MAGNESIUM: Magnesium: 2 mg/dL (ref 1.7–2.4)

## 2023-08-01 MED ORDER — IPRATROPIUM-ALBUTEROL 0.5-2.5 (3) MG/3ML IN SOLN
3.0000 mL | Freq: Two times a day (BID) | RESPIRATORY_TRACT | Status: DC
  Administered 2023-08-01: 3 mL via RESPIRATORY_TRACT
  Filled 2023-08-01: qty 3

## 2023-08-01 MED ORDER — MAGNESIUM SULFATE 2 GM/50ML IV SOLN
2.0000 g | Freq: Once | INTRAVENOUS | Status: AC
  Administered 2023-08-01: 2 g via INTRAVENOUS
  Filled 2023-08-01: qty 50

## 2023-08-01 MED ORDER — POTASSIUM CHLORIDE CRYS ER 20 MEQ PO TBCR
40.0000 meq | EXTENDED_RELEASE_TABLET | ORAL | Status: AC
  Administered 2023-08-01: 40 meq via ORAL
  Filled 2023-08-01: qty 2

## 2023-08-01 MED ORDER — ACETAZOLAMIDE 250 MG PO TABS
250.0000 mg | ORAL_TABLET | Freq: Once | ORAL | Status: AC
Start: 2023-08-01 — End: 2023-08-01
  Administered 2023-08-01: 250 mg via ORAL
  Filled 2023-08-01: qty 1

## 2023-08-01 NOTE — Progress Notes (Signed)
 PROGRESS NOTE    [name]  ZOX:[id number] DOB: Dec 13, 1965 DOA: 07/13/2023 PCP: Ailene Ravel, MD    Brief Narrative:  58 yo M with hx of obesity , OSA, , EtOH abuse hx of admission to hospital for ETOH withdrawal. He came in for elective surgery to have inguinal hernia repair with lysis of adhesions. He had significant bleeding per surgeon which was unusual and required electrocautery and ligature for hemostasis. He was brought back with abdominal JP drain and had FFP and platelets ordered due to intraoperative bleeding. After receiving these blood products patient deteriorated and developed circulatory shock, hypoxemia, swelling worse on lower extremities. He had code RRT called and was emergently brought to ICU due to shock with hypoxemia. He was treated for anaphylactic non febrile blood product reaction vs TRALI and improved overnight. He became stable and was downgraded to medical floor.    3/18 patient had bilateral inguinal hernia repair plus lysis of adhesions and exploratory laparotomy. 3/22.  Patient was brought to the operating room by Dr. Aleen Campi for fascial dehiscence with small bowel evisceration and handout exploratory laparotomy with abdominal washout and abdominal closure with retention sutures.   07/17/23- patient had code RRT overnight, he had findings of small bowel evisceration secondary to fascial dehiscence.  This likely stemmed from forceful cough per surgery service.  He became unstable in distress and required endotracheal intubation.  He was taken to OR emergently for ex-lap and closure of abdominal cavity.  Daughter was available to come in and we reviewed medical plan. CVl placed   07/18/23- extubated but reports severe pain. Daughter at bedside and patient is improved able to speak in full sentences.  Reviewed case with surgery and placed abdominal binder.  He is on CIWA due to reported daily drinking.    3/24 lethargic but arousable, remains  on pressors    3/25 remains on pressors, foley in place.  Central line removed and PICC line placed   3/26.  Medical team took over.  Foley catheter removed.  Patient spiked a fever of 101.  Blood cultures, chest x-ray, urine analysis and urine culture (after Foley removed).  Patient already on fluconazole and meropenem. 3/27.  Since MRSA PCR came back negative vancomycin was discontinued.  NG tube came out and needed to be replaced. 3/28.  Patient started on clear liquid diet.  Still has NG tube. 3/29.  Will continue IV Lasix on a daily basis with weight gain up to 395 pounds.  Patient states that he normally is around 350. 3/30.  Right arm swelling.  Ultrasound showed no DVT.  PICC line removed.  TPN stopped.  Started Lovenox injections. 3/31.  CT scan of the abdomen pelvis shows postoperative ileus.  CT scan of the chest shows pneumonia.  Still on meropenem.  Nystatin powder under skin folds.  Placed on solid food by general surgery 4/1.  Patient did have a poor night of sleep and is sleepy today.  White blood cell count down to 12.6 and procalcitonin down to 0.35.  Will discontinue meropenem today and monitor.  Change Lovenox injections over to Eliquis for this evening. 4/4: Patient overall diuresing well.  Sodium improving. 4/6: Continues to diurese well.  Kidney function stable.  Sodium bicarbonate increasing  Assessment & Plan:   Principal Problem:   Septic shock (HCC) Active Problems:   Fever   Perioperative dehiscence of abdominal wound with evisceration   Arm DVT (deep venous thromboembolism), acute, right (HCC)   Ventral hernia without  obstruction or gangrene   Inguinal hernia   Anaphylactic reaction   TRALI (transfusion related acute lung injury)   Thrombocytopenia (HCC)   Acute respiratory failure with hypoxia and hypercapnia (HCC)   History of alcohol abuse   AKI (acute kidney injury) (HCC)   Morbid obesity with BMI of 50.0-59.9, adult (HCC)   Weight gain   Hyperkalemia    Hyponatremia   Swelling of arm   Skin rash  Anasarca/fluid overload Hyponatremia, suspect hypovolemic, improving Contraction alkalosis Patient with significant pitting edema in bilateral lower extremities and right upper extremity.  Likely multifactorial in nature.  Has been receiving IV Lasix.  Serum sodium started to increase again after addition of metolazone and albumin 4/5: Bicarb elevated indicating evolving contraction alkalosis Plan: Continue Lasix 40 mg IV twice daily Diamox 250 mg x 1 Hold albumin Repeat renal function in a.m. Strict ins and outs  * Septic shock (HCC), resolved Secondary to abdominal wound dehiscence.   Patient completed meropenem on 4/1 and completed Diflucan on 3/29.   No shock physiology noted   Fever On 3/26.  No recurrence.  Chest x-ray showing multifocal pneumonia.  Already on meropenem.  Central line removed 3/25 and PICC line placed 3/25.  Foley catheter removed 3/25.  Blood cultures are negative for 5 days.  White blood cell count trended down 12.6.  Procalcitonin 0.35.  Meropenem stopped.  Continue monitoring vitals and fever curve   Perioperative dehiscence of abdominal wound with evisceration Completed antibiotics.  Discontinued TPN after PICC line removal.  Patient had NG tube removed on 3/29.  On clear liquid diet on 3/30.  General surgery advance diet to solid food on 3/31.  Seems to be tolerating.  General surgery will follow-up as outpatient.   Arm DVT (deep venous thromboembolism), acute, right (HCC) PICC line removed on 3/30.   Initially started on Lovenox.   Now on p.o. Eliquis and tolerating.   Skin rash Patient has some sloughing of the skin underneath skin folds and in the armpit.   As needed nystatin powder     Hyperkalemia Improved with 1 dose of Lokelma   Weight gain Patient states his normal rate is around 350 pounds.   Patient was as high as 395.95 pounds.  Started diuresis.  Continue to monitor weights. Bed weights and  accurate   Morbid obesity with BMI of 50.0-59.9, adult [facility]) Patient has had quite a bit of weight gain during the hospital course received quite a bit of fluids TPN and antibiotics.     AKI (acute kidney injury) (HCC) Resolved.  Creatinine 1.29 on 3/19.  Creatinine went as low to 0.7.     History of alcohol abuse On thiamine and folic acid   Acute respiratory failure with hypoxia and hypercapnia (HCC) Slowly improving.  Continue Lasix as above.  Currently on 3 L.  Albumin stopped.   Thrombocytopenia (HCC) Platelet count recovered to reference range   TRALI (transfusion related acute lung injury) During the hospital course.   Inguinal hernia Initial surgery.   DVT prophylaxis: Eliquis Code Status: Full Family Communication: Daughter at bedside 4/2, 4/3, 4/5 Disposition Plan: Status is: Inpatient Remains inpatient appropriate because: Anasarca/fluid overload on IV diuresis   Level of care: Med-Surg  Consultants:  None  Procedures:  Exploratory laparotomy  Antimicrobials: None   Subjective: Seen and examined.  Appears fatigued otherwise stable.  Mental status intact.  Answers all questions appropriately.  Objective: Vitals:   08/01/23 4098 08/01/23 1191 08/01/23 4782 08/01/23 9562  BP: 111/60   124/74  Pulse: 94   94  Resp: 16   16  Temp: 97.9 F (36.6 C)   (!) 97.3 F (36.3 C)  TempSrc: Oral     SpO2: 98% 95%  97%  Weight:   (!) 163.6 kg   Height:        Intake/Output Summary (Last 24 hours) at 08/01/2023 1212 Last data filed at 08/01/2023 1130 Gross per 24 hour  Intake 137.77 ml  Output 3000 ml  Net -2862.23 ml   Filed Weights   07/30/23 0500 07/31/23 0326 08/01/23 0713  Weight: (!) 163.4 kg (!) 163.8 kg (!) 163.6 kg    Examination:  General exam: NAD Respiratory system: Overall clear.  Bibasilar crackles.  Normal work of breathing.  3 L Cardiovascular system: S1-S2, RRR, no murmurs, 2+ pitting edema BLE Gastrointestinal system: Obese, soft,  NT/ND, decreased bowel sounds Central nervous system: Alert and oriented. No focal neurological deficits. Extremities: Bilateral lower extremities edematous.  Right upper extremity edematous. Skin: No rashes, lesions or ulcers Psychiatry: Judgement and insight appear normal. Mood & affect appropriate.     Data Reviewed: I have personally reviewed following labs and imaging studies  CBC: Recent Labs  Lab 07/27/23 0437 07/28/23 0536 07/29/23 0621  WBC 12.6* 9.8 7.9  NEUTROABS 9.5*  --  4.5  HGB 11.8* 11.8* 11.1*  HCT 34.1* 34.6* 32.3*  MCV 99.7 100.3* 100.6*  PLT 161 195 172   Basic Metabolic Panel: Recent Labs  Lab 07/28/23 0536 07/29/23 0621 07/30/23 0541 07/31/23 0431 08/01/23 0539  NA 127* 130* 132* 132* 133*  K 4.2 3.8 3.4* 3.3* 3.0*  CL 93* 95* 91* 90* 89*  CO2 28 28 32 34* 35*  GLUCOSE 102* 99 101* 115* 112*  BUN 23* 20 19 18 16   CREATININE 0.76 0.66 0.80 0.76 0.92  CALCIUM 8.0* 8.2* 8.5* 8.6* 8.7*  MG  --   --  1.8 1.9 2.0   GFR: Estimated Creatinine Clearance: 147.3 mL/min (by C-G formula based on SCr of 0.92 mg/dL). Liver Function Tests: Recent Labs  Lab 07/26/23 0558  AST 73*  ALT 33  ALKPHOS 75  BILITOT 2.1*  PROT 6.0*  ALBUMIN 1.7*   No results for input(s): "LIPASE", "AMYLASE" in the last 168 hours. No results for input(s): "AMMONIA" in the last 168 hours. Coagulation Profile: Recent Labs  Lab 07/25/23 1722  INR 1.4*   Cardiac Enzymes: No results for input(s): "CKTOTAL", "CKMB", "CKMBINDEX", "TROPONINI" in the last 168 hours. BNP (last 3 results) No results for input(s): "PROBNP" in the last 8760 hours. HbA1C: No results for input(s): "HGBA1C" in the last 72 hours. CBG: Recent Labs  Lab 07/29/23 0549 07/29/23 1150 07/29/23 1757 07/29/23 2314 07/30/23 0521  GLUCAP 105* 111* 111* 91 85   Lipid Profile: No results for input(s): "CHOL", "HDL", "LDLCALC", "TRIG", "CHOLHDL", "LDLDIRECT" in the last 72 hours.  Thyroid Function  Tests: No results for input(s): "TSH", "T4TOTAL", "FREET4", "T3FREE", "THYROIDAB" in the last 72 hours. Anemia Panel: No results for input(s): "VITAMINB12", "FOLATE", "FERRITIN", "TIBC", "IRON", "RETICCTPCT" in the last 72 hours. Sepsis Labs: Recent Labs  Lab 07/27/23 0437  PROCALCITON 0.35    No results found for this or any previous visit (from the past 240 hours).        Radiology Studies: No results found.      Scheduled Meds:  apixaban  10 mg Oral BID   Followed by   Melene Muller ON 08/03/2023] apixaban  5 mg Oral  BID   chlorpheniramine-HYDROcodone  10 mL Oral TID   feeding supplement  237 mL Oral TID BM   folic acid  1 mg Oral Daily   furosemide  40 mg Intravenous BID   ipratropium-albuterol  3 mL Nebulization BID   multivitamin with minerals  1 tablet Oral Daily   nystatin   Topical TID   pantoprazole  40 mg Oral QHS   potassium chloride  40 mEq Oral Q2H   thiamine  100 mg Oral Daily   Or   thiamine  100 mg Intravenous Daily   Continuous Infusions:     LOS: 19 days   Tresa Moore, MD [facility]   If 7PM-7AM, please contact night-coverage  08/01/2023, 12:12 PM

## 2023-08-01 NOTE — Plan of Care (Signed)
  Problem: Education: Goal: Knowledge of General Education information will improve Description: Including pain rating scale, medication(s)/side effects and non-pharmacologic comfort measures Outcome: Progressing   Problem: Activity: Goal: Risk for activity intolerance will decrease Outcome: Progressing   Problem: Nutrition: Goal: Adequate nutrition will be maintained Outcome: Progressing   Problem: Nutrition: Goal: Adequate nutrition will be maintained Outcome: Progressing   Problem: Pain Managment: Goal: General experience of comfort will improve and/or be controlled Outcome: Progressing   Problem: Safety: Goal: Ability to remain free from injury will improve Outcome: Progressing

## 2023-08-01 NOTE — Plan of Care (Signed)

## 2023-08-02 DIAGNOSIS — A419 Sepsis, unspecified organism: Secondary | ICD-10-CM | POA: Diagnosis not present

## 2023-08-02 DIAGNOSIS — R6521 Severe sepsis with septic shock: Secondary | ICD-10-CM | POA: Diagnosis not present

## 2023-08-02 LAB — MAGNESIUM: Magnesium: 2.1 mg/dL (ref 1.7–2.4)

## 2023-08-02 LAB — BASIC METABOLIC PANEL WITH GFR
Anion gap: 8 (ref 5–15)
BUN: 18 mg/dL (ref 6–20)
CO2: 32 mmol/L (ref 22–32)
Calcium: 9 mg/dL (ref 8.9–10.3)
Chloride: 94 mmol/L — ABNORMAL LOW (ref 98–111)
Creatinine, Ser: 0.88 mg/dL (ref 0.61–1.24)
GFR, Estimated: >60 mL/min (ref >60)
Glucose, Bld: 118 mg/dL — ABNORMAL HIGH (ref 70–99)
Potassium: 3.5 mmol/L (ref 3.5–5.1)
Sodium: 134 mmol/L — ABNORMAL LOW (ref 135–145)

## 2023-08-02 MED ORDER — IPRATROPIUM-ALBUTEROL 0.5-2.5 (3) MG/3ML IN SOLN: 3.0000 mL | Freq: Four times a day (QID) | RESPIRATORY_TRACT | Status: DC | PRN

## 2023-08-02 MED ORDER — ACETAZOLAMIDE 250 MG PO TABS
250.0000 mg | ORAL_TABLET | Freq: Once | ORAL | Status: AC
  Administered 2023-08-02: 250 mg via ORAL
  Filled 2023-08-02 (×2): qty 1

## 2023-08-02 NOTE — Progress Notes (Signed)
 PROGRESS NOTE    [name]  ZOX:[id number] DOB: January 28, 1966 DOA: 07/13/2023 PCP: Ailene Ravel, MD    Brief Narrative:  58 yo M with hx of obesity , OSA, , EtOH abuse hx of admission to hospital for ETOH withdrawal. He came in for elective surgery to have inguinal hernia repair with lysis of adhesions. He had significant bleeding per surgeon which was unusual and required electrocautery and ligature for hemostasis. He was brought back with abdominal JP drain and had FFP and platelets ordered due to intraoperative bleeding. After receiving these blood products patient deteriorated and developed circulatory shock, hypoxemia, swelling worse on lower extremities. He had code RRT called and was emergently brought to ICU due to shock with hypoxemia. He was treated for anaphylactic non febrile blood product reaction vs TRALI and improved overnight. He became stable and was downgraded to medical floor.    3/18 patient had bilateral inguinal hernia repair plus lysis of adhesions and exploratory laparotomy. 3/22.  Patient was brought to the operating room by Dr. Aleen Campi for fascial dehiscence with small bowel evisceration and handout exploratory laparotomy with abdominal washout and abdominal closure with retention sutures.   07/17/23- patient had code RRT overnight, he had findings of small bowel evisceration secondary to fascial dehiscence.  This likely stemmed from forceful cough per surgery service.  He became unstable in distress and required endotracheal intubation.  He was taken to OR emergently for ex-lap and closure of abdominal cavity.  Daughter was available to come in and we reviewed medical plan. CVl placed   07/18/23- extubated but reports severe pain. Daughter at bedside and patient is improved able to speak in full sentences.  Reviewed case with surgery and placed abdominal binder.  He is on CIWA due to reported daily drinking.    3/24 lethargic but arousable, remains  on pressors    3/25 remains on pressors, foley in place.  Central line removed and PICC line placed   3/26.  Medical team took over.  Foley catheter removed.  Patient spiked a fever of 101.  Blood cultures, chest x-ray, urine analysis and urine culture (after Foley removed).  Patient already on fluconazole and meropenem. 3/27.  Since MRSA PCR came back negative vancomycin was discontinued.  NG tube came out and needed to be replaced. 3/28.  Patient started on clear liquid diet.  Still has NG tube. 3/29.  Will continue IV Lasix on a daily basis with weight gain up to 395 pounds.  Patient states that he normally is around 350. 3/30.  Right arm swelling.  Ultrasound showed no DVT.  PICC line removed.  TPN stopped.  Started Lovenox injections. 3/31.  CT scan of the abdomen pelvis shows postoperative ileus.  CT scan of the chest shows pneumonia.  Still on meropenem.  Nystatin powder under skin folds.  Placed on solid food by general surgery 4/1.  Patient did have a poor night of sleep and is sleepy today.  White blood cell count down to 12.6 and procalcitonin down to 0.35.  Will discontinue meropenem today and monitor.  Change Lovenox injections over to Eliquis for this evening. 4/4: Patient overall diuresing well.  Sodium improving. 4/6: Continues to diurese well.  Kidney function stable.  Sodium bicarbonate increasing  Assessment & Plan:   Principal Problem:   Septic shock (HCC) Active Problems:   Fever   Perioperative dehiscence of abdominal wound with evisceration   Arm DVT (deep venous thromboembolism), acute, right (HCC)   Ventral hernia without  obstruction or gangrene   Inguinal hernia   Anaphylactic reaction   TRALI (transfusion related acute lung injury)   Thrombocytopenia (HCC)   Acute respiratory failure with hypoxia and hypercapnia (HCC)   History of alcohol abuse   AKI (acute kidney injury) (HCC)   Morbid obesity with BMI of 50.0-59.9, adult (HCC)   Weight gain   Hyperkalemia    Hyponatremia   Swelling of arm   Skin rash  Anasarca/fluid overload Hyponatremia, suspect hypovolemic, improving Contraction alkalosis Patient with significant pitting edema in bilateral lower extremities and right upper extremity.  Likely multifactorial in nature.  Has been receiving IV Lasix.  Serum sodium started to increase again after addition of metolazone and albumin 4/5: Bicarb elevated indicating evolving contraction alkalosis 4/7: Serum bicarbonate improving Plan: Continue Lasix 40 mg IV twice daily Repeat Diamox to 150 mg x 1 Hold albumin Repeat renal function in a.m. Strict ins and outs  * Septic shock (HCC), resolved Secondary to abdominal wound dehiscence.   Patient completed meropenem on 4/1 and completed Diflucan on 3/29.   No shock physiology noted   Fever On 3/26.  No recurrence.  Chest x-ray showing multifocal pneumonia.  Already on meropenem.  Central line removed 3/25 and PICC line placed 3/25.  Foley catheter removed 3/25.  Blood cultures are negative for 5 days.  White blood cell count trended down 12.6.  Procalcitonin 0.35.  Meropenem stopped.  Continue monitoring vitals and fever curve   Perioperative dehiscence of abdominal wound with evisceration Completed antibiotics.  Discontinued TPN after PICC line removal.  Patient had NG tube removed on 3/29.  On clear liquid diet on 3/30.  General surgery advance diet to solid food on 3/31.  Seems to be tolerating.  General surgery will follow-up as outpatient.  If patient remains here through the next few days we will reach out to general surgery to evaluate wound while admitted   Arm DVT (deep venous thromboembolism), acute, right (HCC) PICC line removed on 3/30.   Initially started on Lovenox.   Now on p.o. Eliquis and tolerating.   Skin rash Patient has some sloughing of the skin underneath skin folds and in the armpit.   As needed nystatin powder     Hyperkalemia Improved with 1 dose of Lokelma   Weight  gain Patient states his normal rate is around 350 pounds.   Patient was as high as 395.95 pounds.  Started diuresis.  Continue to monitor weights. Bed weights and accurate   Morbid obesity with BMI of 50.0-59.9, adult [facility]) Patient has had quite a bit of weight gain during the hospital course received quite a bit of fluids TPN and antibiotics.     AKI (acute kidney injury) (HCC) Resolved.  Creatinine 1.29 on 3/19.  Creatinine went as low to 0.7.     History of alcohol abuse On thiamine and folic acid   Acute respiratory failure with hypoxia and hypercapnia (HCC) Slowly improving.  Continue Lasix as above.  Currently on 3 L.  Albumin stopped.   Thrombocytopenia (HCC) Platelet count recovered to reference range   TRALI (transfusion related acute lung injury) During the hospital course.   Inguinal hernia Initial surgery.   DVT prophylaxis: Eliquis Code Status: Full Family Communication: Daughter at bedside 4/2, 4/3, 4/5, 4/7 Disposition Plan: Status is: Inpatient Remains inpatient appropriate because: Anasarca/fluid overload on IV diuresis   Level of care: Med-Surg  Consultants:  None  Procedures:  Exploratory laparotomy  Antimicrobials: None   Subjective: Seen and  examined.  Fatigued otherwise stable.  Diuresing extremely well.  Objective: Vitals:   08/01/23 2034 08/01/23 2100 08/02/23 0500 08/02/23 0900  BP:  125/65 110/64 123/64  Pulse:  93 91 88  Resp:    16  Temp:  (!) 97.5 F (36.4 C) 97.9 F (36.6 C) 97.9 F (36.6 C)  TempSrc:  Oral Oral Oral  SpO2: 96% 97% 94% 98%  Weight:   (!) 153.2 kg   Height:        Intake/Output Summary (Last 24 hours) at 08/02/2023 1116 Last data filed at 08/02/2023 0900 Gross per 24 hour  Intake 300 ml  Output 5075 ml  Net -4775 ml   Filed Weights   07/31/23 0326 08/01/23 0713 08/02/23 0500  Weight: (!) 163.8 kg (!) 163.6 kg (!) 153.2 kg    Examination:  General exam: No acute distress.  Appears  fatigued Respiratory system: Bibasilar close.  Normal work of breathing.  3 L Cardiovascular system: S1-S2, RRR, no murmurs, 2+ pitting edema BLE Gastrointestinal system: Obese, soft, NT/ND, decreased bowel sounds Central nervous system: Alert and oriented. No focal neurological deficits. Extremities: Bilateral lower extremities edematous.  Right upper extremity edematous. Skin: No rashes, lesions or ulcers Psychiatry: Judgement and insight appear normal. Mood & affect appropriate.     Data Reviewed: I have personally reviewed following labs and imaging studies  CBC: Recent Labs  Lab 07/27/23 0437 07/28/23 0536 07/29/23 0621  WBC 12.6* 9.8 7.9  NEUTROABS 9.5*  --  4.5  HGB 11.8* 11.8* 11.1*  HCT 34.1* 34.6* 32.3*  MCV 99.7 100.3* 100.6*  PLT 161 195 172   Basic Metabolic Panel: Recent Labs  Lab 07/29/23 0621 07/30/23 0541 07/31/23 0431 08/01/23 0539 08/02/23 0509  NA 130* 132* 132* 133* 134*  K 3.8 3.4* 3.3* 3.0* 3.5  CL 95* 91* 90* 89* 94*  CO2 28 32 34* 35* 32  GLUCOSE 99 101* 115* 112* 118*  BUN 20 19 18 16 18   CREATININE 0.66 0.80 0.76 0.92 0.88  CALCIUM 8.2* 8.5* 8.6* 8.7* 9.0  MG  --  1.8 1.9 2.0 2.1   GFR: Estimated Creatinine Clearance: 148.4 mL/min (by C-G formula based on SCr of 0.88 mg/dL). Liver Function Tests: No results for input(s): "AST", "ALT", "ALKPHOS", "BILITOT", "PROT", "ALBUMIN" in the last 168 hours.  No results for input(s): "LIPASE", "AMYLASE" in the last 168 hours. No results for input(s): "AMMONIA" in the last 168 hours. Coagulation Profile: No results for input(s): "INR", "PROTIME" in the last 168 hours.  Cardiac Enzymes: No results for input(s): "CKTOTAL", "CKMB", "CKMBINDEX", "TROPONINI" in the last 168 hours. BNP (last 3 results) No results for input(s): "PROBNP" in the last 8760 hours. HbA1C: No results for input(s): "HGBA1C" in the last 72 hours. CBG: Recent Labs  Lab 07/29/23 0549 07/29/23 1150 07/29/23 1757  07/29/23 2314 07/30/23 0521  GLUCAP 105* 111* 111* 91 85   Lipid Profile: No results for input(s): "CHOL", "HDL", "LDLCALC", "TRIG", "CHOLHDL", "LDLDIRECT" in the last 72 hours.  Thyroid Function Tests: No results for input(s): "TSH", "T4TOTAL", "FREET4", "T3FREE", "THYROIDAB" in the last 72 hours. Anemia Panel: No results for input(s): "VITAMINB12", "FOLATE", "FERRITIN", "TIBC", "IRON", "RETICCTPCT" in the last 72 hours. Sepsis Labs: Recent Labs  Lab 07/27/23 0437  PROCALCITON 0.35    No results found for this or any previous visit (from the past 240 hours).        Radiology Studies: No results found.      Scheduled Meds:  acetaZOLAMIDE  250  mg Oral Once   apixaban  10 mg Oral BID   Followed by   Melene Muller ON 08/03/2023] apixaban  5 mg Oral BID   chlorpheniramine-HYDROcodone  10 mL Oral TID   feeding supplement  237 mL Oral TID BM   folic acid  1 mg Oral Daily   furosemide  40 mg Intravenous BID   multivitamin with minerals  1 tablet Oral Daily   nystatin   Topical TID   pantoprazole  40 mg Oral QHS   thiamine  100 mg Oral Daily   Or   thiamine  100 mg Intravenous Daily   Continuous Infusions:     LOS: 20 days   Tresa Moore, MD [facility]   If 7PM-7AM, please contact night-coverage  08/02/2023, 11:16 AM

## 2023-08-02 NOTE — Progress Notes (Signed)
 OT Cancellation Note  Patient Details Name: [name] MRN: [id number] DOB: [birth year]   Cancelled Treatment:    Reason Eval/Treat Not Completed: Patient declined, no reason specified. Pt having worked with PT this afternoon and declines OT intervention. OT to re-attempt when next available.   Jackquline Denmark, MS, OTR/L , [facility]IS ascom [phone]  08/02/23, 2:39 PM

## 2023-08-02 NOTE — Progress Notes (Addendum)
 Physical Therapy Treatment Patient Details Name: [name] MRN: [id number] DOB: 09/12/1965 Today's Date: 08/02/2023   History of Present Illness Patient is a 58 year old male here for elective  inguinal hernia repair. Had bleeding  required electrocautery and ligature for hemostasis. After getting blood products, patient deteriorated, treated for shock and hypoxemia. Had rapid response with small bowel evisceration secondary to fascial dehiscence requiring surgery for evisercation and retention suture placement. Requiring pressor support.    PT Comments  Patient alert, agreeable to PT with some encouragement, reported 1/10 pain in his back. Supine <> sit supervision, with use of bed rail, able to sit with fair balance. Sit <> stand with RW and supervision, and able to walk ~59ft in room with supervision as well. Pt declined a further distance due to fatigue, and did report that this distance was double what he would need to walk at home. Pt/PT discussed discharge options and help available at home, team updated. He would benefit from further skilled PT intervention to maximize function, safety, and independence.     If plan is discharge home, recommend the following: Assistance with cooking/housework;Assist for transportation;Help with stairs or ramp for entrance;A little help with walking and/or transfers;A little help with bathing/dressing/bathroom   Can travel by private vehicle     Yes  Equipment Recommendations  Hospital bed;BSC/3in1;Rolling walker (2 wheels)    Recommendations for Other Services Rehab consult     Precautions / Restrictions Precautions Precautions: Fall Recall of Precautions/Restrictions: Intact Precaution/Restrictions Comments: abdominal binder Restrictions Weight Bearing Restrictions Per Provider Order: No     Mobility  Bed Mobility Overal bed mobility: Needs Assistance Bed Mobility: Supine to Sit, Sit to Supine   Sidelying to sit: Supervision, Used  rails   Sit to supine: Supervision, Used rails        Transfers Overall transfer level: Needs assistance Equipment used: Rolling walker (2 wheels) Transfers: Sit to/from Stand Sit to Stand: Supervision                Ambulation/Gait Ambulation/Gait assistance: Supervision, Contact guard assist Gait Distance (Feet): 40 Feet Assistive device: Rolling walker (2 wheels)         General Gait Details: very decreased gait velocity, encouragement to maximize distance   Stairs             Wheelchair Mobility     Tilt Bed    Modified Rankin (Stroke Patients Only)       Balance Overall balance assessment: Needs assistance Sitting-balance support: Feet supported Sitting balance-Leahy Scale: Good     Standing balance support: Bilateral upper extremity supported, Reliant on assistive device for balance Standing balance-Leahy Scale: Fair                              Communication    Cognition Arousal: Alert Behavior During Therapy: WFL for tasks assessed/performed   PT - Cognitive impairments: No apparent impairments                                Cueing    Exercises      General Comments        Pertinent Vitals/Pain Pain Assessment Pain Assessment: 0-10 Pain Score: 1  Pain Location: back Pain Descriptors / Indicators: Aching Pain Intervention(s): Limited activity within patient's tolerance, Monitored during session, Premedicated before session, Repositioned    Home Living  Prior Function            PT Goals (current goals can now be found in the care plan section) Progress towards PT goals: Progressing toward goals    Frequency    Min 3X/week      PT Plan      Co-evaluation PT/OT/SLP Co-Evaluation/Treatment: Yes            AM-PAC PT "6 Clicks" Mobility   Outcome Measure  Help needed turning from your back to your side while in a flat bed without using  bedrails?: A Little Help needed moving from lying on your back to sitting on the side of a flat bed without using bedrails?: A Little Help needed moving to and from a bed to a chair (including a wheelchair)?: A Little Help needed standing up from a chair using your arms (e.g., wheelchair or bedside chair)?: A Little Help needed to walk in hospital room?: A Little Help needed climbing 3-5 steps with a railing? : A Little 6 Click Score: 18    End of Session Equipment Utilized During Treatment: Oxygen Activity Tolerance: Patient limited by fatigue Patient left: in bed;with call bell/phone within reach Nurse Communication: Mobility status PT Visit Diagnosis: Unsteadiness on feet (R26.81);Difficulty in walking, not elsewhere classified (R26.2);Muscle weakness (generalized) (M62.81) Pain - Right/Left:  (central abdomen) Pain - part of body:  (abdomen)     Time: 1353-1410 PT Time Calculation (min) (ACUTE ONLY): 17 min  Charges:    $Therapeutic Activity: 8-22 mins PT General Charges $$ ACUTE PT VISIT: 1 Visit                     Olga Coaster PT, DPT 3:45 PM,08/02/23

## 2023-08-03 DIAGNOSIS — R6521 Severe sepsis with septic shock: Secondary | ICD-10-CM | POA: Diagnosis not present

## 2023-08-03 DIAGNOSIS — A419 Sepsis, unspecified organism: Secondary | ICD-10-CM | POA: Diagnosis not present

## 2023-08-03 LAB — BASIC METABOLIC PANEL WITH GFR
Anion gap: 7 (ref 5–15)
BUN: 19 mg/dL (ref 6–20)
CO2: 28 mmol/L (ref 22–32)
Calcium: 9 mg/dL (ref 8.9–10.3)
Chloride: 96 mmol/L — ABNORMAL LOW (ref 98–111)
Creatinine, Ser: 0.89 mg/dL (ref 0.61–1.24)
GFR, Estimated: >60 mL/min (ref >60)
Glucose, Bld: 113 mg/dL — ABNORMAL HIGH (ref 70–99)
Potassium: 3.2 mmol/L — ABNORMAL LOW (ref 3.5–5.1)
Sodium: 131 mmol/L — ABNORMAL LOW (ref 135–145)

## 2023-08-03 LAB — CBC
HCT: 34.9 % — ABNORMAL LOW (ref 39.0–52.0)
Hemoglobin: 12.2 g/dL — ABNORMAL LOW (ref 13.0–17.0)
MCH: 34.1 pg — ABNORMAL HIGH (ref 26.0–34.0)
MCHC: 35 g/dL (ref 30.0–36.0)
MCV: 97.5 fL (ref 80.0–100.0)
Platelets: 275 10*3/uL (ref 150–400)
RBC: 3.58 MIL/uL — ABNORMAL LOW (ref 4.22–5.81)
RDW: 13.7 % (ref 11.5–15.5)
WBC: 9.4 10*3/uL (ref 4.0–10.5)
nRBC: 0 % (ref 0.0–0.2)

## 2023-08-03 LAB — MAGNESIUM: Magnesium: 1.9 mg/dL (ref 1.7–2.4)

## 2023-08-03 MED ORDER — VITAMIN C 500 MG PO TABS
500.0000 mg | ORAL_TABLET | Freq: Two times a day (BID) | ORAL | Status: DC
  Administered 2023-08-03 – 2023-08-05 (×4): 500 mg via ORAL
  Filled 2023-08-03 (×4): qty 1

## 2023-08-03 MED ORDER — POTASSIUM CHLORIDE CRYS ER 20 MEQ PO TBCR
40.0000 meq | EXTENDED_RELEASE_TABLET | Freq: Once | ORAL | Status: AC
  Administered 2023-08-03: 40 meq via ORAL
  Filled 2023-08-03: qty 2

## 2023-08-03 MED ORDER — MAGNESIUM SULFATE 2 GM/50ML IV SOLN
2.0000 g | Freq: Once | INTRAVENOUS | Status: AC
Start: 2023-08-03 — End: 2023-08-03
  Administered 2023-08-03: 2 g via INTRAVENOUS
  Filled 2023-08-03: qty 50

## 2023-08-03 NOTE — Progress Notes (Signed)
 Nutrition Follow Up Note   DOCUMENTATION CODES:   Morbid obesity  INTERVENTION:   Ensure Enlive po TID, each supplement provides 350 kcal and 20 grams of protein.  MVI po daily   Vitamin C 500mg  po BID   Daily weights   NUTRITION DIAGNOSIS:   Inadequate oral intake related to altered GI function as evidenced by NPO status. -resolved   GOAL:   Patient will meet greater than or equal to 90% of their needs -progressing   MONITOR:   PO intake, Supplement acceptance, Labs, Weight trends, Skin, I & O's  ASSESSMENT:   58 y/o male with h/o etoh abuse, HTN, OSA, asthma, depression, GERD, incarcerated ventral hernia s/p repair 2014 (no resections), hydrocele s/p right hydrocelectomy 2018 and bilateral inguinal hernias who is admitted for elective right inguinal hernia repair & intra-abdominal adhesions s/p diagnostic laparoscopy, converted to exploratory laparotomy & lysis of adhesions with aborted hernia repair 3/18 secondary to bleeding, hematoma and shock complicated by fascial dehiscence with small bowel evisceration s/p exploratory laparotomy, abdominal washout, abdominal closure with retention sutures 3/22 complicated by post op ileus.  Pt continues to have good appetite and oral intake in hospital; pt eating 100% of most meals and is drinking the Ensure supplements. Per chart, pt is down ~48lbs since admission. Pt is down ~16lbs from his UBW. Recommend continue supplements and MVI. RD will add vitamin C to support wound healing.   Medications reviewed and include: folic acid, lasix, MVI, protonix, thiamine  Labs reviewed: Na 131(L), K 3.2(L), Mg 1.9 wnl  UOP-   Diet Order:   Diet Order             Diet regular Room service appropriate? Yes; Fluid consistency: Thin; Fluid restriction: 1500 mL Fluid  Diet effective now                  EDUCATION NEEDS:   No education needs have been identified at this time  Skin:  Skin Assessment: Reviewed RN Assessment  (surgical abdominal wound)  Last BM:  4/8- TYPE 7  Height:   Ht Readings from Last 1 Encounters:  07/13/23 6\' 2"  (1.88 m)    Weight:   Wt Readings from Last 1 Encounters:  08/03/23 (!) 152.9 kg    Ideal Body Weight:  86.3 kg  BMI:  Body mass index is 43.27 kg/m.  Estimated Nutritional Needs:   Kcal:  2700-3000kcal/day  Protein:  135-150g/day  Fluid:  2.7-3.0L/day  Betsey Holiday MS, RD, LDN If unable to be reached, please send secure chat to "RD inpatient" available from 8:00a-4:00p daily

## 2023-08-03 NOTE — Progress Notes (Signed)
 PROGRESS NOTE    [name]  ZOX:[id number] DOB: [birth year] DOA: 07/13/2023 PCP: Ailene Ravel, MD    Brief Narrative:  57 yo M with hx of obesity , OSA, , EtOH abuse hx of admission to hospital for ETOH withdrawal. He came in for elective surgery to have inguinal hernia repair with lysis of adhesions. He had significant bleeding per surgeon which was unusual and required electrocautery and ligature for hemostasis. He was brought back with abdominal JP drain and had FFP and platelets ordered due to intraoperative bleeding. After receiving these blood products patient deteriorated and developed circulatory shock, hypoxemia, swelling worse on lower extremities. He had code RRT called and was emergently brought to ICU due to shock with hypoxemia. He was treated for anaphylactic non febrile blood product reaction vs TRALI and improved overnight. He became stable and was downgraded to medical floor.    3/18 patient had bilateral inguinal hernia repair plus lysis of adhesions and exploratory laparotomy. 3/22.  Patient was brought to the operating room by Dr. Aleen Campi for fascial dehiscence with small bowel evisceration and handout exploratory laparotomy with abdominal washout and abdominal closure with retention sutures.   07/17/23- patient had code RRT overnight, he had findings of small bowel evisceration secondary to fascial dehiscence.  This likely stemmed from forceful cough per surgery service.  He became unstable in distress and required endotracheal intubation.  He was taken to OR emergently for ex-lap and closure of abdominal cavity.  Daughter was available to come in and we reviewed medical plan. CVl placed   07/18/23- extubated but reports severe pain. Daughter at bedside and patient is improved able to speak in full sentences.  Reviewed case with surgery and placed abdominal binder.  He is on CIWA due to reported daily drinking.    3/24 lethargic but arousable, remains  on pressors    3/25 remains on pressors, foley in place.  Central line removed and PICC line placed   3/26.  Medical team took over.  Foley catheter removed.  Patient spiked a fever of 101.  Blood cultures, chest x-ray, urine analysis and urine culture (after Foley removed).  Patient already on fluconazole and meropenem. 3/27.  Since MRSA PCR came back negative vancomycin was discontinued.  NG tube came out and needed to be replaced. 3/28.  Patient started on clear liquid diet.  Still has NG tube. 3/29.  Will continue IV Lasix on a daily basis with weight gain up to 395 pounds.  Patient states that he normally is around 350. 3/30.  Right arm swelling.  Ultrasound showed no DVT.  PICC line removed.  TPN stopped.  Started Lovenox injections. 3/31.  CT scan of the abdomen pelvis shows postoperative ileus.  CT scan of the chest shows pneumonia.  Still on meropenem.  Nystatin powder under skin folds.  Placed on solid food by general surgery 4/1.  Patient did have a poor night of sleep and is sleepy today.  White blood cell count down to 12.6 and procalcitonin down to 0.35.  Will discontinue meropenem today and monitor.  Change Lovenox injections over to Eliquis for this evening. 4/4: Patient overall diuresing well.  Sodium improving. 4/6: Continues to diurese well.  Kidney function stable.  Sodium bicarbonate increasing 4/8: Continues to diurese well.  Kidney function stable.  Sodium starting to decline.  Assessment & Plan:   Principal Problem:   Septic shock (HCC) Active Problems:   Fever   Perioperative dehiscence of abdominal wound with evisceration  Arm DVT (deep venous thromboembolism), acute, right (HCC)   Ventral hernia without obstruction or gangrene   Inguinal hernia   Anaphylactic reaction   TRALI (transfusion related acute lung injury)   Thrombocytopenia (HCC)   Acute respiratory failure with hypoxia and hypercapnia (HCC)   History of alcohol abuse   AKI (acute kidney injury) (HCC)   Morbid  obesity with BMI of 50.0-59.9, adult (HCC)   Weight gain   Hyperkalemia   Hyponatremia   Swelling of arm   Skin rash  Anasarca/fluid overload Hyponatremia, multifactorial Contraction alkalosis Patient with significant pitting edema in bilateral lower extremities and right upper extremity.  Likely multifactorial in nature.  Has been receiving IV Lasix.  Serum sodium started to increase again after addition of metolazone and albumin 4/5: Bicarb elevated indicating evolving contraction alkalosis 4/7: Serum bicarbonate improving /8: Sodium starting to decrease again Plan: Continue Lasix 40 mg IV twice daily Hold Diamox today Hold albumin Repeat renal function in a.m. Strict ins and outs Daily weights, standing if possible  * Septic shock (HCC), resolved Secondary to abdominal wound dehiscence.   Patient completed meropenem on 4/1 and completed Diflucan on 3/29.   No shock physiology noted   Fever On 3/26.  No recurrence.  Chest x-ray showing multifocal pneumonia.  Already on meropenem.  Central line removed 3/25 and PICC line placed 3/25.  Foley catheter removed 3/25.  Blood cultures are negative for 5 days.  White blood cell count trended down 12.6.  Procalcitonin 0.35.  Meropenem stopped.  Continue monitoring vitals and fever curve   Perioperative dehiscence of abdominal wound with evisceration Completed antibiotics.  Discontinued TPN after PICC line removal.  Patient had NG tube removed on 3/29.  On clear liquid diet on 3/30.  General surgery advance diet to solid food on 3/31.  Seems to be tolerating.   Plan: Case discussed with Dr. Tonna Boehringer.  He will evaluate abdominal wound on 4/9 at bedside.  Arm DVT (deep venous thromboembolism), acute, right (HCC) PICC line removed on 3/30.   Initially started on Lovenox.   Now on p.o. Eliquis and tolerating.   Skin rash Patient has some sloughing of the skin underneath skin folds and in the armpit.   As needed nystatin powder      Hyperkalemia Improved with 1 dose of Lokelma   Weight gain Patient states his normal rate is around 350 pounds.   Patient was as high as 395.95 pounds.  Started diuresis.   Daily weights, bed weights likely inaccurate.  Standing weights if possible.  Morbid obesity with BMI of 50.0-59.9, adult [facility]) Patient has had quite a bit of weight gain during the hospital course received quite a bit of fluids TPN and antibiotics.     AKI (acute kidney injury) (HCC) Resolved.  Creatinine 1.29 on 3/19.  Creatinine went as low to 0.7.     History of alcohol abuse On thiamine and folic acid   Acute respiratory failure with hypoxia and hypercapnia (HCC) Slowly improving.  Continue Lasix as above.  Currently on 3 L.  Albumin stopped.   Thrombocytopenia (HCC) Platelet count recovered to reference range   TRALI (transfusion related acute lung injury) During the hospital course.   Inguinal hernia Initial surgery.   DVT prophylaxis: Eliquis Code Status: Full Family Communication: Daughter at bedside 4/2, 4/3, 4/5, 4/7, 4/8 Disposition Plan: Status is: Inpatient Remains inpatient appropriate because: Anasarca/fluid overload on IV diuresis   Level of care: Med-Surg  Consultants:  None  Procedures:  Exploratory laparotomy  Antimicrobials: None   Subjective: Seen and examined.  Appears fatigued otherwise stable.  Diuresing well.  Objective: Vitals:   08/03/23 0032 08/03/23 0314 08/03/23 0500 08/03/23 0838  BP:  126/75  119/67  Pulse:  99  91  Resp:  16  14  Temp:  (!) 97.5 F (36.4 C)  97.7 F (36.5 C)  TempSrc:    Oral  SpO2:  96%  95%  Weight: (!) 155.7 kg  (!) 152.9 kg   Height:        Intake/Output Summary (Last 24 hours) at 08/03/2023 1011 Last data filed at 08/03/2023 0354 Gross per 24 hour  Intake 670 ml  Output 1850 ml  Net -1180 ml   Filed Weights   08/02/23 0500 08/03/23 0032 08/03/23 0500  Weight: (!) 153.2 kg (!) 155.7 kg (!) 152.9 kg     Examination:  General exam: NAD.  Appears fatigued Respiratory system: Crackles at bases.  Normal work of breathing.  3 L Cardiovascular system: S1-S2, RRR, no murmurs, 2+ pitting edema BLE Gastrointestinal system: Obese, soft, NT/ND, decreased bowel sounds, midline abdominal incision Central nervous system: Alert and oriented. No focal neurological deficits. Extremities: Bilateral lower extremities edematous.  Right upper extremity edematous. Skin: No rashes, lesions or ulcers Psychiatry: Judgement and insight appear normal. Mood & affect appropriate.     Data Reviewed: I have personally reviewed following labs and imaging studies  CBC: Recent Labs  Lab 07/28/23 0536 07/29/23 0621 08/03/23 0442  WBC 9.8 7.9 9.4  NEUTROABS  --  4.5  --   HGB 11.8* 11.1* 12.2*  HCT 34.6* 32.3* 34.9*  MCV 100.3* 100.6* 97.5  PLT 195 172 275   Basic Metabolic Panel: Recent Labs  Lab 07/30/23 0541 07/31/23 0431 08/01/23 0539 08/02/23 0509 08/03/23 0442  NA 132* 132* 133* 134* 131*  K 3.4* 3.3* 3.0* 3.5 3.2*  CL 91* 90* 89* 94* 96*  CO2 32 34* 35* 32 28  GLUCOSE 101* 115* 112* 118* 113*  BUN 19 18 16 18 19   CREATININE 0.80 0.76 0.92 0.88 0.89  CALCIUM 8.5* 8.6* 8.7* 9.0 9.0  MG 1.8 1.9 2.0 2.1 1.9   GFR: Estimated Creatinine Clearance: 146.6 mL/min (by C-G formula based on SCr of 0.89 mg/dL). Liver Function Tests: No results for input(s): "AST", "ALT", "ALKPHOS", "BILITOT", "PROT", "ALBUMIN" in the last 168 hours.  No results for input(s): "LIPASE", "AMYLASE" in the last 168 hours. No results for input(s): "AMMONIA" in the last 168 hours. Coagulation Profile: No results for input(s): "INR", "PROTIME" in the last 168 hours.  Cardiac Enzymes: No results for input(s): "CKTOTAL", "CKMB", "CKMBINDEX", "TROPONINI" in the last 168 hours. BNP (last 3 results) No results for input(s): "PROBNP" in the last 8760 hours. HbA1C: No results for input(s): "HGBA1C" in the last 72  hours. CBG: Recent Labs  Lab 07/29/23 0549 07/29/23 1150 07/29/23 1757 07/29/23 2314 07/30/23 0521  GLUCAP 105* 111* 111* 91 85   Lipid Profile: No results for input(s): "CHOL", "HDL", "LDLCALC", "TRIG", "CHOLHDL", "LDLDIRECT" in the last 72 hours.  Thyroid Function Tests: No results for input(s): "TSH", "T4TOTAL", "FREET4", "T3FREE", "THYROIDAB" in the last 72 hours. Anemia Panel: No results for input(s): "VITAMINB12", "FOLATE", "FERRITIN", "TIBC", "IRON", "RETICCTPCT" in the last 72 hours. Sepsis Labs: No results for input(s): "PROCALCITON", "LATICACIDVEN" in the last 168 hours.   No results found for this or any previous visit (from the past 240 hours).        Radiology Studies: No results  found.      Scheduled Meds:  apixaban  5 mg Oral BID   chlorpheniramine-HYDROcodone  10 mL Oral TID   feeding supplement  237 mL Oral TID BM   folic acid  1 mg Oral Daily   furosemide  40 mg Intravenous BID   multivitamin with minerals  1 tablet Oral Daily   nystatin   Topical TID   pantoprazole  40 mg Oral QHS   thiamine  100 mg Oral Daily   Or   thiamine  100 mg Intravenous Daily   Continuous Infusions:  magnesium sulfate bolus IVPB        LOS: 21 days   Tresa Moore, MD [facility]   If 7PM-7AM, please contact night-coverage  08/03/2023, 10:11 AM

## 2023-08-03 NOTE — Plan of Care (Signed)

## 2023-08-03 NOTE — Progress Notes (Signed)
 Occupational Therapy Treatment Patient Details Name: [name] MRN: [id number] DOB: 08-10-1965 Today's Date: 08/03/2023   History of present illness Patient is a 58 year old male here for elective  inguinal hernia repair. Had bleeding  required electrocautery and ligature for hemostasis. After getting blood products, patient deteriorated, treated for shock and hypoxemia. Had rapid response with small bowel evisceration secondary to fascial dehiscence requiring surgery for evisercation and retention suture placement. Requiring pressor support.   OT comments  Pt seen for OT treatment on this date. Upon arrival to room pt seated on EOB, agreeable to tx. Pt amb BR with CGA + RW, slow but steady gait throughout. 3L oxygen Spo2 levels 90s. Pt required MAXA for pericare post BM, completed in standing. Amb ~74ft with RW + CGA that progressed to supervision. Pt retired in [employer] with all needs in reach. VVS throughout. Pt making good progress toward goals, will continue to follow POC. Discharge recommendation remains appropriate.        If plan is discharge home, recommend the following:  A lot of help with bathing/dressing/bathroom;Help with stairs or ramp for entrance;Assist for transportation;Assistance with cooking/housework;A little help with walking and/or transfers   Equipment Recommendations  BSC/3in1    Recommendations for Other Services      Precautions / Restrictions Precautions Precautions: Fall Recall of Precautions/Restrictions: Intact Precaution/Restrictions Comments: abdominal binder Restrictions Weight Bearing Restrictions Per Provider Order: No       Mobility Bed Mobility Overal bed mobility: Needs Assistance             General bed mobility comments: Pt seated on EOB on arrival to room.    Transfers Overall transfer level: Needs assistance Equipment used: Rolling walker (2 wheels) Transfers: Sit to/from Stand, Bed to chair/wheelchair/BSC Sit to Stand:  Supervision           General transfer comment: Pt STS from EOB with bariatric RW with supervision     Balance Overall balance assessment: Needs assistance Sitting-balance support: Feet supported Sitting balance-Leahy Scale: Good     Standing balance support: Reliant on assistive device for balance, Single extremity supported Standing balance-Leahy Scale: Fair Standing balance comment: Reliant on Rw for support                           ADL either performed or assessed with clinical judgement   ADL Overall ADL's : Needs assistance/impaired Eating/Feeding: Sitting;Modified independent                       Toilet Transfer: Rolling walker (2 wheels);Regular Toilet;Grab bars;Contact guard assist   Toileting- Clothing Manipulation and Hygiene: Maximal assistance;Sit to/from stand Toileting - Clothing Manipulation Details (indicate cue type and reason): in standing with support on RW     Functional mobility during ADLs: Contact guard assist;Rolling walker (2 wheels) General ADL Comments: Pt amb BR with CGA + RW, slow but steady gait throughout. 3L oxygen Spo2 levels 90s    Extremity/Trunk Assessment              Vision       Perception     Praxis     Communication Communication Communication: No apparent difficulties   Cognition Arousal: Alert Behavior During Therapy: WFL for tasks assessed/performed Cognition: No apparent impairments             OT - Cognition Comments: A/Ox4  Following commands: Intact Following commands impaired: Follows one step commands with increased time      Cueing   Cueing Techniques: Verbal cues  Exercises Exercises: General Lower Extremity, Other exercises Other Exercises Other Exercises: Edu: Safe ADL completion with RW    Shoulder Instructions       General Comments On arrival to room pt bed soiled from ostomy drainage, RN reported awaiting staff to assist pt with ostomy.     Pertinent Vitals/ Pain       Pain Assessment Pain Assessment: No/denies pain  Home Living                                          Prior Functioning/Environment              Frequency  Min 3X/week        Progress Toward Goals  OT Goals(current goals can now be found in the care plan section)  Progress towards OT goals: Progressing toward goals  Acute Rehab OT Goals Patient Stated Goal: return home OT Goal Formulation: With patient/family Time For Goal Achievement: 08/08/23 Potential to Achieve Goals: Good ADL Goals Pt Will Perform Lower Body Bathing: with supervision;sit to/from stand;sitting/lateral leans;with contact guard assist Pt Will Perform Lower Body Dressing: with contact guard assist;with min assist;sit to/from stand;sitting/lateral leans Pt Will Transfer to Toilet: with supervision;regular height toilet;ambulating;bedside commode Pt Will Perform Toileting - Clothing Manipulation and hygiene: with contact guard assist;sitting/lateral leans;sit to/from stand  Plan      Co-evaluation                 AM-PAC OT "6 Clicks" Daily Activity     Outcome Measure   Help from another person eating meals?: None Help from another person taking care of personal grooming?: A Little Help from another person toileting, which includes using toliet, bedpan, or urinal?: A Lot Help from another person bathing (including washing, rinsing, drying)?: A Lot Help from another person to put on and taking off regular upper body clothing?: A Little Help from another person to put on and taking off regular lower body clothing?: A Lot 6 Click Score: 16    End of Session Equipment Utilized During Treatment: Oxygen;Rolling walker (2 wheels)  OT Visit Diagnosis: Other abnormalities of gait and mobility (R26.89);Muscle weakness (generalized) (M62.81);Pain   Activity Tolerance Patient tolerated treatment well   Patient Left with call bell/phone within  reach;in chair;with chair alarm set;with nursing/sitter in room   Nurse Communication Mobility status        Time: 1610-9604 OT Time Calculation (min): 24 min  Charges: OT General Charges $OT Visit: 1 Visit OT Treatments $Self Care/Home Management : 23-37 mins  Glenard Haring M.S. OTR/L  08/03/23, 4:28 PM

## 2023-08-04 DIAGNOSIS — R6521 Severe sepsis with septic shock: Secondary | ICD-10-CM | POA: Diagnosis not present

## 2023-08-04 DIAGNOSIS — A419 Sepsis, unspecified organism: Secondary | ICD-10-CM | POA: Diagnosis not present

## 2023-08-04 LAB — MAGNESIUM: Magnesium: 2 mg/dL (ref 1.7–2.4)

## 2023-08-04 LAB — BASIC METABOLIC PANEL WITH GFR
Anion gap: 7 (ref 5–15)
BUN: 21 mg/dL — ABNORMAL HIGH (ref 6–20)
CO2: 27 mmol/L (ref 22–32)
Calcium: 8.9 mg/dL (ref 8.9–10.3)
Chloride: 96 mmol/L — ABNORMAL LOW (ref 98–111)
Creatinine, Ser: 0.9 mg/dL (ref 0.61–1.24)
GFR, Estimated: >60 mL/min (ref >60)
Glucose, Bld: 93 mg/dL (ref 70–99)
Potassium: 3.2 mmol/L — ABNORMAL LOW (ref 3.5–5.1)
Sodium: 130 mmol/L — ABNORMAL LOW (ref 135–145)

## 2023-08-04 MED ORDER — POTASSIUM CHLORIDE CRYS ER 20 MEQ PO TBCR
40.0000 meq | EXTENDED_RELEASE_TABLET | Freq: Once | ORAL | Status: AC
  Administered 2023-08-04: 40 meq via ORAL
  Filled 2023-08-04: qty 2

## 2023-08-04 NOTE — Plan of Care (Signed)
  Problem: Education: Goal: Knowledge of General Education information will improve Description: Including pain rating scale, medication(s)/side effects and non-pharmacologic comfort measures Outcome: Progressing   Problem: Nutrition: Goal: Adequate nutrition will be maintained Outcome: Progressing   Problem: Activity: Goal: Risk for activity intolerance will decrease Outcome: Progressing   Problem: Elimination: Goal: Will not experience complications related to bowel motility Outcome: Progressing

## 2023-08-04 NOTE — Progress Notes (Signed)
 Physical Therapy Treatment Patient Details Name: [name] MRN: [id number] DOB: 1965-11-21 Today's Date: 08/04/2023   History of Present Illness Patient is a 58 year old male here for elective  inguinal hernia repair. Had bleeding  required electrocautery and ligature for hemostasis. After getting blood products, patient deteriorated, treated for shock and hypoxemia. Had rapid response with small bowel evisceration secondary to fascial dehiscence requiring surgery for evisercation and retention suture placement. Requiring pressor support.     Equipment Recommendations  Hospital bed;BSC/3in1;Rolling walker (2 wheels)       Precautions / Restrictions Precautions Precautions: Fall Recall of Precautions/Restrictions: Intact Precaution/Restrictions Comments: abdominal binder- stitches removed Restrictions Weight Bearing Restrictions Per Provider Order: No     Mobility  Bed Mobility Overal bed mobility: Needs Assistance Bed Mobility: Supine to Sit Rolling: Supervision, Used rails Supine to sit: Supervision, HOB elevated, Used rails  General bed mobility comments: heavy use of bedrail + increased time    Transfers Overall transfer level: Needs assistance Equipment used: Rolling walker (2 wheels) Transfers: Sit to/from Stand Sit to Stand: Supervision   Ambulation/Gait Ambulation/Gait assistance: Supervision, Contact guard assist Gait Distance (Feet): 100 Feet Assistive device: Rolling walker (2 wheels) Gait Pattern/deviations: Step-through pattern Gait velocity: decreased General Gait Details: Pt tolerated increased gait distance this date. Pt tolerated ambulation on room air with sao2 >94%. RN made aware that pt was able to maintain on rm air. daughter did provide chair follow during ambulation in hallway     Balance Overall balance assessment: Needs assistance Sitting-balance support: Feet supported Sitting balance-Leahy Scale: Good     Standing balance support:  Bilateral upper extremity supported, During functional activity, Reliant on assistive device for balance Standing balance-Leahy Scale: Good Standing balance comment: Reliant on Rw for support       Communication Communication Communication: No apparent difficulties  Cognition Arousal: Alert Behavior During Therapy: WFL for tasks assessed/performed   PT - Cognitive impairments: No apparent impairments    PT - Cognition Comments: Pt is A and O x 4. pleasant and cooprative throughout. Somewhat flat affect/slower processing Following commands: Intact      Cueing Cueing Techniques: Verbal cues         Pertinent Vitals/Pain Pain Assessment Pain Assessment: 0-10 Pain Score: 2  Pain Location: back Pain Descriptors / Indicators: Aching     PT Goals (current goals can now be found in the care plan section) Acute Rehab PT Goals Patient Stated Goal: get stronger Progress towards PT goals: Progressing toward goals    Frequency    Min 3X/week           Co-evaluation     PT goals addressed during session: Mobility/safety with mobility;Balance;Proper use of DME;Strengthening/ROM        AM-PAC PT "6 Clicks" Mobility   Outcome Measure  Help needed turning from your back to your side while in a flat bed without using bedrails?: A Little Help needed moving from lying on your back to sitting on the side of a flat bed without using bedrails?: A Little Help needed moving to and from a bed to a chair (including a wheelchair)?: A Little Help needed standing up from a chair using your arms (e.g., wheelchair or bedside chair)?: A Little Help needed to walk in hospital room?: A Little Help needed climbing 3-5 steps with a railing? : A Little 6 Click Score: 18    End of Session   Activity Tolerance: Patient tolerated treatment well;Patient limited by fatigue Patient left: in  chair;with call bell/phone within reach;with family/visitor present Nurse Communication: Mobility  status PT Visit Diagnosis: Unsteadiness on feet (R26.81);Difficulty in walking, not elsewhere classified (R26.2);Muscle weakness (generalized) (M62.81)     Time: 2130-8657 PT Time Calculation (min) (ACUTE ONLY): 21 min  Charges:    $Gait Training: 8-22 mins PT General Charges $$ ACUTE PT VISIT: 1 Visit                     Jetta Lout PTA 08/04/23, 4:15 PM

## 2023-08-04 NOTE — Progress Notes (Signed)
 Subjective:  CC: [name] is a 58 y.o. male  Hospital stay day 22, 18 Days Post-Op evisercation and retention suture placement  HPI: Request from hospitalist service to eval staple and retention suture for possible removal prior to d/c.  Pt has no complaints around incision.  ROS:  General: Denies weight loss, weight gain, fatigue, fevers, chills, and night sweats. Heart: Denies chest pain, palpitations, racing heart, irregular heartbeat, leg pain or swelling, and decreased activity tolerance. Respiratory: Denies breathing difficulty, shortness of breath, wheezing, cough, and sputum. GI: Denies change in appetite, heartburn, nausea, vomiting, constipation, diarrhea, and blood in stool. GU: Denies difficulty urinating, pain with urinating, urgency, frequency, blood in urine.   Objective:   Temp:  [97.9 F (36.6 C)-98.3 F (36.8 C)] 97.9 F (36.6 C) (04/09 0740) Pulse Rate:  [84-98] 87 (04/09 0740) Resp:  [14-18] 18 (04/09 0740) BP: (110-125)/(65-71) 122/66 (04/09 0740) SpO2:  [93 %-96 %] 96 % (04/09 0740) Weight:  [152.9 kg] 152.9 kg (04/09 0500)     Height: 6\' 2"  (188 cm) Weight: (!) 152.9 kg BMI (Calculated): 43.25   Intake/Output this shift:   Intake/Output Summary (Last 24 hours) at 08/04/2023 1116 Last data filed at 08/04/2023 0900 Gross per 24 hour  Intake 1030 ml  Output 1350 ml  Net -320 ml    Constitutional :  alert, cooperative, appears stated age, and mild distress  Respiratory:  clear to auscultation bilaterally  Cardiovascular:  Tachy rate  Gastrointestinal: soft, non-tender; bowel sounds normal; no masses,  no organomegaly. Staples and retention suture in place.  Skin with scabs but healed well overall. No swelling, erythema, induration, drainage to indicate infection.  Skin: Cool and moist. Retention sutures intact  Psychiatric: Normal affect, non-agitated, not confused       LABS:     Latest Ref Rng & Units 08/04/2023    4:43 AM 08/03/2023    4:42 AM  08/02/2023    5:09 AM  CMP  Glucose 70 - 99 mg/dL 93  696  295   BUN 6 - 20 mg/dL 21  19  18    Creatinine 0.61 - 1.24 mg/dL 2.84  1.32  4.40   Sodium 135 - 145 mmol/L 130  131  134   Potassium 3.5 - 5.1 mmol/L 3.2  3.2  3.5   Chloride 98 - 111 mmol/L 96  96  94   CO2 22 - 32 mmol/L 27  28  32   Calcium 8.9 - 10.3 mg/dL 8.9  9.0  9.0       Latest Ref Rng & Units 08/03/2023    4:42 AM 07/29/2023    6:21 AM 07/28/2023    5:36 AM  CBC  WBC 4.0 - 10.5 K/uL 9.4  7.9  9.8   Hemoglobin 13.0 - 17.0 g/dL 10.2  72.5  36.6   Hematocrit 39.0 - 52.0 % 34.9  32.3  34.6   Platelets 150 - 400 K/uL 275  172  195     RADS: CLINICAL DATA:  Ileus.   EXAM: PORTABLE ABDOMEN - 1 VIEW   COMPARISON:  July 17, 2023.   FINDINGS: Stable small bowel dilatation is noted concerning for ileus or distal small bowel obstruction. No colonic dilatation is noted. Midline surgical staples are noted. Surgical drain is noted in the epigastric region.   IMPRESSION: Small bowel dilatation is noted concerning for postoperative ileus or distal small bowel obstruction. Surgical drain is noted in epigastric region.     Electronically Signed  By: Lupita Raider M.D.   On: 07/22/2023 11:46   Assessment:   S/p exlap, subsequent evisceration, retention suture placement.  Retention suture and staples all removed at bedside.  1cm opening x2 noted at staple area slight below middle of incision, small hematoma drainage.  Gently probing noted depth of less than 5mm, no additional pockets.  Scant bloody discharge from most inferior retention suture site.  Area covered with mepilex.  Continue daily mepilex changes to area until healed. Ok to shower.  Continue lifting restrictions of nothing greater than 10lbs, no push/pull greater than 30lbs for additional 4wks.  F/u prn with our office.  If he still wishes to pursue inguinal hernia repair, recommend be seen at tertiary care center due to multiple issues post op  here.   labs/images/medications/previous chart entries reviewed personally and relevant changes/updates noted above.

## 2023-08-04 NOTE — Progress Notes (Addendum)
 Progress Note    [name]  BJY:[id number] DOB: 15-Oct-1965  DOA: 07/13/2023 PCP: Ailene Ravel, MD      Brief Narrative:    Medical records reviewed and are as summarized below:  [name] is a 58 y.o. male medical history significant for obesity , OSA, alcohol use, hx of admission to hospital for ETOH withdrawal. He came in for elective surgery to have inguinal hernia repair with lysis of adhesions. He had significant bleeding per surgeon which was unusual and required electrocautery and ligature for hemostasis. He was brought back with abdominal JP drain and had FFP and platelets ordered due to intraoperative bleeding. After receiving these blood products patient deteriorated and developed circulatory shock, hypoxemia, swelling worse on lower extremities. He had code RRT called and was emergently brought to ICU due to shock with hypoxemia. He was treated for anaphylactic non febrile blood product reaction vs TRALI and improved overnight. He became stable and was downgraded to medical floor.    3/18 patient had bilateral inguinal hernia repair plus lysis of adhesions and exploratory laparotomy. 3/22.  Patient was brought to the operating room by Dr. Aleen Campi for fascial dehiscence with small bowel evisceration and handout exploratory laparotomy with abdominal washout and abdominal closure with retention sutures.   07/17/23- patient had code RRT overnight, he had findings of small bowel evisceration secondary to fascial dehiscence.  This likely stemmed from forceful cough per surgery service.  He became unstable in distress and required endotracheal intubation.  He was taken to OR emergently for ex-lap and closure of abdominal cavity.  Daughter was available to come in and we reviewed medical plan. CVl placed   07/18/23- extubated but reports severe pain. Daughter at bedside and patient is improved able to speak in full sentences.  Reviewed case with surgery and placed abdominal  binder.  He is on CIWA due to reported daily drinking.    3/24 lethargic but arousable, remains  on pressors   3/25 remains on pressors, foley in place.  Central line removed and PICC line placed   3/26.  Medical team took over.  Foley catheter removed.  Patient spiked a fever of 101.  Blood cultures, chest x-ray, urine analysis and urine culture (after Foley removed).  Patient already on fluconazole and meropenem. 3/27.  Since MRSA PCR came back negative vancomycin was discontinued.  NG tube came out and needed to be replaced. 3/28.  Patient started on clear liquid diet.  Still has NG tube. 3/29.  Will continue IV Lasix on a daily basis with weight gain up to 395 pounds.  Patient states that he normally is around 350. 3/30.  Right arm swelling.  Ultrasound showed no DVT.  PICC line removed.  TPN stopped.  Started Lovenox injections. 3/31.  CT scan of the abdomen pelvis shows postoperative ileus.  CT scan of the chest shows pneumonia.  Still on meropenem.  Nystatin powder under skin folds.  Placed on solid food by general surgery 4/1.  Patient did have a poor night of sleep and is sleepy today.  White blood cell count down to 12.6 and procalcitonin down to 0.35.  Will discontinue meropenem today and monitor.  Change Lovenox injections over to Eliquis for this evening. 4/4: Patient overall diuresing well.  Sodium improving. 4/6: Continues to diurese well.  Kidney function stable.  Sodium bicarbonate increasing 4/8: Continues to diurese well.  Kidney function stable.  Sodium starting to decline.    Assessment/Plan:  Principal Problem:   Septic shock (HCC) Active Problems:   Fever   Perioperative dehiscence of abdominal wound with evisceration   Arm DVT (deep venous thromboembolism), acute, right (HCC)   Ventral hernia without obstruction or gangrene   Inguinal hernia   Anaphylactic reaction   TRALI (transfusion related acute lung injury)   Thrombocytopenia (HCC)   Acute respiratory  failure with hypoxia and hypercapnia (HCC)   History of alcohol abuse   AKI (acute kidney injury) (HCC)   Morbid obesity with BMI of 50.0-59.9, adult (HCC)   Weight gain   Hyperkalemia   Hyponatremia   Swelling of arm   Skin rash   Nutrition Problem: Inadequate oral intake Etiology: altered GI function  Signs/Symptoms: NPO status   Body mass index is 43.27 kg/m.   Anasarca/fluid overload Hyponatremia, multifactorial Contraction alkalosis He had significant peripheral edema and was receiving IV Lasix. Metolazone and albumin were added for adequate diuresis He developed contraction alkalosis but this has improved Discontinue IV Lasix and his anasarca has improved significantly Net fluid loss on 08/04/2023 was 27,281 ml    Septic shock (HCC), resolved Secondary to abdominal wound dehiscence.   Patient completed meropenem on 4/1 and completed Diflucan on 3/29.      Fever, resolved On 3/26. Chest x-ray showing multifocal pneumonia.  Already on meropenem.  Central line removed 3/25 and PICC line placed 3/25.  Foley catheter removed 3/25.  Blood cultures are negative for 5 days.  White blood cell count trended down 12.6.  Procalcitonin 0.35.  Meropenem stopped.  Continue monitoring vitals and fever curve    Perioperative dehiscence of abdominal wound with evisceration Completed antibiotics.  TPN disc continued and PICC line was removed.  Patient had NG tube removed on 3/29.  On clear liquid diet on 3/30.  General surgery advance diet to solid food on 3/31.   He was reevaluated by Dr. Tonna Boehringer, general surgeon on 08/04/2023.   Surgical wound staples have been removed.   Arm DVT (deep venous thromboembolism), acute, right (HCC) Right arm PICC line removed on 3/30.   Initially started on Lovenox.   Continue Eliquis    Skin rash Patient has some sloughing of the skin with erythematous changes underneath skin folds, groin and in the armpit.   Improved with nystatin powder      Hyperkalemia Improved with 1 dose of Lokelma    Weight gain Patient states his normal rate is around 350 pounds.   Patient was as high as 395.95 pounds.  Weight down to 337 pounds after diuresis    Morbid obesity with BMI of 50.0-59.9, adult (HCC) BMI down to 43.27 but this is still in the morbid obesity range.  Weight loss advised.    AKI (acute kidney injury) (HCC) Resolved.  Creatinine 1.29 on 3/19.  Creatinine went as low to 0.7.      History of alcohol use disorder Continue iron and folic acid    Acute respiratory failure with hypoxia and hypercapnia (HCC) He was 3 L/min oxygen.  He was successfully weaned off oxygen on 08/04/2023.  Oxygen saturation was 94 to 95% on room air with ambulation.    Thrombocytopenia (HCC) Resolved    TRALI (transfusion related acute lung injury) During the hospital course.    Inguinal hernia Initial surgery.    Hypokalemia Replete potassium and monitor levels   Hyponatremia Chronic and stable   Diet Order             Diet regular Room service appropriate?  Yes; Fluid consistency: Thin; Fluid restriction: 1500 mL Fluid  Diet effective now                            Consultants: General surgeon Intensivist  Procedures: Exploratory laparotomy, abdominal washout, abdominal closure with tension sutures on 07/17/2023 Diagnostic laparoscopy, lysis of adhesions, converted to ex lap on 07/13/2023 Intubated on 07/17/2023, extubated on 07/18/2023 Right IJ central line on 07/13/2023 Right IJ central line on 07/17/2023    Medications:    apixaban  5 mg Oral BID   vitamin C  500 mg Oral BID   chlorpheniramine-HYDROcodone  10 mL Oral TID   feeding supplement  237 mL Oral TID BM   folic acid  1 mg Oral Daily   furosemide  40 mg Intravenous BID   multivitamin with minerals  1 tablet Oral Daily   nystatin   Topical TID   pantoprazole  40 mg Oral QHS   thiamine  100 mg Oral Daily   Or   thiamine  100 mg Intravenous  Daily   Continuous Infusions:   Anti-infectives (From admission, onward)    Start     Dose/Rate Route Frequency Ordered Stop   07/25/23 1700  meropenem (MERREM) 1 g in sodium chloride 0.9 % 100 mL IVPB  Status:  Discontinued        1 g 200 mL/hr over 30 Minutes Intravenous Every 8 hours 07/25/23 1440 07/27/23 1235   07/22/23 0800  vancomycin (VANCOREADY) IVPB 2000 mg/400 mL  Status:  Discontinued        2,000 mg 200 mL/hr over 120 Minutes Intravenous Every 12 hours 07/21/23 1856 07/22/23 1136   07/21/23 2145  vancomycin (VANCOCIN) IVPB 1000 mg/200 mL premix       Placed in "Followed by" Linked Group   1,000 mg 200 mL/hr over 60 Minutes Intravenous  Once 07/21/23 1845 07/21/23 2323   07/21/23 1930  vancomycin (VANCOREADY) IVPB 1500 mg/300 mL       Placed in "Followed by" Linked Group   1,500 mg 150 mL/hr over 120 Minutes Intravenous  Once 07/21/23 1845 07/21/23 2201   07/21/23 1200  fluconazole (DIFLUCAN) IVPB 400 mg       Placed in "Followed by" Linked Group   400 mg 100 mL/hr over 120 Minutes Intravenous Every 24 hours 07/20/23 1059 07/24/23 1452   07/20/23 1400  meropenem (MERREM) 1 g in sodium chloride 0.9 % 100 mL IVPB  Status:  Discontinued        1 g 200 mL/hr over 30 Minutes Intravenous Every 8 hours 07/20/23 1021 07/25/23 1440   07/20/23 1145  fluconazole (DIFLUCAN) IVPB 800 mg       Placed in "Followed by" Linked Group   800 mg 200 mL/hr over 120 Minutes Intravenous  Once 07/20/23 1059 07/20/23 1428   07/17/23 1400  piperacillin-tazobactam (ZOSYN) IVPB 3.375 g  Status:  Discontinued        3.375 g 12.5 mL/hr over 240 Minutes Intravenous Every 8 hours 07/17/23 0753 07/20/23 1021   07/17/23 0600  piperacillin-tazobactam (ZOSYN) IVPB 3.375 g        3.375 g 100 mL/hr over 30 Minutes Intravenous On call to O.R. 07/17/23 0349 07/17/23 0503   07/15/23 1045  amoxicillin-clavulanate (AUGMENTIN) 875-125 MG per tablet 1 tablet  Status:  Discontinued        1 tablet Oral Every 12  hours 07/15/23 0955 07/17/23 0753   07/13/23 1830  piperacillin-tazobactam (  ZOSYN) IVPB 3.375 g  Status:  Discontinued        3.375 g 12.5 mL/hr over 240 Minutes Intravenous Every 8 hours 07/13/23 1743 07/15/23 0955   07/13/23 1815  cefTRIAXone (ROCEPHIN) 1 g in sodium chloride 0.9 % 100 mL IVPB  Status:  Discontinued        1 g 200 mL/hr over 30 Minutes Intravenous Every 24 hours 07/13/23 1728 07/13/23 1729   07/13/23 0715  ceFAZolin (ANCEF) IVPB 3g/150 mL premix        3 g 300 mL/hr over 30 Minutes Intravenous On call to O.R. 07/13/23 0700 07/13/23 0843              Family Communication/Anticipated D/C date and plan/Code Status   DVT prophylaxis: SCDs Start: 07/13/23 1950 apixaban (ELIQUIS) tablet 5 mg     Code Status: Full Code  Family Communication: Plan discussed with Alcario Drought, daughter, at the bedside Disposition Plan: Plan to discharge home   Status is: Inpatient Remains inpatient appropriate because: Abdominal surgery, fluid overload, respiratory failure       Subjective:   Interval events noted.  He did not have a good sleep last night.  However, he feels better today.  Alcario Drought, daughter, was at the bedside  Objective:    Vitals:   08/03/23 2030 08/04/23 0441 08/04/23 0500 08/04/23 0740  BP: 125/71 123/65  122/66  Pulse: 98 84  87  Resp: 16 16  18   Temp: 98.1 F (36.7 C) 98.3 F (36.8 C)  97.9 F (36.6 C)  TempSrc: Oral Oral    SpO2: 95% 93%  96%  Weight:   (!) 152.9 kg   Height:       No data found.   Intake/Output Summary (Last 24 hours) at 08/04/2023 1110 Last data filed at 08/04/2023 0900 Gross per 24 hour  Intake 1030 ml  Output 1350 ml  Net -320 ml   Filed Weights   08/03/23 0032 08/03/23 0500 08/04/23 0500  Weight: (!) 155.7 kg (!) 152.9 kg (!) 152.9 kg    Exam:  GEN: NAD SKIN: Patchy erythematous rash in bilateral groin and hips EYES: No pallor or icterus ENT: MMM CV: RRR PULM: CTA B ABD: soft, obese, NT, +BS, surgical  incisional wound clean, dry and intact with staples.  No drainage noted from the wound. CNS: AAO x 3, non focal EXT: Trace bilateral leg edema, no erythema or tenderness        Data Reviewed:   I have personally reviewed following labs and imaging studies:  Labs: Labs show the following:   Basic Metabolic Panel: Recent Labs  Lab 07/31/23 0431 08/01/23 0539 08/02/23 0509 08/03/23 0442 08/04/23 0443  NA 132* 133* 134* 131* 130*  K 3.3* 3.0* 3.5 3.2* 3.2*  CL 90* 89* 94* 96* 96*  CO2 34* 35* 32 28 27  GLUCOSE 115* 112* 118* 113* 93  BUN 18 16 18 19  21*  CREATININE 0.76 0.92 0.88 0.89 0.90  CALCIUM 8.6* 8.7* 9.0 9.0 8.9  MG 1.9 2.0 2.1 1.9 2.0   GFR Estimated Creatinine Clearance: 144.9 mL/min (by C-G formula based on SCr of 0.9 mg/dL). Liver Function Tests: No results for input(s): "AST", "ALT", "ALKPHOS", "BILITOT", "PROT", "ALBUMIN" in the last 168 hours. No results for input(s): "LIPASE", "AMYLASE" in the last 168 hours. No results for input(s): "AMMONIA" in the last 168 hours. Coagulation profile No results for input(s): "INR", "PROTIME" in the last 168 hours.  CBC: Recent Labs  Lab 07/29/23 [phone]  08/03/23 0442  WBC 7.9 9.4  NEUTROABS 4.5  --   HGB 11.1* 12.2*  HCT 32.3* 34.9*  MCV 100.6* 97.5  PLT 172 275   Cardiac Enzymes: No results for input(s): "CKTOTAL", "CKMB", "CKMBINDEX", "TROPONINI" in the last 168 hours. BNP (last 3 results) No results for input(s): "PROBNP" in the last 8760 hours. CBG: Recent Labs  Lab 07/29/23 0549 07/29/23 1150 07/29/23 1757 07/29/23 2314 07/30/23 0521  GLUCAP 105* 111* 111* 91 85   D-Dimer: No results for input(s): "DDIMER" in the last 72 hours. Hgb A1c: No results for input(s): "HGBA1C" in the last 72 hours. Lipid Profile: No results for input(s): "CHOL", "HDL", "LDLCALC", "TRIG", "CHOLHDL", "LDLDIRECT" in the last 72 hours. Thyroid function studies: No results for input(s): "TSH", "T4TOTAL", "T3FREE",  "THYROIDAB" in the last 72 hours.  Invalid input(s): "FREET3" Anemia work up: No results for input(s): "VITAMINB12", "FOLATE", "FERRITIN", "TIBC", "IRON", "RETICCTPCT" in the last 72 hours. Sepsis Labs: Recent Labs  Lab 07/29/23 0621 08/03/23 0442  WBC 7.9 9.4    Microbiology No results found for this or any previous visit (from the past 240 hours).  Procedures and diagnostic studies:  No results found.             LOS: 22 days   Helix Lafontaine  [employer] loss adjuster on [email or web address]. If 7PM-7AM, please contact night-coverage at [email or web address]     08/04/2023, 11:10 AM

## 2023-08-05 DIAGNOSIS — R6521 Severe sepsis with septic shock: Secondary | ICD-10-CM | POA: Diagnosis not present

## 2023-08-05 DIAGNOSIS — A419 Sepsis, unspecified organism: Secondary | ICD-10-CM | POA: Diagnosis not present

## 2023-08-05 LAB — BASIC METABOLIC PANEL WITH GFR
Anion gap: 9 (ref 5–15)
BUN: 20 mg/dL (ref 6–20)
CO2: 28 mmol/L (ref 22–32)
Calcium: 9 mg/dL (ref 8.9–10.3)
Chloride: 96 mmol/L — ABNORMAL LOW (ref 98–111)
Creatinine, Ser: 0.82 mg/dL (ref 0.61–1.24)
GFR, Estimated: >60 mL/min (ref >60)
Glucose, Bld: 101 mg/dL — ABNORMAL HIGH (ref 70–99)
Potassium: 3.2 mmol/L — ABNORMAL LOW (ref 3.5–5.1)
Sodium: 133 mmol/L — ABNORMAL LOW (ref 135–145)

## 2023-08-05 LAB — MAGNESIUM: Magnesium: 2 mg/dL (ref 1.7–2.4)

## 2023-08-05 MED ORDER — POTASSIUM CHLORIDE CRYS ER 20 MEQ PO TBCR
40.0000 meq | EXTENDED_RELEASE_TABLET | Freq: Once | ORAL | Status: AC
  Administered 2023-08-05: 40 meq via ORAL
  Filled 2023-08-05: qty 2

## 2023-08-05 MED ORDER — ACETAMINOPHEN 325 MG PO TABS: 650.0000 mg | ORAL_TABLET | Freq: Four times a day (QID) | ORAL | Status: DC | PRN

## 2023-08-05 MED ORDER — POTASSIUM CHLORIDE CRYS ER 10 MEQ PO TBCR: 10.0000 meq | EXTENDED_RELEASE_TABLET | Freq: Two times a day (BID) | ORAL | 0 refills | Status: DC

## 2023-08-05 MED ORDER — APIXABAN 5 MG PO TABS: 5.0000 mg | ORAL_TABLET | Freq: Two times a day (BID) | ORAL | 0 refills | Status: DC

## 2023-08-05 NOTE — Progress Notes (Signed)
 Patient discharged home via personal car with family.

## 2023-08-05 NOTE — Plan of Care (Signed)
  Problem: Education: Goal: Knowledge of General Education information will improve Description: Including pain rating scale, medication(s)/side effects and non-pharmacologic comfort measures Outcome: Progressing   Problem: Coping: Goal: Level of anxiety will decrease Outcome: Progressing   Problem: Elimination: Goal: Will not experience complications related to bowel motility Outcome: Progressing   

## 2023-08-05 NOTE — Discharge Summary (Signed)
 Physician Discharge Summary   Patient: [name] MRN: [id number] DOB: [birth year]  Admit date:     07/13/2023  Discharge date: 08/05/23  Discharge Physician: Lurene Shadow   PCP: Ailene Ravel, MD   Recommendations at discharge:  {Tip this will not be part of the note when signed- Example include specific recommendations for outpatient follow-up, pending tests to follow-up on. (Optional):26781}  Follow up with ***  Discharge Diagnoses: Principal Problem:   Septic shock (HCC) Active Problems:   Fever   Perioperative dehiscence of abdominal wound with evisceration   Arm DVT (deep venous thromboembolism), acute, right (HCC)   Ventral hernia without obstruction or gangrene   Inguinal hernia   Anaphylactic reaction   TRALI (transfusion related acute lung injury)   Thrombocytopenia (HCC)   Acute respiratory failure with hypoxia and hypercapnia (HCC)   History of alcohol abuse   AKI (acute kidney injury) (HCC)   Morbid obesity with BMI of 50.0-59.9, adult (HCC)   Weight gain   Hyperkalemia   Hyponatremia   Swelling of arm   Skin rash  Resolved Problems:   * No resolved hospital problems. *  Hospital Course: [name] is a 58 y.o. male medical history significant for obesity , OSA, alcohol use, hx of admission to hospital for ETOH withdrawal. He came in for elective surgery to have inguinal hernia repair with lysis of adhesions. He had significant bleeding per surgeon which was unusual and required electrocautery and ligature for hemostasis. He was brought back with abdominal JP drain and had FFP and platelets ordered due to intraoperative bleeding. After receiving these blood products patient deteriorated and developed circulatory shock, hypoxemia, swelling worse on lower extremities. He had code RRT called and was emergently brought to ICU due to shock with hypoxemia. He was treated for anaphylactic non febrile blood product reaction vs TRALI and improved overnight. He  became stable and was downgraded to medical floor.    3/18 patient had bilateral inguinal hernia repair plus lysis of adhesions and exploratory laparotomy. 3/22.  Patient was brought to the operating room by Dr. Aleen Campi for fascial dehiscence with small bowel evisceration and handout exploratory laparotomy with abdominal washout and abdominal closure with retention sutures.   07/17/23- patient had code RRT overnight, he had findings of small bowel evisceration secondary to fascial dehiscence.  This likely stemmed from forceful cough per surgery service.  He became unstable in distress and required endotracheal intubation.  He was taken to OR emergently for ex-lap and closure of abdominal cavity.  Daughter was available to come in and we reviewed medical plan. CVl placed   07/18/23- extubated but reports severe pain. Daughter at bedside and patient is improved able to speak in full sentences.  Reviewed case with surgery and placed abdominal binder.  He is on CIWA due to reported daily drinking.    3/24 lethargic but arousable, remains  on pressors   3/25 remains on pressors, foley in place.  Central line removed and PICC line placed   3/26.  Medical team took over.  Foley catheter removed.  Patient spiked a fever of 101.  Blood cultures, chest x-ray, urine analysis and urine culture (after Foley removed).  Patient already on fluconazole and meropenem. 3/27.  Since MRSA PCR came back negative vancomycin was discontinued.  NG tube came out and needed to be replaced. 3/28.  Patient started on clear liquid diet.  Still has NG tube. 3/29.  Will continue IV Lasix on a daily basis with  weight gain up to 395 pounds.  Patient states that he normally is around 350. 3/30.  Right arm swelling.  Ultrasound showed no DVT.  PICC line removed.  TPN stopped.  Started Lovenox injections. 3/31.  CT scan of the abdomen pelvis shows postoperative ileus.  CT scan of the chest shows pneumonia.  Still on meropenem.  Nystatin  powder under skin folds.  Placed on solid food by general surgery 4/1.  Patient did have a poor night of sleep and is sleepy today.  White blood cell count down to 12.6 and procalcitonin down to 0.35.  Will discontinue meropenem today and monitor.  Change Lovenox injections over to Eliquis for this evening. 4/4: Patient overall diuresing well.  Sodium improving. 4/6: Continues to diurese well.  Kidney function stable.  Sodium bicarbonate increasing 4/8: Continues to diurese well.  Kidney function stable.  Sodium starting to decline.   Assessment and Plan: Anasarca/fluid overload Hyponatremia, multifactorial Contraction alkalosis He had significant peripheral edema and was receiving IV Lasix. Metolazone and albumin were added for adequate diuresis He developed contraction alkalosis but this has improved Discontinue IV Lasix and his anasarca has improved significantly Net fluid loss on 08/04/2023 was 27,281 ml     Septic shock (HCC), resolved Secondary to abdominal wound dehiscence.   Patient completed meropenem on 4/1 and completed Diflucan on 3/29.       Fever, resolved On 3/26. Chest x-ray showing multifocal pneumonia.  Already on meropenem.  Central line removed 3/25 and PICC line placed 3/25.  Foley catheter removed 3/25.  Blood cultures are negative for 5 days.  White blood cell count trended down 12.6.  Procalcitonin 0.35.  Meropenem stopped.  Continue monitoring vitals and fever curve     Perioperative dehiscence of abdominal wound with evisceration Completed antibiotics.  TPN discontinued and PICC line was removed.  Patient had NG tube removed on 3/29.  On clear liquid diet on 3/30.  General surgery advance diet to solid food on 3/31.   He was reevaluated by Dr. Tonna Boehringer, general surgeon on 08/04/2023.   Surgical wound staples have been removed.     Arm DVT (deep venous thromboembolism), acute, right (HCC) Right arm PICC line removed on 3/30.   Initially started on Lovenox.    Continue Eliquis at discharge     Skin rash Patient has some sloughing of the skin with erythematous changes underneath skin folds, groin and in the armpit.   Improved with nystatin powder     Hyperkalemia Improved with 1 dose of Lokelma     Weight gain Patient states his normal rate is around 350 pounds.   Patient was as high as 395.95 pounds.  Weight down to 337 pounds after diuresis     Morbid obesity with BMI of 50.0-59.9, adult (HCC) BMI down to 43.27 but this is still in the morbid obesity range.  Weight loss advised.     AKI (acute kidney injury) (HCC) Resolved.  Creatinine 1.29 on 3/19.  Creatinine went as low to 0.7.       History of alcohol use disorder Counseled to quit     Acute respiratory failure with hypoxia and hypercapnia (HCC) Resolved     Thrombocytopenia (HCC) Resolved     TRALI (transfusion related acute lung injury) During the hospital course.     Inguinal hernia Surgical repair was aborted because of complications at the time of surgery     Hypokalemia Continue potassium repletion at discharge     Hyponatremia Chronic and  stable     His condition is improved and he is deemed stable for discharge to home today.  Discharge plan was discussed with Alcario Drought, daughter, at the bedside    {Tip this will not be part of the note when signed Body mass index is 43.58 kg/m. ,  Nutrition Documentation    Flowsheet Row Admission (Current) from 07/13/2023 in [facility]  Nutrition Problem Inadequate oral intake  Etiology altered GI function  Nutrition Goal Patient will meet greater than or equal to 90% of their needs     ,  (Optional):26781}   Consultants: *** Procedures performed: ***  Disposition: {Plan; Disposition:26390} Diet recommendation:  Discharge Diet Orders (From admission, onward)     Start     Ordered   08/05/23 0000  Diet - low sodium heart healthy        08/05/23 1037            Cardiac diet DISCHARGE MEDICATION: Allergies as of 08/05/2023       Reactions   Sulfa Antibiotics Anaphylaxis, Hives        Medication List     STOP taking these medications    oxyCODONE 5 MG immediate release tablet Commonly known as: Roxicodone       TAKE these medications    acetaminophen 325 MG tablet Commonly known as: TYLENOL Take 2 tablets (650 mg total) by mouth every 6 (six) hours as needed for mild pain (pain score 1-3), fever or headache.   apixaban 5 MG Tabs tablet Commonly known as: ELIQUIS Take 1 tablet (5 mg total) by mouth 2 (two) times daily.   potassium chloride 10 MEQ tablet Commonly known as: KLOR-CON M Take 1 tablet (10 mEq total) by mouth 2 (two) times daily.               Durable Medical Equipment  (From admission, onward)           Start     Ordered   08/05/23 1140  For home use only DME Walker rolling  Once       Comments: Bariatric  Question Answer Comment  Walker: With 5 Inch Wheels   Patient needs a walker to treat with the following condition Weakness      08/05/23 1139   08/05/23 1139  For home use only DME Bedside commode  Once       Comments: Bariatric  Question:  Patient needs a bedside commode to treat with the following condition  Answer:  Weakness   08/05/23 1139   08/05/23 1134  For home use only DME Hospital bed  Once       Question Answer Comment  Length of Need 6 Months   Patient has (list medical condition): OSA   The above medical condition requires: Patient requires the ability to reposition frequently   Head must be elevated greater than: 30 degrees   Bed type Semi-electric   Support Surface: Gel Overlay      08/05/23 1135   08/05/23 0000  For home use only DME Hospital bed       Question Answer Comment  Length of Need Lifetime   Head must be elevated greater than: 30 degrees   Bed type Semi-electric      08/05/23 1037   08/05/23 0000  DME Bedside commode       Question:  Patient needs a  bedside commode to treat with the following condition  Answer:  General weakness  08/05/23 1037   08/05/23 0000  For home use only DME Walker rolling       Question Answer Comment  Walker: Other   Comments 2 wheels   Patient needs a walker to treat with the following condition General weakness      08/05/23 1037   07/16/23 1602  For home use only DME Walker rolling  Once       Comments: Bariatric  Question Answer Comment  Walker: With 5 Inch Wheels   Patient needs a walker to treat with the following condition Weakness      07/16/23 1602   07/16/23 1602  For home use only DME Bedside commode  Once       Comments: Bariatric  Question:  Patient needs a bedside commode to treat with the following condition  Answer:  Weakness   07/16/23 1602            Follow-up Information     Tonna Boehringer, Isami, DO Follow up in 1 week(s).   Specialties: General Surgery, Surgery Why: post op drain removal August 11, 2023 @ 10:00am Contact information: [address] [place] [address] [phone]                Discharge Exam: Ceasar Mons Weights   08/04/23 0500 08/05/23 0014 08/05/23 0500  Weight: (!) 152.9 kg (!) 154 kg (!) 154 kg   GEN: NAD SKIN: Warm and dry EYES: No pallor or icterus ENT: MMM CV: RRR PULM: CTA B ABD: soft, obese, NT, +BS, surgical incision is intact.  Staples have been removed. CNS: AAO x 3, non focal EXT: No edema or tenderness   Condition at discharge: good  The results of significant diagnostics from this hospitalization (including imaging, microbiology, ancillary and laboratory) are listed below for reference.   Imaging Studies: CT ABDOMEN PELVIS W CONTRAST Result Date: 07/26/2023 CLINICAL DATA:  Pneumonia complications suspected. Postop abdominal pain. Recent exploratory laparotomy with abdominal washout. EXAM: CT CHEST, ABDOMEN, AND PELVIS WITH CONTRAST TECHNIQUE: Multidetector CT imaging of the chest, abdomen and pelvis was performed following the  standard protocol during bolus administration of intravenous contrast. RADIATION DOSE REDUCTION: This exam was performed according to the departmental dose-optimization program which includes automated exposure control, adjustment of the mA and/or kV according to patient size and/or use of iterative reconstruction technique. CONTRAST:  OMNIPAQUE IOHEXOL 300 MG/ML  SOLN COMPARISON:  Chest radiograph 07/21/2023 and 07/13/2023. Abdominal CT 07/05/2023. FINDINGS: CT CHEST FINDINGS Cardiovascular: Suboptimal contrast bolus. No acute vascular findings are identified. The heart size is normal. There is no pericardial effusion. Mediastinum/Nodes: Scattered prominent mediastinal lymph nodes, likely reactive. No axillary adenopathy. The thyroid gland, trachea and esophagus demonstrate no significant findings. Lungs/Pleura: New small dependent right pleural effusion. No significant left pleural effusion or pneumothorax. As shown on recent radiographs, there are extensive, partially confluent perihilar airspace opacities in both lungs, demonstrating a basilar predominance. These are new compared with the abdominal CT of 3 weeks prior and most consistent with multilobar pneumonia. No endobronchial lesion identified. Musculoskeletal/Chest wall: No chest wall mass or suspicious osseous findings. Mild spondylosis. Evidence of old healed sternal fracture. CT ABDOMEN AND PELVIS FINDINGS Hepatobiliary: Mild hepatic steatosis, improved from previous abdominal CT. No focal hepatic abnormalities are identified. No evidence of gallstones, gallbladder wall thickening or biliary dilatation. Pancreas: Unremarkable. No pancreatic ductal dilatation or surrounding inflammatory changes. Spleen: Normal in size without focal abnormality. Adrenals/Urinary Tract: Both adrenal glands appear normal. Suboptimal contrast bolus may account for limited renal enhancement  following contrast. No renal excretion identified on early postcontrast delayed  imaging. No evidence of urinary tract calculus, hydronephrosis or asymmetric perinephric soft tissue stranding. No focal renal abnormalities are identified. The bladder appears normal for its degree of distention. Stomach/Bowel: A small amount of enteric contrast has passed into the mid small bowel. The stomach appears unremarkable for its degree of distention. There is mild dilatation of the proximal small bowel loops with a gradual transition to normal caliber distal small bowel loops. No focal transition point or contrast extravasation identified. The colon is normal in caliber. The appendix is not clearly visualized. Vascular/Lymphatic: There are no enlarged abdominal or pelvic lymph nodes. Mild aortic and branch vessel atherosclerosis. No acute vascular findings are identified. There are numerous mesenteric and gonadal venous collaterals which are similar to the previous study. The portal, superior mesenteric and splenic veins are patent. Reproductive: The prostate gland and seminal vesicles appear unremarkable. Other: Postsurgical changes in the anterior abdominal wall with mild subcutaneous edema. There is an anterior surgical drain in place. No pneumoperitoneum, ascites or focal extraluminal fluid collection. Mildly prominent fat in both inguinal canals. No evidence of herniated bowel. Musculoskeletal: No acute or significant osseous findings. Mild lumbar spondylosis. IMPRESSION: 1. Extensive partially confluent perihilar airspace opacities in both lungs, most consistent with multilobar pneumonia. 2. New small dependent right pleural effusion. 3. Mild dilatation of the proximal small bowel loops with a gradual transition to normal caliber distal small bowel loops, most consistent with a postoperative ileus. 4. Postsurgical changes in the anterior abdominal wall with surgical drain in place. No evidence of pneumoperitoneum, ascites or focal extraluminal fluid collection. 5. Mild hepatic steatosis, improved  from previous abdominal CT. 6. Numerous mesenteric and gonadal venous collaterals, similar to previous study. 7.  Aortic Atherosclerosis (ICD10-I70.0). Electronically Signed   By: Carey Bullocks M.D.   On: 07/26/2023 15:05   CT CHEST W CONTRAST Result Date: 07/26/2023 CLINICAL DATA:  Pneumonia complications suspected. Postop abdominal pain. Recent exploratory laparotomy with abdominal washout. EXAM: CT CHEST, ABDOMEN, AND PELVIS WITH CONTRAST TECHNIQUE: Multidetector CT imaging of the chest, abdomen and pelvis was performed following the standard protocol during bolus administration of intravenous contrast. RADIATION DOSE REDUCTION: This exam was performed according to the departmental dose-optimization program which includes automated exposure control, adjustment of the mA and/or kV according to patient size and/or use of iterative reconstruction technique. CONTRAST:  OMNIPAQUE IOHEXOL 300 MG/ML  SOLN COMPARISON:  Chest radiograph 07/21/2023 and 07/13/2023. Abdominal CT 07/05/2023. FINDINGS: CT CHEST FINDINGS Cardiovascular: Suboptimal contrast bolus. No acute vascular findings are identified. The heart size is normal. There is no pericardial effusion. Mediastinum/Nodes: Scattered prominent mediastinal lymph nodes, likely reactive. No axillary adenopathy. The thyroid gland, trachea and esophagus demonstrate no significant findings. Lungs/Pleura: New small dependent right pleural effusion. No significant left pleural effusion or pneumothorax. As shown on recent radiographs, there are extensive, partially confluent perihilar airspace opacities in both lungs, demonstrating a basilar predominance. These are new compared with the abdominal CT of 3 weeks prior and most consistent with multilobar pneumonia. No endobronchial lesion identified. Musculoskeletal/Chest wall: No chest wall mass or suspicious osseous findings. Mild spondylosis. Evidence of old healed sternal fracture. CT ABDOMEN AND PELVIS FINDINGS  Hepatobiliary: Mild hepatic steatosis, improved from previous abdominal CT. No focal hepatic abnormalities are identified. No evidence of gallstones, gallbladder wall thickening or biliary dilatation. Pancreas: Unremarkable. No pancreatic ductal dilatation or surrounding inflammatory changes. Spleen: Normal in size without focal abnormality. Adrenals/Urinary Tract: Both adrenal glands  appear normal. Suboptimal contrast bolus may account for limited renal enhancement following contrast. No renal excretion identified on early postcontrast delayed imaging. No evidence of urinary tract calculus, hydronephrosis or asymmetric perinephric soft tissue stranding. No focal renal abnormalities are identified. The bladder appears normal for its degree of distention. Stomach/Bowel: A small amount of enteric contrast has passed into the mid small bowel. The stomach appears unremarkable for its degree of distention. There is mild dilatation of the proximal small bowel loops with a gradual transition to normal caliber distal small bowel loops. No focal transition point or contrast extravasation identified. The colon is normal in caliber. The appendix is not clearly visualized. Vascular/Lymphatic: There are no enlarged abdominal or pelvic lymph nodes. Mild aortic and branch vessel atherosclerosis. No acute vascular findings are identified. There are numerous mesenteric and gonadal venous collaterals which are similar to the previous study. The portal, superior mesenteric and splenic veins are patent. Reproductive: The prostate gland and seminal vesicles appear unremarkable. Other: Postsurgical changes in the anterior abdominal wall with mild subcutaneous edema. There is an anterior surgical drain in place. No pneumoperitoneum, ascites or focal extraluminal fluid collection. Mildly prominent fat in both inguinal canals. No evidence of herniated bowel. Musculoskeletal: No acute or significant osseous findings. Mild lumbar spondylosis.  IMPRESSION: 1. Extensive partially confluent perihilar airspace opacities in both lungs, most consistent with multilobar pneumonia. 2. New small dependent right pleural effusion. 3. Mild dilatation of the proximal small bowel loops with a gradual transition to normal caliber distal small bowel loops, most consistent with a postoperative ileus. 4. Postsurgical changes in the anterior abdominal wall with surgical drain in place. No evidence of pneumoperitoneum, ascites or focal extraluminal fluid collection. 5. Mild hepatic steatosis, improved from previous abdominal CT. 6. Numerous mesenteric and gonadal venous collaterals, similar to previous study. 7.  Aortic Atherosclerosis (ICD10-I70.0). Electronically Signed   By: Carey Bullocks M.D.   On: 07/26/2023 15:05   [place] EKG SITE RITE Result Date: 07/25/2023 If Site Rite image not attached, placement could not be confirmed due to current cardiac rhythm.  US Venous Img Upper Uni Right(DVT) Result Date: 07/25/2023 CLINICAL DATA:  Swelling, PICC line, recent surgeries EXAM: RIGHT UPPER EXTREMITY VENOUS DOPPLER ULTRASOUND TECHNIQUE: Gray-scale sonography with graded compression, as well as color Doppler and duplex ultrasound were performed to evaluate the upper extremity deep venous system from the level of the subclavian vein and including the jugular, axillary, basilic, radial, ulnar and upper cephalic vein. Spectral Doppler was utilized to evaluate flow at rest and with distal augmentation maneuvers. COMPARISON:  None Available. FINDINGS: Internal Jugular Vein: No evidence of thrombus. Normal compressibility, respiratory phasicity and response to augmentation. Subclavian Vein: PICC line is identified. There is occlusive thrombus surrounding the PICC line throughout the visualized subclavian vein. The vessel is noncompressible. Axillary Vein: PICC line is identified. There is occlusive thrombus surrounding the PICC line throughout the visualized axillary vein. The  vessel is noncompressible. Cephalic Vein: No evidence of thrombus. Normal compressibility, respiratory phasicity and response to augmentation. Basilic Vein: PICC line is identified. There is occlusive thrombus within the right basilic vein. Vessel is noncompressible. Brachial Veins: No evidence of thrombus. Normal compressibility, respiratory phasicity and response to augmentation. Portions of the mid brachial vein not well visualized due to overlying bandage. Radial Veins: No evidence of thrombus. Normal compressibility, respiratory phasicity and response to augmentation. Ulnar Veins: Not well visualized. Other Findings:  None visualized. IMPRESSION: 1. Occlusive DVT throughout the right basilic, axillary, and subclavian veins.  2. Indwelling PICC line. These results will be called to the ordering clinician or representative by the Radiologist Assistant, and communication documented in the PACS or [employer]. Electronically Signed   By: Sharlet Salina M.D.   On: 07/25/2023 16:32   DG ABD ACUTE 2+V W 1V CHEST Result Date: 07/25/2023 CLINICAL DATA:  [id number] Ileus following gastrointestinal surgery (HCC) [id number] EXAM: DG ABDOMEN ACUTE WITH 1 VIEW CHEST COMPARISON:  07/22/2023, 07/21/2023 FINDINGS: Enteric tube has been removed. Stable heart size. Extensive bilateral airspace opacities which may be slightly improved compared to the 07/21/2023 study. Surgical drain remains position within the upper abdomen. Multiple distended loops of large and small bowel throughout the abdomen appears slightly improving compared to the 07/22/2023 study. No gross free intraperitoneal air. Overlying surgical staples. IMPRESSION: 1. Extensive bilateral airspace opacities which may be slightly improved compared to the 07/21/2023 study. 2. Multiple distended loops of large and small bowel throughout the abdomen appears slightly improving compared to the 07/22/2023 study. Electronically Signed   By: Duanne Guess D.O.   On:  07/25/2023 10:21   DG Abd 1 View Result Date: 07/22/2023 CLINICAL DATA:  Feeding tube placement. EXAM: ABDOMEN - 1 VIEW COMPARISON:  July 22, 2023. FINDINGS: Distal tip of nasogastric tube is seen in expected position of proximal stomach. IMPRESSION: Distal tip of nasogastric tube seen in expected position of proximal stomach. Electronically Signed   By: Lupita Raider M.D.   On: 07/22/2023 11:47   DG Abd Portable 1V Result Date: 07/22/2023 CLINICAL DATA:  Ileus. EXAM: PORTABLE ABDOMEN - 1 VIEW COMPARISON:  July 17, 2023. FINDINGS: Stable small bowel dilatation is noted concerning for ileus or distal small bowel obstruction. No colonic dilatation is noted. Midline surgical staples are noted. Surgical drain is noted in the epigastric region. IMPRESSION: Small bowel dilatation is noted concerning for postoperative ileus or distal small bowel obstruction. Surgical drain is noted in epigastric region. Electronically Signed   By: Lupita Raider M.D.   On: 07/22/2023 11:46   DG Chest Port 1 View Result Date: 07/21/2023 CLINICAL DATA:  Fever EXAM: PORTABLE CHEST 1 VIEW COMPARISON:  07/17/2023 FINDINGS: Interval extubation. Enteric tube tip coursing towards diaphragm but distal aspect poorly visible. Removal of left IJ central venous catheter. Stable cardiomediastinal silhouette. Worsened widespread heterogeneous airspace opacities. Probable small right effusion. IMPRESSION: Interval extubation. Worsened widespread heterogeneous airspace opacities, either due to edema or diffuse pneumonia Electronically Signed   By: Jasmine Pang M.D.   On: 07/21/2023 18:32   [place] EKG SITE RITE Result Date: 07/20/2023 If Site Rite image not attached, placement could not be confirmed due to current cardiac rhythm.  DG Chest 1 View Result Date: 07/17/2023 CLINICAL DATA:  [id number], [id number]. Central venous catheter and endotracheal tube in place. EXAM: CHEST  1 VIEW COMPARISON:  Portable chest 07/13/2023 FINDINGS: 3:56 a.m. ETT  tip is 4.1 cm from the carina. Interval removal right IJ line. Interval insertion of left IJ central line with tip in the distal SVC. No pneumothorax. The heart is mildly enlarged. There is central vascular congestion, increased. There is interval development of interstitial edema in the lung bases and small pleural effusions. There is overlying hazy atelectasis or consolidation both lung bases. The mid and upper lung fields remain clear. The mediastinum is stable. Thoracic cage intact. IMPRESSION: 1. Interval development of interstitial edema in the lung bases and small pleural effusions. 2. Overlying hazy atelectasis or consolidation both lung bases. 3. Cardiomegaly with central vascular congestion. 4.  Support apparatus as above. Electronically Signed   By: Almira Bar M.D.   On: 07/17/2023 04:12   DG Abd 1 View Result Date: 07/17/2023 CLINICAL DATA:  [id number].  Vomiting status post ex lap 07/13/2023. EXAM: ABDOMEN - 1 VIEW COMPARISON:  CT with contrast 07/05/2023. FINDINGS: 12:49 a.m. There are overlying skin staples consistent with the interval laparotomy. There is dilatation of small bowel in the mid to lower abdomen more so on the left, maximum small bowel caliber 6.2 cm. Findings could be due to postoperative ileus or postoperative mid to distal small bowel obstruction. There is mild to moderate fecal stasis. Colonic aeration remains preserved through to the rectum. There is no supine evidence for free air. No radiopaque urinary stones. Lung bases are clear. IMPRESSION: 1. Dilatation of small bowel in the mid to lower abdomen more so on the left, maximum small bowel caliber 6.2 cm. Findings could be due to postoperative ileus or postoperative mid to distal small bowel obstruction. 2. Mild to moderate fecal stasis. Colonic aeration remains through to the rectum. 3. No supine evidence for free air. Electronically Signed   By: Almira Bar M.D.   On: 07/17/2023 01:09   ECHOCARDIOGRAM COMPLETE BUBBLE  STUDY Result Date: 07/14/2023    ECHOCARDIOGRAM REPORT   Patient Name:   HIRAN LEARD Date of Exam: 07/14/2023 Medical Rec #:  [id number]       Height:       74.0 in Accession #:    [id number]      Weight:       350.0 lb Date of Birth:  Sep 08, 1965        BSA:          2.757 m Patient Age:    57 years        BP:           118/64 mmHg Patient Gender: M               HR:           92 bpm. Exam Location:  [facility] Procedure: 2D Echo, Cardiac Doppler and Color Doppler (Both Spectral and Color            Flow Doppler were utilized during procedure). Indications:     CHF  History:         Patient has no prior history of Echocardiogram examinations.                  CHF; Risk Factors:Current Smoker.  Sonographer:     Mikki Harbor Referring Phys:  [id number] QVZD ALESKEROV Diagnosing Phys: Julien Nordmann MD  Sonographer Comments: Technically difficult study due to poor echo windows, suboptimal parasternal window, suboptimal apical window, no subcostal window and patient is obese. Image acquisition challenging due to respiratory motion. IMPRESSIONS  1. Left ventricular ejection fraction, by estimation, is 65 to 70%. Left ventricular ejection fraction by PLAX is 73 %. The left ventricle has normal function. The left ventricle has no regional wall motion abnormalities. There is mild left ventricular hypertrophy. Left ventricular diastolic parameters were normal.  2. Right ventricular systolic function is normal. The right ventricular size is normal. There is normal pulmonary artery systolic pressure. The estimated right ventricular systolic pressure is 27.0 mmHg.  3. The mitral valve is normal in structure. No evidence of mitral valve regurgitation. No evidence of mitral stenosis.  4. The aortic valve has an indeterminant number of cusps. Aortic valve regurgitation is not visualized. No aortic stenosis is  present.  5. The inferior vena cava is normal in size with greater than 50% respiratory variability, suggesting right atrial  pressure of 3 mmHg. FINDINGS  Left Ventricle: Left ventricular ejection fraction, by estimation, is 65 to 70%. Left ventricular ejection fraction by PLAX is 73 %. The left ventricle has normal function. The left ventricle has no regional wall motion abnormalities. Strain was performed and the global longitudinal strain is indeterminate. The left ventricular internal cavity size was normal in size. There is mild left ventricular hypertrophy. Left ventricular diastolic parameters were normal. Right Ventricle: The right ventricular size is normal. No increase in right ventricular wall thickness. Right ventricular systolic function is normal. There is normal pulmonary artery systolic pressure. The tricuspid regurgitant velocity is 2.18 m/s, and  with an assumed right atrial pressure of 8 mmHg, the estimated right ventricular systolic pressure is 27.0 mmHg. Left Atrium: Left atrial size was normal in size. Right Atrium: Right atrial size was normal in size. Pericardium: There is no evidence of pericardial effusion. Mitral Valve: The mitral valve is normal in structure. No evidence of mitral valve regurgitation. No evidence of mitral valve stenosis. MV peak gradient, 6.1 mmHg. The mean mitral valve gradient is 3.0 mmHg. Tricuspid Valve: The tricuspid valve is normal in structure. Tricuspid valve regurgitation is not demonstrated. No evidence of tricuspid stenosis. Aortic Valve: The aortic valve has an indeterminant number of cusps. Aortic valve regurgitation is not visualized. No aortic stenosis is present. Aortic valve mean gradient measures 6.0 mmHg. Aortic valve peak gradient measures 13.2 mmHg. Aortic valve area, by VTI measures 3.13 cm. Pulmonic Valve: The pulmonic valve was normal in structure. Pulmonic valve regurgitation is not visualized. No evidence of pulmonic stenosis. Aorta: The aortic root is normal in size and structure. Venous: The inferior vena cava is normal in size with greater than 50% respiratory  variability, suggesting right atrial pressure of 3 mmHg. IAS/Shunts: No atrial level shunt detected by color flow Doppler. Additional Comments: 3D was performed not requiring image post processing on an independent workstation and was indeterminate.  LEFT VENTRICLE PLAX 2D LV EF:         Left            Diastology                ventricular     LV e' medial:    8.05 cm/s                ejection        LV E/e' medial:  13.8                fraction by     LV e' lateral:   9.25 cm/s                PLAX is 73      LV E/e' lateral: 12.0                %. LVIDd:         4.80 cm LVIDs:         2.80 cm LV PW:         1.40 cm LV IVS:        1.20 cm LVOT diam:     2.10 cm LV SV:         107 LV SV Index:   39 LVOT Area:     3.46 cm  LEFT ATRIUM  Index LA diam:      4.20 cm 1.52 cm/m LA Vol (A4C): 61.5 ml 22.31 ml/m  AORTIC VALVE                     PULMONIC VALVE AV Area (Vmax):    2.91 cm      PV Vmax:       1.18 m/s AV Area (Vmean):   3.30 cm      PV Peak grad:  5.6 mmHg AV Area (VTI):     3.13 cm AV Vmax:           182.00 cm/s AV Vmean:          109.000 cm/s AV VTI:            0.341 m AV Peak Grad:      13.2 mmHg AV Mean Grad:      6.0 mmHg LVOT Vmax:         153.00 cm/s LVOT Vmean:        104.000 cm/s LVOT VTI:          0.308 m LVOT/AV VTI ratio: 0.90  AORTA Ao Root diam: 3.50 cm MITRAL VALVE                TRICUSPID VALVE MV Area (PHT): 3.46 cm     TR Peak grad:   19.0 mmHg MV Area VTI:   3.07 cm     TR Vmax:        218.00 cm/s MV Peak grad:  6.1 mmHg MV Mean grad:  3.0 mmHg     SHUNTS MV Vmax:       1.23 m/s     Systemic VTI:  0.31 m MV Vmean:      76.6 cm/s    Systemic Diam: 2.10 cm MV Decel Time: 219 msec MV E velocity: 111.00 cm/s MV A velocity: 101.00 cm/s MV E/A ratio:  1.10 Julien Nordmann MD Electronically signed by Julien Nordmann MD Signature Date/Time: 07/14/2023/3:16:04 PM    Final    DG Chest Port 1 View Result Date: 07/13/2023 CLINICAL DATA:  Central line placement EXAM: PORTABLE CHEST 1  VIEW COMPARISON:  07/13/2023 FINDINGS: Right central line has been placed with the tip in the SVC. No pneumothorax. Heart and mediastinal contours are within normal limits. No focal opacities or effusions. No acute bony abnormality. IMPRESSION: No active disease. Electronically Signed   By: Charlett Nose M.D.   On: 07/13/2023 19:39   DG Chest Port 1 View Result Date: 07/13/2023 CLINICAL DATA:  Hypoxia EXAM: PORTABLE CHEST 1 VIEW COMPARISON:  05/14/2022 FINDINGS: The heart size and mediastinal contours are within normal limits. Both lungs are clear. The visualized skeletal structures are unremarkable. IMPRESSION: No active disease. Electronically Signed   By: Sharlet Salina M.D.   On: 07/13/2023 17:44    Microbiology: Results for orders placed or performed during the hospital encounter of 07/13/23  Urine Culture     Status: None   Collection Time: 07/13/23  4:51 PM   Specimen: Urine, Random  Result Value Ref Range Status   Specimen Description   Final    URINE, RANDOM Performed at [facility], [address], [place], [place] [address]    Special Requests   Final    NONE Reflexed from [phone] Performed at [facility], [address] [place], [place] [address]    Culture   Final    NO GROWTH Performed at [facility], [address] [place], [place] [address]    Report Status 07/14/2023 FINAL  Final  Culture, blood (Routine X 2) w Reflex to ID Panel     Status: None   Collection Time: 07/13/23  5:59 PM   Specimen: Right Antecubital; Blood  Result Value Ref Range Status   Specimen Description RIGHT ANTECUBITAL  Final   Special Requests   Final    BOTTLES DRAWN AEROBIC AND ANAEROBIC Blood Culture adequate volume   Culture   Final    NO GROWTH 5 DAYS Performed at [facility], [address] [place], [place] [address]    Report Status 07/18/2023 FINAL  Final  Culture, blood (Routine X 2) w Reflex to ID Panel     Status: None   Collection Time:  07/13/23  6:20 PM   Specimen: BLOOD RIGHT HAND  Result Value Ref Range Status   Specimen Description BLOOD RIGHT HAND RT THUMB  Final   Special Requests   Final    BOTTLES DRAWN AEROBIC ONLY Blood Culture results may not be optimal due to an inadequate volume of blood received in culture bottles   Culture   Final    NO GROWTH 5 DAYS Performed at [facility], [address] [place], [place] [address]    Report Status 07/18/2023 FINAL  Final  MRSA Next Gen by PCR, Nasal     Status: None   Collection Time: 07/14/23  2:06 AM   Specimen: Nasal Mucosa; Nasal Swab  Result Value Ref Range Status   MRSA by PCR Next Gen NOT DETECTED NOT DETECTED Final    Comment: (NOTE) The GeneXpert MRSA Assay (FDA approved for NASAL specimens only), is one component of a comprehensive MRSA colonization surveillance program. It is not intended to diagnose MRSA infection nor to guide or monitor treatment for MRSA infections. Test performance is not FDA approved in patients less than 35 years old. Performed at [facility], [address] [place], [place] [address]   Culture, blood (Routine X 2) w Reflex to ID Panel     Status: None   Collection Time: 07/21/23  1:42 PM   Specimen: BLOOD  Result Value Ref Range Status   Specimen Description BLOOD BLOOD LEFT HAND  Final   Special Requests   Final    BOTTLES DRAWN AEROBIC AND ANAEROBIC Blood Culture adequate volume   Culture   Final    NO GROWTH 5 DAYS Performed at [facility], [address], [place], [place] [address]    Report Status 07/26/2023 FINAL  Final  Culture, blood (Routine X 2) w Reflex to ID Panel     Status: None   Collection Time: 07/21/23  1:52 PM   Specimen: BLOOD  Result Value Ref Range Status   Specimen Description BLOOD BLOOD LEFT HAND  Final   Special Requests   Final    BOTTLES DRAWN AEROBIC AND ANAEROBIC Blood Culture adequate volume   Culture   Final    NO GROWTH 5 DAYS Performed at [facility], [address] [place], [place] [address]    Report Status 07/26/2023 FINAL  Final  MRSA Next Gen by PCR, Nasal     Status: None   Collection Time: 07/22/23  9:21 AM   Specimen: Nasal Mucosa; Nasal Swab  Result Value Ref Range Status   MRSA by PCR Next Gen NOT DETECTED NOT DETECTED Final    Comment: (NOTE) The GeneXpert MRSA Assay (FDA approved for NASAL specimens only),  is one component of a comprehensive MRSA colonization surveillance program. It is not intended to diagnose MRSA infection nor to guide or monitor treatment for MRSA infections. Test performance is not FDA approved in patients less than 72 years old. Performed at [facility], [address] [place], [place] [address]     Labs: CBC: Recent Labs  Lab 08/03/23 0442  WBC 9.4  HGB 12.2*  HCT 34.9*  MCV 97.5  PLT 275   Basic Metabolic Panel: Recent Labs  Lab 08/01/23 0539 08/02/23 0509 08/03/23 0442 08/04/23 0443 08/05/23 0506  NA 133* 134* 131* 130* 133*  K 3.0* 3.5 3.2* 3.2* 3.2*  CL 89* 94* 96* 96* 96*  CO2 35* 32 28 27 28   GLUCOSE 112* 118* 113* 93 101*  BUN 16 18 19  21* 20  CREATININE 0.92 0.88 0.89 0.90 0.82  CALCIUM 8.7* 9.0 9.0 8.9 9.0  MG 2.0 2.1 1.9 2.0 2.0   Liver Function Tests: No results for input(s): "AST", "ALT", "ALKPHOS", "BILITOT", "PROT", "ALBUMIN" in the last 168 hours. CBG: Recent Labs  Lab 07/29/23 1757 07/29/23 2314 07/30/23 0521  GLUCAP 111* 91 85    Discharge time spent: greater than 30 minutes.  Signed: Lurene Shadow, MD [facility] 08/05/2023

## 2023-08-05 NOTE — TOC Transition Note (Signed)
 Transition of Care [facility]) - Discharge Note   Patient Details  Name: [name] MRN: [id number] Date of Birth: 1965-09-05  Transition of [facility]) CM/SW Contact:  Chapman Fitch, RN Phone Number: 08/05/2023, 11:46 AM   Clinical Narrative:     Patient to discharge today Due to insurance unable to secure home health agency Met with patient at bedside, daughter [name] was on speaker phone Patient declines outpatient therapy at this time RW, [facility], and hospital bed referral made to [facility] with [employer] RW and BSC to delivered to room prior to discharge.   Patient and daughter aware that hospital bed will not be delivered prior to discharge.  Daughter to purchase a recliner for the home  Final next level of care: Home/Self Care Barriers to Discharge: No Barriers Identified   Patient Goals and CMS Choice            Discharge Placement                       Discharge Plan and Services Additional resources added to the After Visit Summary for                  DME Arranged: Bedside commode, Hospital bed, Walker rolling DME Agency: [employer] Date DME Agency Contacted: 08/12/23   Representative spoke with at DME Agency: Shaune Spittle            Social Drivers of Health (SDOH) Interventions SDOH Screenings   Food Insecurity: No Food Insecurity (07/14/2023)  Housing: Low Risk  (07/14/2023)  Transportation Needs: No Transportation Needs (07/14/2023)  Utilities: Not At Risk (07/14/2023)  Tobacco Use: High Risk (07/13/2023)     Readmission Risk Interventions     No data to display

## 2023-08-05 NOTE — Progress Notes (Cosign Needed)
 Patient is not able to walk the distance required to go the bathroom, or he/she is unable to safely negotiate stairs required to access the bathroom.  A 3in1 BSC will alleviate this problem        Durable Medical Equipment  (From admission, onward)           Start     Ordered   08/05/23 1134  For home use only DME Hospital bed  Once       Question Answer Comment  Length of Need 6 Months   Patient has (list medical condition): OSA   The above medical condition requires: Patient requires the ability to reposition frequently   Head must be elevated greater than: 30 degrees   Bed type Semi-electric   Support Surface: Gel Overlay      08/05/23 1135   08/05/23 0000  For home use only DME Hospital bed       Question Answer Comment  Length of Need Lifetime   Head must be elevated greater than: 30 degrees   Bed type Semi-electric      08/05/23 1037   08/05/23 0000  DME Bedside commode       Question:  Patient needs a bedside commode to treat with the following condition  Answer:  General weakness   08/05/23 1037   08/05/23 0000  For home use only DME Walker rolling       Question Answer Comment  Walker: Other   Comments 2 wheels   Patient needs a walker to treat with the following condition General weakness      08/05/23 1037   07/16/23 1602  For home use only DME Walker rolling  Once       Comments: Bariatric  Question Answer Comment  Walker: With 5 Inch Wheels   Patient needs a walker to treat with the following condition Weakness      07/16/23 1602   07/16/23 1602  For home use only DME Bedside commode  Once       Comments: Bariatric  Question:  Patient needs a bedside commode to treat with the following condition  Answer:  Weakness   07/16/23 1602

## 2023-08-19 ENCOUNTER — Other Ambulatory Visit: Payer: Self-pay | Admitting: Adult Health

## 2023-08-19 DIAGNOSIS — J189 Pneumonia, unspecified organism: Secondary | ICD-10-CM

## 2023-08-20 ENCOUNTER — Ambulatory Visit
Admission: RE | Admit: 2023-08-20 | Discharge: 2023-08-20 | Disposition: A | Discharge status: Home / Self Care | Payer: MEDICAID | Source: Ambulatory Visit | Attending: Adult Health | Admitting: Adult Health

## 2023-08-20 ENCOUNTER — Ambulatory Visit
Admission: RE | Admit: 2023-08-20 | Discharge: 2023-08-20 | Disposition: A | Discharge status: Home / Self Care | Payer: MEDICAID | Attending: Adult Health | Admitting: Adult Health

## 2023-08-20 DIAGNOSIS — J189 Pneumonia, unspecified organism: Secondary | ICD-10-CM | POA: Insufficient documentation

## 2023-08-27 ENCOUNTER — Inpatient Hospital Stay: Payer: MEDICAID | Attending: Internal Medicine | Admitting: Internal Medicine

## 2023-08-27 ENCOUNTER — Inpatient Hospital Stay: Payer: MEDICAID

## 2023-08-27 ENCOUNTER — Encounter: Payer: Self-pay | Admitting: Internal Medicine

## 2023-08-27 VITALS — BP 120/85 | HR 116 | Temp 98.6°F | Resp 24 | Ht 74.0 in | Wt 342.4 lb

## 2023-08-27 DIAGNOSIS — Z811 Family history of alcohol abuse and dependence: Secondary | ICD-10-CM | POA: Diagnosis not present

## 2023-08-27 DIAGNOSIS — Z9049 Acquired absence of other specified parts of digestive tract: Secondary | ICD-10-CM | POA: Diagnosis not present

## 2023-08-27 DIAGNOSIS — Z79899 Other long term (current) drug therapy: Secondary | ICD-10-CM | POA: Diagnosis not present

## 2023-08-27 DIAGNOSIS — Z800 Family history of malignant neoplasm of digestive organs: Secondary | ICD-10-CM | POA: Insufficient documentation

## 2023-08-27 DIAGNOSIS — R0602 Shortness of breath: Secondary | ICD-10-CM | POA: Diagnosis not present

## 2023-08-27 DIAGNOSIS — Z8249 Family history of ischemic heart disease and other diseases of the circulatory system: Secondary | ICD-10-CM | POA: Insufficient documentation

## 2023-08-27 DIAGNOSIS — J45909 Unspecified asthma, uncomplicated: Secondary | ICD-10-CM | POA: Insufficient documentation

## 2023-08-27 DIAGNOSIS — Z8041 Family history of malignant neoplasm of ovary: Secondary | ICD-10-CM | POA: Diagnosis not present

## 2023-08-27 DIAGNOSIS — Z8042 Family history of malignant neoplasm of prostate: Secondary | ICD-10-CM | POA: Insufficient documentation

## 2023-08-27 DIAGNOSIS — Z803 Family history of malignant neoplasm of breast: Secondary | ICD-10-CM | POA: Insufficient documentation

## 2023-08-27 DIAGNOSIS — Z5986 Financial insecurity: Secondary | ICD-10-CM | POA: Insufficient documentation

## 2023-08-27 DIAGNOSIS — Z7901 Long term (current) use of anticoagulants: Secondary | ICD-10-CM | POA: Insufficient documentation

## 2023-08-27 DIAGNOSIS — Z8349 Family history of other endocrine, nutritional and metabolic diseases: Secondary | ICD-10-CM | POA: Insufficient documentation

## 2023-08-27 DIAGNOSIS — I82621 Acute embolism and thrombosis of deep veins of right upper extremity: Secondary | ICD-10-CM | POA: Insufficient documentation

## 2023-08-27 DIAGNOSIS — T782XXA Anaphylactic shock, unspecified, initial encounter: Secondary | ICD-10-CM | POA: Diagnosis not present

## 2023-08-27 DIAGNOSIS — Z83438 Family history of other disorder of lipoprotein metabolism and other lipidemia: Secondary | ICD-10-CM | POA: Diagnosis not present

## 2023-08-27 DIAGNOSIS — G4733 Obstructive sleep apnea (adult) (pediatric): Secondary | ICD-10-CM | POA: Diagnosis not present

## 2023-08-27 DIAGNOSIS — Z8701 Personal history of pneumonia (recurrent): Secondary | ICD-10-CM | POA: Insufficient documentation

## 2023-08-27 DIAGNOSIS — E669 Obesity, unspecified: Secondary | ICD-10-CM | POA: Diagnosis not present

## 2023-08-27 DIAGNOSIS — Z87891 Personal history of nicotine dependence: Secondary | ICD-10-CM | POA: Insufficient documentation

## 2023-08-27 DIAGNOSIS — Z882 Allergy status to sulfonamides status: Secondary | ICD-10-CM | POA: Insufficient documentation

## 2023-08-27 DIAGNOSIS — R079 Chest pain, unspecified: Secondary | ICD-10-CM | POA: Insufficient documentation

## 2023-08-27 DIAGNOSIS — Z8261 Family history of arthritis: Secondary | ICD-10-CM | POA: Insufficient documentation

## 2023-08-27 MED ORDER — APIXABAN 5 MG PO TABS: 5.0000 mg | ORAL_TABLET | Freq: Two times a day (BID) | ORAL | 0 refills | Status: DC

## 2023-08-27 NOTE — Progress Notes (Signed)
 Pt saw PCP yesterday, chest xray done, still has pneumonia.  Pt has a cough, sometimes has pain in his chest when coughing.  Pts appetite is 25% normal, no supplements, yogurt. Having some nausea and vomiting.

## 2023-08-27 NOTE — Assessment & Plan Note (Addendum)
#   MARCH 30th, 2025- [facility] line] PROVOKED- Occlusive DVT throughout the right basilic, axillary, and subclavian veins-currently on Eliquis .  Significant improvement noted from baseline as per family.  However not back to baseline/normal compared to the left upper extremity.  Currently on Eliquis .  # Will get an ultrasound of the right upper extremity.  Discussed the need for anticoagulation and benefit in 6 weeks- to 3 months.  Refill Eliquis  for 1 month.  Will again reevaluate at next visit.  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   Thank you Ms.Flinchum FNP for allowing me to participate in the care of your pleasant patient. Please do not hesitate to contact me with questions or concerns in the interim.  mychart # DISPOSITION: # no labs-  # US  UE in 1 week ordered # follow up 1 month- MD: no labs-Dr.B

## 2023-08-27 NOTE — Progress Notes (Signed)
  [facility] CONSULT NOTE  Patient Care Team: Hamrick, Orest Bio, MD as PCP - General (Family Medicine) Gwyn Leos, MD as Consulting Physician (Hematology and Oncology)  CHIEF COMPLAINTS/PURPOSE OF CONSULTATION: DVT  Oncology History   No history exists.     HISTORY OF PRESENTING ILLNESS: Patient ambulating-independently. Accompanied by daughter.   [name] 58 y.o.  male with history of obesity/OSA-was recently admitted the hospital for elective surgery for inguinal hernia repair which was complicated by bleeding at surgery.  Postoperatively-patient had FFP and platelets.  This was complicated by deteriorated and developed circulatory shock, hypoxemia, swelling worse on lower extremities. He had code RRT called and was emergently brought to ICU due to shock with hypoxemia. He was treated for anaphylactic non febrile blood product reaction vs TRALI.   During hospitalization patient also noted to have swelling of the right upper extremity-when the PICC line was in place.  Patient started on Eliquis  and PICC line taken out on the same day.  Patient is currently compliant with Eliquis .   He denies any falls but denies any significant bleeding.  Recently patient had repeat chest x-ray concern for ongoing pneumonia-restarted back on levofloxacin and also prednisone .   Pt has a cough, sometimes has pain in his chest when coughing.   Otherwise denies any history of blood clots.  Denies any family history of blood clots.  Review of Systems  Constitutional:  Negative for chills, diaphoresis, fever, malaise/fatigue and weight loss.  HENT:  Negative for nosebleeds and sore throat.   Eyes:  Negative for double vision.  Respiratory:  Positive for cough and shortness of breath. Negative for hemoptysis, sputum production and wheezing.   Cardiovascular:  Negative for chest pain, palpitations, orthopnea and leg swelling.  Gastrointestinal:  Negative for abdominal pain,  blood in stool, constipation, diarrhea, heartburn, melena, nausea and vomiting.  Genitourinary:  Negative for dysuria, frequency and urgency.  Musculoskeletal:  Negative for back pain and joint pain.  Skin: Negative.  Negative for itching and rash.  Neurological:  Negative for dizziness, tingling, focal weakness, weakness and headaches.  Endo/Heme/Allergies:  Does not bruise/bleed easily.  Psychiatric/Behavioral:  Negative for depression. The patient is not nervous/anxious and does not have insomnia.     MEDICAL HISTORY:  Past Medical History:  Diagnosis Date   Asthma    as a child   Bronchitis 03/2016   pt has recovered from bronchitis   Chickenpox    Depression    Frequent headaches    GERD (gastroesophageal reflux disease)    when I was younger, better now.   Hypertension    Migraines    history of, not now   OSA (obstructive sleep apnea)     SURGICAL HISTORY: Past Surgical History:  Procedure Laterality Date   APPENDECTOMY  1977   HERNIA REPAIR     HYDROCELE EXCISION Right 06/01/2016   Procedure: HYDROCELECTOMY ADULT;  Surgeon: Dustin Gimenez, MD;  Location: [facility];  Service: Urology;  Laterality: Right;   knee Left 1994   KNEE ARTHROSCOPY Right 1992   LAPAROTOMY N/A 07/13/2023   Procedure: LAPAROTOMY, EXPLORATORY;  Surgeon: Conrado Delay, DO;  Location: [facility];  Service: General;  Laterality: N/A;  Pink pad for case is needed possilble open   LAPAROTOMY N/A 07/17/2023   Procedure: LAPAROTOMY, EXPLORATORY, ABDOMINAL WASHOUT WITH CLOSURE;  Surgeon: Emmalene Hare, MD;  Location: [facility];  Service: General;  Laterality: N/A;   UMBILICAL HERNIA REPAIR  2013   VASECTOMY N/A 06/01/2016  Procedure: VASECTOMY;  Surgeon: Dustin Gimenez, MD;  Location: [facility];  Service: Urology;  Laterality: N/A;    SOCIAL HISTORY: Social History   Socioeconomic History   Marital status: Widowed    Spouse name: Not on file   Number of children: Not on file   Years of education: Not on  file   Highest education level: Not on file  Occupational History   Not on file  Tobacco Use   Smoking status: Former    Current packs/day: 0.50    Types: Cigarettes   Smokeless tobacco: Never   Tobacco comments:    March 11 2016  Vaping Use   Vaping status: Never Used  Substance and Sexual Activity   Alcohol use: Not Currently    Alcohol/week: 4.0 - 6.0 standard drinks of alcohol    Types: 4 - 6 Shots of liquor per week   Drug use: No   Sexual activity: Not on file  Other Topics Concern   Not on file  Social History Narrative   Single.   2 children.   Works independently as Building services engineer.   Enjoys reading, watching tv.   Social Drivers of Health   Financial Resource Strain: Medium Risk (07/07/2023)   Received from [facility]   Overall Financial Resource Strain (CARDIA)    Difficulty of Paying Living Expenses: Somewhat hard  Food Insecurity: No Food Insecurity (07/14/2023)   Hunger Vital Sign    Worried About Running Out of Food in the Last Year: Never true    Ran Out of Food in the Last Year: Never true  Recent Concern: Food Insecurity - Food Insecurity Present (07/07/2023)   Received from [facility]   Hunger Vital Sign    Worried About Running Out of Food in the Last Year: Sometimes true    Ran Out of Food in the Last Year: Sometimes true  Transportation Needs: No Transportation Needs (07/14/2023)   PRAPARE - Administrator, Civil Service (Medical): No    Lack of Transportation (Non-Medical): No  Recent Concern: Transportation Needs - Unmet Transportation Needs (07/07/2023)   Received from [facility] - Transportation    In the past 12 months, has lack of transportation kept you from medical appointments or from getting medications?: Yes    Lack of Transportation (Non-Medical): Yes  Physical Activity: Not on file  Stress: Not on file  Social Connections: Not on file  Intimate Partner Violence: Not  At Risk (07/14/2023)   Humiliation, Afraid, Rape, and Kick questionnaire    Fear of Current or Ex-Partner: No    Emotionally Abused: No    Physically Abused: No    Sexually Abused: No    FAMILY HISTORY: Family History  Problem Relation Age of Onset   Ovarian cancer Mother    Alcohol abuse Father    Hyperlipidemia Father    Hypertension Father    Prostate cancer Maternal Uncle    Arthritis Maternal Grandmother    Colon cancer Maternal Grandmother    Ovarian cancer Maternal Grandmother    Breast cancer Maternal Grandmother    Hyperlipidemia Maternal Grandfather    Hypertension Maternal Grandfather    Hyperlipidemia Paternal Grandmother    Heart attack Paternal Grandmother    Hyperlipidemia Paternal Grandfather    Hypertension Paternal Grandfather    Bladder Cancer Neg Hx    Kidney disease Neg Hx     ALLERGIES:  is allergic to sulfa antibiotics.  MEDICATIONS:  Current Outpatient Medications  Medication Sig Dispense Refill   acetaminophen  (TYLENOL ) 325 MG tablet Take 2 tablets (650 mg total) by mouth every 6 (six) hours as needed for mild pain (pain score 1-3), fever or headache.     levofloxacin (LEVAQUIN) 750 MG tablet Take 750 mg by mouth daily.     predniSONE  (DELTASONE ) 20 MG tablet Take 20 mg by mouth 2 (two) times daily.     VENTOLIN  HFA 108 (90 Base) MCG/ACT inhaler Inhale 1 puff into the lungs every 4 (four) hours as needed.     apixaban  (ELIQUIS ) 5 MG TABS tablet Take 1 tablet (5 mg total) by mouth 2 (two) times daily. 60 tablet 0   No current facility-administered medications for this visit.    PHYSICAL EXAMINATION:  Vitals:   08/27/23 1045  BP: 120/85  Pulse: (!) 116  Resp: (!) 24  Temp: 98.6 F (37 C)  SpO2: 94%   Filed Weights   08/27/23 1045  Weight: (!) 342 lb 6.4 oz (155.3 kg)   Right UE- swelling; pulses intact. [Improved but not back to baseline]  Physical Exam Vitals and nursing note reviewed.  HENT:     Head: Normocephalic and  atraumatic.     Mouth/Throat:     Pharynx: Oropharynx is clear.  Eyes:     Extraocular Movements: Extraocular movements intact.     Pupils: Pupils are equal, round, and reactive to light.  Cardiovascular:     Rate and Rhythm: Normal rate and regular rhythm.  Pulmonary:     Comments: Decreased breath sounds bilaterally.  Abdominal:     Palpations: Abdomen is soft.  Musculoskeletal:        General: Normal range of motion.     Cervical back: Normal range of motion.  Skin:    General: Skin is warm.  Neurological:     General: No focal deficit present.     Mental Status: He is alert and oriented to person, place, and time.  Psychiatric:        Behavior: Behavior normal.        Judgment: Judgment normal.      LABORATORY DATA:  I have reviewed the data as listed Lab Results  Component Value Date   WBC 9.4 08/03/2023   HGB 12.2 (L) 08/03/2023   HCT 34.9 (L) 08/03/2023   MCV 97.5 08/03/2023   PLT 275 08/03/2023   Recent Labs    07/22/23 0616 07/23/23 0800 07/25/23 0550 07/26/23 0558 07/27/23 0437 08/03/23 0442 08/04/23 0443 08/05/23 0506  NA 137   < > 132* 132*   < > 131* 130* 133*  K 4.2   < > 5.3* 5.1   < > 3.2* 3.2* 3.2*  CL 106   < > 100 98   < > 96* 96* 96*  CO2 24   < > 26 28   < > 28 27 28   GLUCOSE 138*   < > 107* 81   < > 113* 93 101*  BUN 28*   < > 27* 26*   < > 19 21* 20  CREATININE 0.80   < > 0.80 0.90   < > 0.89 0.90 0.82  CALCIUM 8.3*   < > 8.5* 8.7*   < > 9.0 8.9 9.0  GFRNONAA >60   < > >60 >60   < > >60 >60 >60  PROT 5.2*  --  5.5* 6.0*  --   --   --   --   ALBUMIN  1.8*  --  1.6* 1.7*  --   --   --   --   AST 29  --  45* 73*  --   --   --   --   ALT 16  --  26 33  --   --   --   --   ALKPHOS 80  --  73 75  --   --   --   --   BILITOT 1.4*  --  1.6* 2.1*  --   --   --   --   BILIDIR  --   --   --  0.9*  --   --   --   --    < > = values in this interval not displayed.    RADIOGRAPHIC STUDIES: I have personally reviewed the radiological images as  listed and agreed with the findings in the report. DG Chest 2 View Result Date: 08/20/2023 CLINICAL DATA:  History of septic shock with difficulty breathing, initial encounter EXAM: CHEST - 2 VIEW COMPARISON:  07/26/2023 CT FINDINGS: Cardiac shadow is within normal limits. The lungs are well aerated bilaterally. Bilateral airspace opacities are seen in the lower lobes but significantly improved when compared with the prior CT examination. No sizable effusion is noted. No bony abnormality is seen. IMPRESSION: Resolving airspace opacity in the bases bilaterally. Electronically Signed   By: Violeta Grey M.D.   On: 08/20/2023 10:28     Arm DVT (deep venous thromboembolism), acute, right (HCC) # MARCH 30th, 2025- [facility] line] PROVOKED- Occlusive DVT throughout the right basilic, axillary, and subclavian veins-currently on Eliquis .  Significant improvement noted from baseline as per family.  However not back to baseline/normal compared to the left upper extremity.  Currently on Eliquis .  # Will get an ultrasound of the right upper extremity.  Discussed the need for anticoagulation and benefit in 6 weeks- to 3 months.  Refill Eliquis  for 1 month.  Will again reevaluate at next visit.  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   Thank you Ms.Flinchum FNP for allowing me to participate in the care of your pleasant patient. Please do not hesitate to contact me with questions or concerns in the interim.  mychart # DISPOSITION: # no labs-  # US  UE in 1 week ordered # follow up 1 month- MD: no labs-Dr.B  The above plan of care was discussed with patient/family in detail.  My contact information was given to the patient/family.     Gwyn Leos, MD 08/27/2023 11:41 AM

## 2023-09-02 ENCOUNTER — Ambulatory Visit
Admission: RE | Admit: 2023-09-02 | Discharge: 2023-09-02 | Disposition: A | Discharge status: Home / Self Care | Payer: MEDICAID | Source: Ambulatory Visit | Attending: Internal Medicine | Admitting: Internal Medicine

## 2023-09-02 DIAGNOSIS — I82621 Acute embolism and thrombosis of deep veins of right upper extremity: Secondary | ICD-10-CM | POA: Diagnosis present

## 2023-09-08 ENCOUNTER — Other Ambulatory Visit
Admission: RE | Admit: 2023-09-08 | Discharge: 2023-09-08 | Disposition: A | Discharge status: Home / Self Care | Payer: MEDICAID | Source: Ambulatory Visit | Attending: Student in an Organized Health Care Education/Training Program | Admitting: Student in an Organized Health Care Education/Training Program

## 2023-09-08 ENCOUNTER — Ambulatory Visit: Payer: MEDICAID | Admitting: Student in an Organized Health Care Education/Training Program

## 2023-09-08 ENCOUNTER — Encounter: Payer: Self-pay | Admitting: Student in an Organized Health Care Education/Training Program

## 2023-09-08 VITALS — BP 120/80 | HR 86 | Temp 98.3°F | Ht 74.0 in | Wt 357.6 lb

## 2023-09-08 DIAGNOSIS — R0602 Shortness of breath: Secondary | ICD-10-CM

## 2023-09-08 DIAGNOSIS — J4540 Moderate persistent asthma, uncomplicated: Secondary | ICD-10-CM | POA: Diagnosis not present

## 2023-09-08 DIAGNOSIS — Z6841 Body Mass Index (BMI) 40.0 and over, adult: Secondary | ICD-10-CM

## 2023-09-08 DIAGNOSIS — E66813 Obesity, class 3: Secondary | ICD-10-CM | POA: Diagnosis not present

## 2023-09-08 LAB — BASIC METABOLIC PANEL WITH GFR
Anion gap: 5 (ref 5–15)
BUN: 10 mg/dL (ref 6–20)
CO2: 24 mmol/L (ref 22–32)
Calcium: 9 mg/dL (ref 8.9–10.3)
Chloride: 110 mmol/L (ref 98–111)
Creatinine, Ser: 0.8 mg/dL (ref 0.61–1.24)
GFR, Estimated: >60 mL/min (ref >60)
Glucose, Bld: 94 mg/dL (ref 70–99)
Potassium: 3.9 mmol/L (ref 3.5–5.1)
Sodium: 139 mmol/L (ref 135–145)

## 2023-09-08 LAB — BRAIN NATRIURETIC PEPTIDE: B Natriuretic Peptide: 73.9 pg/mL (ref 0.0–100.0)

## 2023-09-08 NOTE — Progress Notes (Signed)
 Assessment & Plan:   #Shortness of breath (Primary) #Moderate Persistent Asthma #Acute Respiratory Failure secondary to blood transfusion reaction  Patient is presenting to the hospital for the evaluation of shortness of breath following recent hospitalization where he required intubation.  This was in the setting of likely blood transfusion reaction following acute hemorrhage during surgery.  He had 2 episodes of shortness of breath, cough, and wheeze that required prednisone  with some improvement.  On exam today, his lungs are clear and he has no wheeze but is showing significant lower extremity edema.  Given he is got a reported history of asthma in his 30s that required intubation and hospitalization, this is highest on my differential.  He also has a significant smoking history and COPD or COPD/asthma overlap is certainly possible. I will work it up with a pulmonary function test.  We will also order a TTE in addition to BNP and BMP to work him up for decompensated heart failure.  Finally, I have reviewed his chest CT during his hospitalization which showed bilateral infiltrates likely secondary to hospital-acquired pneumonia versus TRALI/TACO.  I will obtain a high-resolution chest CT to rule out interstitial lung disease for completion of workup.  - B Nat Peptide; Future - Basic metabolic panel with GFR; Future - Pulmonary Function Test; Future - ECHOCARDIOGRAM COMPLETE; Future - CT CHEST HIGH RESOLUTION; Future   Return in about 4 weeks (around 10/06/2023).  I spent 45 minutes caring for this patient today, including preparing to see the patient, obtaining a medical history , reviewing a separately obtained history, performing a medically appropriate examination and/or evaluation, counseling and educating the patient/family/caregiver, ordering medications, tests, or procedures, documenting clinical information in the electronic health record, and independently interpreting results (not  separately reported/billed) and communicating results to the patient/family/caregiver  Vergia Glasgow, MD  Pulmonary Critical Care  End of visit medications:  No orders of the defined types were placed in this encounter.    Current Outpatient Medications:    acetaminophen  (TYLENOL ) 325 MG tablet, Take 2 tablets (650 mg total) by mouth every 6 (six) hours as needed for mild pain (pain score 1-3), fever or headache., Disp: , Rfl:    apixaban  (ELIQUIS ) 5 MG TABS tablet, Take 1 tablet (5 mg total) by mouth 2 (two) times daily., Disp: 60 tablet, Rfl: 0   levofloxacin (LEVAQUIN) 750 MG tablet, Take 750 mg by mouth daily., Disp: , Rfl:    VENTOLIN  HFA 108 (90 Base) MCG/ACT inhaler, Inhale 1 puff into the lungs every 4 (four) hours as needed., Disp: , Rfl:    predniSONE  (DELTASONE ) 20 MG tablet, Take 20 mg by mouth 2 (two) times daily. (Patient not taking: Reported on 09/08/2023), Disp: , Rfl:    Subjective:   PATIENT ID: [name] GENDER: male DOB: June 21, 1965, MRN: [id number]  Chief Complaint  Patient presents with   New Patient (Initial Visit)    New: History of pneumonia, x-ray: 4/25/5, CT: 07/26/23  Following up on pneumonia and edema in his lungs after a blood transfusion. Was hospitalized in March - April and had to be on a ventilator and had aspiration. SOB, DOE, O2 can drop to 75-78% with cough and 95-98% while at rest. No O2 at home. Has a strong possibility of PE due to previous blood clots while in the hospital.    HPI  Patient is a pleasant 58 year old male presenting to clinic for posthospital discharge follow-up.  Patient was admitted to [facility] on 07/13/2023 for an inguinal  hernia repair that was complicated by bleed and postoperative hypoxic respiratory failure requiring ICU admission.  Course was complicated by likely blood transfusion related reaction as well as by likely hospital-acquired pneumonia. These were treated with broad-spectrum antibiotics as well as  diuretics.  He did have to be briefly intubated throughout this hospitalization for respiratory failure in the setting of bowel leak triggered by dehiscence from significant cough. He was discharged on 08/05/2023. He was also found to have an upper extremity DVT in the setting of PICC line placement for which he's on Apixaban  and is followed by Dr. Valentine Gasmen from hem/onc.  Following hospital discharge, he has felt somewhat better but has had 2 episodes with increased shortness of breath and cough.  He was seen by his primary care physician and has received a course of antibiotics as well as 2 courses of prednisone  (40 mg for 5 days).  The prednisone  has helped with symptoms.  He has not had any fevers or chills, nor has he had any chest pain or chest tightness.  The cough has been present since discharge but was not present prior to this hospitalization.  He has had some weight gain as well as some increase in his lower extremity edema.  Patient's medical history is notable for a diagnosis of asthma in his childhood that he thinks he grew out of.  He does tell me that he had to be intubated in his early 30s after walking through a perfume store where he was exposed to scents.  He felt very short of breath and EMS were activated and he was intubated in the field.  He was also intubated secondary to electrocution.  Patient has a history of smoking, with around 40 to 50 pack years of smoking history.  He quit smoking in March 2025.  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  He was deployed in [employer] and does not recall burn pit exposure.  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 They have two dogs and a cat. He had a  pet bird between 54 and 1994. No illicit drug use reported.  Ancillary information including prior medications, full medical/surgical/family/social histories, and PFTs (when available) are listed below and have been reviewed.   Review of Systems  Constitutional:  Negative for chills, fever and weight loss.  Respiratory:  Positive for cough, sputum production and shortness of breath. Negative for hemoptysis and wheezing.   Cardiovascular:  Negative for chest pain.     Objective:   Vitals:   09/08/23 1309  BP: 120/80  Pulse: 86  Temp: 98.3 F (36.8 C)  TempSrc: Oral  SpO2: 96%  Weight: (!) 357 lb 9.6 oz (162.2 kg)  Height: 6\' 2"  (1.88 m)   96% on RA BMI Readings from Last 3 Encounters:  09/08/23 45.91 kg/m  08/27/23 43.96 kg/m  08/05/23 43.58 kg/m   Wt Readings from Last 3 Encounters:  09/08/23 (!) 357 lb 9.6 oz (162.2 kg)  08/27/23 (!) 342 lb 6.4 oz (155.3 kg)  08/05/23 (!) 339 lb 6.4 oz (154 kg)    Physical Exam Constitutional:      Appearance: He is obese.  Cardiovascular:     Rate and Rhythm:  Normal rate and regular rhythm.     Pulses: Normal pulses.     Heart sounds: Normal heart sounds.  Pulmonary:     Effort: Pulmonary effort is normal.     Breath sounds: Normal breath sounds. No wheezing.  Musculoskeletal:     Right lower leg: Edema present.     Left lower leg: Edema present.  Neurological:     General: No focal deficit present.     Mental Status: He is alert and oriented to person, place, and time. Mental status is at baseline.       Ancillary Information    Past Medical History:  Diagnosis Date   Asthma    as a child   Bronchitis 03/2016   pt has recovered from bronchitis   Chickenpox    Depression    Frequent headaches    GERD (gastroesophageal reflux disease)    when I was younger, better now.   Hypertension    Migraines    history of, not now   OSA (obstructive sleep apnea)      Family History  Problem Relation Age of Onset    Ovarian cancer Mother    Alcohol abuse Father    Hyperlipidemia Father    Hypertension Father    Prostate cancer Maternal Uncle    Arthritis Maternal Grandmother    Colon cancer Maternal Grandmother    Ovarian cancer Maternal Grandmother    Breast cancer Maternal Grandmother    Hyperlipidemia Maternal Grandfather    Hypertension Maternal Grandfather    Hyperlipidemia Paternal Grandmother    Heart attack Paternal Grandmother    Hyperlipidemia Paternal Grandfather    Hypertension Paternal Grandfather    Bladder Cancer Neg Hx    Kidney disease Neg Hx      Past Surgical History:  Procedure Laterality Date   APPENDECTOMY  1977   HERNIA REPAIR     HYDROCELE EXCISION Right 06/01/2016   Procedure: HYDROCELECTOMY ADULT;  Surgeon: Dustin Gimenez, MD;  Location: [facility];  Service: Urology;  Laterality: Right;   knee Left 1994   KNEE ARTHROSCOPY Right 1992   LAPAROTOMY N/A 07/13/2023   Procedure: LAPAROTOMY, EXPLORATORY;  Surgeon: Conrado Delay, DO;  Location: [facility];  Service: General;  Laterality: N/A;  Pink pad for case is needed possilble open   LAPAROTOMY N/A 07/17/2023   Procedure: LAPAROTOMY, EXPLORATORY, ABDOMINAL WASHOUT WITH CLOSURE;  Surgeon: Emmalene Hare, MD;  Location: [facility];  Service: General;  Laterality: N/A;   UMBILICAL HERNIA REPAIR  2013   VASECTOMY N/A 06/01/2016   Procedure: VASECTOMY;  Surgeon: Dustin Gimenez, MD;  Location: [facility];  Service: Urology;  Laterality: N/A;    Social History   Socioeconomic History   Marital status: Widowed    Spouse name: Not on file   Number of children: Not on file   Years of education: Not on file   Highest education level: Not on file  Occupational History   Not on file  Tobacco Use   Smoking status: Former    Current packs/day: 0.50    Types: Cigarettes   Smokeless tobacco: Never   Tobacco comments:    March 11 2016  Vaping Use   Vaping status: Never Used  Substance and Sexual Activity   Alcohol use: Not  Currently    Alcohol/week: 4.0 - 6.0 standard drinks of alcohol    Types: 4 - 6 Shots of liquor per week   Drug use: No   Sexual activity: Not on file  Other  Topics Concern   Not on file  Social History Narrative   Single.   2 children.   Works independently as Building services engineer.   Enjoys reading, watching tv.   Social Drivers of Health   Financial Resource Strain: Medium Risk (07/07/2023)   Received from [facility]   Overall Financial Resource Strain (CARDIA)    Difficulty of Paying Living Expenses: Somewhat hard  Food Insecurity: No Food Insecurity (07/14/2023)   Hunger Vital Sign    Worried About Running Out of Food in the Last Year: Never true    Ran Out of Food in the Last Year: Never true  Recent Concern: Food Insecurity - Food Insecurity Present (07/07/2023)   Received from [facility]   Hunger Vital Sign    Worried About Running Out of Food in the Last Year: Sometimes true    Ran Out of Food in the Last Year: Sometimes true  Transportation Needs: No Transportation Needs (07/14/2023)   PRAPARE - Administrator, Civil Service (Medical): No    Lack of Transportation (Non-Medical): No  Recent Concern: Transportation Needs - Unmet Transportation Needs (07/07/2023)   Received from [facility] - Transportation    In the past 12 months, has lack of transportation kept you from medical appointments or from getting medications?: Yes    Lack of Transportation (Non-Medical): Yes  Physical Activity: Not on file  Stress: Not on file  Social Connections: Not on file  Intimate Partner Violence: Not At Risk (07/14/2023)   Humiliation, Afraid, Rape, and Kick questionnaire    Fear of Current or Ex-Partner: No    Emotionally Abused: No    Physically Abused: No    Sexually Abused: No     Allergies  Allergen Reactions   Sulfa Antibiotics Anaphylaxis and Hives   Whole Blood Other (See Comments)    Recently had a blood  transfusion that resulted in systemic sepsis and organ failure.     CBC    Component Value Date/Time   WBC 9.4 08/03/2023 0442   RBC 3.58 (L) 08/03/2023 0442   HGB 12.2 (L) 08/03/2023 0442   HGB 16.6 12/20/2013 1121   HCT 34.9 (L) 08/03/2023 0442   HCT 50.1 12/20/2013 1121   PLT 275 08/03/2023 0442   PLT 185 12/20/2013 1121   MCV 97.5 08/03/2023 0442   MCV 97 12/20/2013 1121   MCH 34.1 (H) 08/03/2023 0442   MCHC 35.0 08/03/2023 0442   RDW 13.7 08/03/2023 0442   RDW 13.3 12/20/2013 1121   LYMPHSABS 2.2 07/29/2023 0621   LYMPHSABS 1.7 05/03/2012 0407   MONOABS 0.8 07/29/2023 0621   MONOABS 0.9 05/03/2012 0407   EOSABS 0.4 07/29/2023 0621   EOSABS 0.1 05/03/2012 0407   BASOSABS 0.1 07/29/2023 0621   BASOSABS 0.0 05/03/2012 0407    Pulmonary Functions Testing Results:     No data to display          Outpatient Medications Prior to Visit  Medication Sig Dispense Refill   acetaminophen  (TYLENOL ) 325 MG tablet Take 2 tablets (650 mg total) by mouth every 6 (six) hours as needed for mild pain (pain score 1-3), fever or headache.     apixaban  (ELIQUIS ) 5 MG TABS tablet Take 1 tablet (5 mg total) by mouth 2 (two) times daily. 60 tablet 0   levofloxacin (LEVAQUIN) 750 MG tablet Take 750 mg by mouth daily.     VENTOLIN  HFA 108 (90 Base) MCG/ACT inhaler  Inhale 1 puff into the lungs every 4 (four) hours as needed.     predniSONE  (DELTASONE ) 20 MG tablet Take 20 mg by mouth 2 (two) times daily. (Patient not taking: Reported on 09/08/2023)     No facility-administered medications prior to visit.

## 2023-09-10 ENCOUNTER — Other Ambulatory Visit: Payer: Self-pay | Admitting: Medical Genetics

## 2023-09-13 ENCOUNTER — Other Ambulatory Visit
Admission: RE | Admit: 2023-09-13 | Discharge: 2023-09-13 | Disposition: A | Discharge status: Home / Self Care | Payer: Self-pay | Source: Ambulatory Visit | Attending: Medical Genetics | Admitting: Medical Genetics

## 2023-09-13 ENCOUNTER — Other Ambulatory Visit: Payer: MEDICAID

## 2023-09-13 ENCOUNTER — Ambulatory Visit
Admission: RE | Admit: 2023-09-13 | Discharge: 2023-09-13 | Disposition: A | Discharge status: Home / Self Care | Payer: MEDICAID | Source: Ambulatory Visit | Attending: Family Medicine | Admitting: Family Medicine

## 2023-09-13 DIAGNOSIS — R0602 Shortness of breath: Secondary | ICD-10-CM | POA: Insufficient documentation

## 2023-09-14 ENCOUNTER — Encounter: Payer: Self-pay | Admitting: Gastroenterology

## 2023-09-21 LAB — GENECONNECT MOLECULAR SCREEN: Genetic Analysis Overall Interpretation: NEGATIVE

## 2023-09-28 ENCOUNTER — Encounter: Payer: Self-pay | Admitting: Internal Medicine

## 2023-09-28 ENCOUNTER — Inpatient Hospital Stay: Payer: MEDICAID | Attending: Internal Medicine | Admitting: Internal Medicine

## 2023-09-28 VITALS — BP 134/85 | HR 96 | Temp 98.3°F | Resp 20 | Ht 74.0 in | Wt 360.4 lb

## 2023-09-28 DIAGNOSIS — R059 Cough, unspecified: Secondary | ICD-10-CM | POA: Diagnosis not present

## 2023-09-28 DIAGNOSIS — Z882 Allergy status to sulfonamides status: Secondary | ICD-10-CM | POA: Diagnosis not present

## 2023-09-28 DIAGNOSIS — I82B11 Acute embolism and thrombosis of right subclavian vein: Secondary | ICD-10-CM | POA: Insufficient documentation

## 2023-09-28 DIAGNOSIS — Z8261 Family history of arthritis: Secondary | ICD-10-CM | POA: Insufficient documentation

## 2023-09-28 DIAGNOSIS — Z9049 Acquired absence of other specified parts of digestive tract: Secondary | ICD-10-CM | POA: Insufficient documentation

## 2023-09-28 DIAGNOSIS — Z803 Family history of malignant neoplasm of breast: Secondary | ICD-10-CM | POA: Insufficient documentation

## 2023-09-28 DIAGNOSIS — Z5986 Financial insecurity: Secondary | ICD-10-CM | POA: Diagnosis not present

## 2023-09-28 DIAGNOSIS — Z7901 Long term (current) use of anticoagulants: Secondary | ICD-10-CM | POA: Diagnosis not present

## 2023-09-28 DIAGNOSIS — R410 Disorientation, unspecified: Secondary | ICD-10-CM | POA: Diagnosis not present

## 2023-09-28 DIAGNOSIS — R0602 Shortness of breath: Secondary | ICD-10-CM | POA: Insufficient documentation

## 2023-09-28 DIAGNOSIS — Z86718 Personal history of other venous thrombosis and embolism: Secondary | ICD-10-CM | POA: Insufficient documentation

## 2023-09-28 DIAGNOSIS — K760 Fatty (change of) liver, not elsewhere classified: Secondary | ICD-10-CM | POA: Insufficient documentation

## 2023-09-28 DIAGNOSIS — I82621 Acute embolism and thrombosis of deep veins of right upper extremity: Secondary | ICD-10-CM | POA: Diagnosis not present

## 2023-09-28 DIAGNOSIS — R519 Headache, unspecified: Secondary | ICD-10-CM | POA: Diagnosis not present

## 2023-09-28 DIAGNOSIS — Z79899 Other long term (current) drug therapy: Secondary | ICD-10-CM | POA: Diagnosis not present

## 2023-09-28 DIAGNOSIS — Z87891 Personal history of nicotine dependence: Secondary | ICD-10-CM | POA: Diagnosis not present

## 2023-09-28 DIAGNOSIS — Z8249 Family history of ischemic heart disease and other diseases of the circulatory system: Secondary | ICD-10-CM | POA: Diagnosis not present

## 2023-09-28 DIAGNOSIS — E669 Obesity, unspecified: Secondary | ICD-10-CM | POA: Insufficient documentation

## 2023-09-28 DIAGNOSIS — M7989 Other specified soft tissue disorders: Secondary | ICD-10-CM | POA: Diagnosis not present

## 2023-09-28 DIAGNOSIS — I82A11 Acute embolism and thrombosis of right axillary vein: Secondary | ICD-10-CM | POA: Insufficient documentation

## 2023-09-28 DIAGNOSIS — R200 Anesthesia of skin: Secondary | ICD-10-CM | POA: Insufficient documentation

## 2023-09-28 DIAGNOSIS — Z800 Family history of malignant neoplasm of digestive organs: Secondary | ICD-10-CM | POA: Insufficient documentation

## 2023-09-28 DIAGNOSIS — R918 Other nonspecific abnormal finding of lung field: Secondary | ICD-10-CM | POA: Diagnosis not present

## 2023-09-28 DIAGNOSIS — I119 Hypertensive heart disease without heart failure: Secondary | ICD-10-CM | POA: Insufficient documentation

## 2023-09-28 DIAGNOSIS — Z83438 Family history of other disorder of lipoprotein metabolism and other lipidemia: Secondary | ICD-10-CM | POA: Insufficient documentation

## 2023-09-28 NOTE — Progress Notes (Signed)
 [facility] CONSULT NOTE  Patient Care Team: Hamrick, Orest Bio, MD as PCP - General (Family Medicine) Gwyn Leos, MD as Consulting Physician (Hematology and Oncology)  CHIEF COMPLAINTS/PURPOSE OF CONSULTATION: DVT  Oncology History   No history exists.     HISTORY OF PRESENTING ILLNESS: Patient ambulating-in in a wheelchair accompanied by daughter.   [name] 58 y.o.  male with history of obesity/OSA--provoked DVT of the right upper extremity [facility] line MARCH 2025-ICU-circulatory shock-post transfusion]-currently on Eliquis  is here for follow-up/and review results of the repeat ultrasound.  In the interim patient has been eval by cardiology for his ongoing leg swelling; and also currently being evaluated by pulmonary for ongoing shortness of breath.  Patient also has been awaiting referred to endocrinology for thyroid abnormalities.  Patient continues to complain of left thigh numbness mild swelling-also noted to have a fall and bruised his knee.  However denies any trauma to the head.  Patient complains of headache for the last few months.  6-7 on a scale of 10.  Intermittent episodes of delirium/and word finding difficulty-although overall much improved since discharge from the hospital.   He continues to have swelling of the right upper extremity overall improved but not back to baseline.  Review of Systems  Constitutional:  Negative for chills, diaphoresis, fever, malaise/fatigue and weight loss.  HENT:  Negative for nosebleeds and sore throat.   Eyes:  Negative for double vision.  Respiratory:  Positive for cough and shortness of breath. Negative for hemoptysis, sputum production and wheezing.   Cardiovascular:  Negative for chest pain, palpitations, orthopnea and leg swelling.  Gastrointestinal:  Negative for abdominal pain, blood in stool, constipation, diarrhea, heartburn, melena, nausea and vomiting.  Genitourinary:  Negative for dysuria,  frequency and urgency.  Musculoskeletal:  Negative for back pain and joint pain.  Skin: Negative.  Negative for itching and rash.  Neurological:  Negative for dizziness, tingling, focal weakness, weakness and headaches.  Endo/Heme/Allergies:  Does not bruise/bleed easily.  Psychiatric/Behavioral:  Negative for depression. The patient is not nervous/anxious and does not have insomnia.     MEDICAL HISTORY:  Past Medical History:  Diagnosis Date   Asthma    as a child   Bronchitis 03/2016   pt has recovered from bronchitis   Chickenpox    Depression    Frequent headaches    GERD (gastroesophageal reflux disease)    when I was younger, better now.   Hypertension    Migraines    history of, not now   OSA (obstructive sleep apnea)     SURGICAL HISTORY: Past Surgical History:  Procedure Laterality Date   APPENDECTOMY  1977   HERNIA REPAIR     HYDROCELE EXCISION Right 06/01/2016   Procedure: HYDROCELECTOMY ADULT;  Surgeon: Dustin Gimenez, MD;  Location: [facility] ORS;  Service: Urology;  Laterality: Right;   knee Left 1994   KNEE ARTHROSCOPY Right 1992   LAPAROTOMY N/A 07/13/2023   Procedure: LAPAROTOMY, EXPLORATORY;  Surgeon: Conrado Delay, DO;  Location: [facility] ORS;  Service: General;  Laterality: N/A;  Pink pad for case is needed possilble open   LAPAROTOMY N/A 07/17/2023   Procedure: LAPAROTOMY, EXPLORATORY, ABDOMINAL WASHOUT WITH CLOSURE;  Surgeon: Emmalene Hare, MD;  Location: [facility] ORS;  Service: General;  Laterality: N/A;   UMBILICAL HERNIA REPAIR  2013   VASECTOMY N/A 06/01/2016   Procedure: VASECTOMY;  Surgeon: Dustin Gimenez, MD;  Location: [facility] ORS;  Service: Urology;  Laterality: N/A;    SOCIAL  HISTORY: Social History   Socioeconomic History   Marital status: Widowed    Spouse name: Not on file   Number of children: Not on file   Years of education: Not on file   Highest education level: Not on file  Occupational History   Not on file  Tobacco Use   Smoking status:  Former    Current packs/day: 0.50    Types: Cigarettes   Smokeless tobacco: Never   Tobacco comments:    March 11 2016  Vaping Use   Vaping status: Never Used  Substance and Sexual Activity   Alcohol use: Not Currently    Alcohol/week: 4.0 - 6.0 standard drinks of alcohol    Types: 4 - 6 Shots of liquor per week   Drug use: No   Sexual activity: Not on file  Other Topics Concern   Not on file  Social History Narrative   Single.   2 children.   Works independently as Building services engineer.   Enjoys reading, watching tv.   Social Drivers of Health   Financial Resource Strain: Medium Risk (07/07/2023)   Received from [facility]   Overall Financial Resource Strain (CARDIA)    Difficulty of Paying Living Expenses: Somewhat hard  Food Insecurity: No Food Insecurity (07/14/2023)   Hunger Vital Sign    Worried About Running Out of Food in the Last Year: Never true    Ran Out of Food in the Last Year: Never true  Recent Concern: Food Insecurity - Food Insecurity Present (07/07/2023)   Received from [facility]   Hunger Vital Sign    Worried About Running Out of Food in the Last Year: Sometimes true    Ran Out of Food in the Last Year: Sometimes true  Transportation Needs: No Transportation Needs (07/14/2023)   PRAPARE - Administrator, Civil Service (Medical): No    Lack of Transportation (Non-Medical): No  Recent Concern: Transportation Needs - Unmet Transportation Needs (07/07/2023)   Received from [facility] - Transportation    In the past 12 months, has lack of transportation kept you from medical appointments or from getting medications?: Yes    Lack of Transportation (Non-Medical): Yes  Physical Activity: Not on file  Stress: Not on file  Social Connections: Not on file  Intimate Partner Violence: Not At Risk (07/14/2023)   Humiliation, Afraid, Rape, and Kick questionnaire    Fear of Current or Ex-Partner: No     Emotionally Abused: No    Physically Abused: No    Sexually Abused: No    FAMILY HISTORY: Family History  Problem Relation Age of Onset   Ovarian cancer Mother    Alcohol abuse Father    Hyperlipidemia Father    Hypertension Father    Prostate cancer Maternal Uncle    Arthritis Maternal Grandmother    Colon cancer Maternal Grandmother    Ovarian cancer Maternal Grandmother    Breast cancer Maternal Grandmother    Hyperlipidemia Maternal Grandfather    Hypertension Maternal Grandfather    Hyperlipidemia Paternal Grandmother    Heart attack Paternal Grandmother    Hyperlipidemia Paternal Grandfather    Hypertension Paternal Grandfather    Bladder Cancer Neg Hx    Kidney disease Neg Hx     ALLERGIES:  is allergic to sulfa antibiotics and whole blood.  MEDICATIONS:  Current Outpatient Medications  Medication Sig Dispense Refill   acetaminophen  (TYLENOL ) 325 MG tablet Take 2 tablets (  650 mg total) by mouth every 6 (six) hours as needed for mild pain (pain score 1-3), fever or headache.     apixaban  (ELIQUIS ) 5 MG TABS tablet Take 1 tablet (5 mg total) by mouth 2 (two) times daily. 60 tablet 0   cephALEXin  (KEFLEX ) 500 MG capsule Take 500 mg by mouth 3 (three) times daily.     Cholecalciferol (D 1000) 25 MCG (1000 UT) capsule Take 5,000 Units by mouth daily.     furosemide  (LASIX ) 20 MG tablet Take 30 mg by mouth daily.     omeprazole (PRILOSEC) 40 MG capsule Take 40 mg by mouth daily.     potassium chloride  (MICRO-K ) 10 MEQ CR capsule Take 10 mEq by mouth every other day.     VENTOLIN  HFA 108 (90 Base) MCG/ACT inhaler Inhale 1 puff into the lungs every 4 (four) hours as needed.     levofloxacin (LEVAQUIN) 750 MG tablet Take 750 mg by mouth daily.     predniSONE  (DELTASONE ) 20 MG tablet Take 20 mg by mouth 2 (two) times daily. (Patient not taking: Reported on 09/08/2023)     No current facility-administered medications for this visit.    PHYSICAL EXAMINATION:  Vitals:    09/28/23 1327  BP: 134/85  Pulse: 96  Resp: 20  Temp: 98.3 F (36.8 C)  SpO2: 96%    Filed Weights   09/28/23 1327  Weight: (!) 360 lb 6.4 oz (163.5 kg)    Right UE- swelling; pulses intact. [Improved but not back to baseline]  Physical Exam Vitals and nursing note reviewed.  HENT:     Head: Normocephalic and atraumatic.     Mouth/Throat:     Pharynx: Oropharynx is clear.  Eyes:     Extraocular Movements: Extraocular movements intact.     Pupils: Pupils are equal, round, and reactive to light.  Cardiovascular:     Rate and Rhythm: Normal rate and regular rhythm.  Pulmonary:     Comments: Decreased breath sounds bilaterally.  Abdominal:     Palpations: Abdomen is soft.  Musculoskeletal:        General: Normal range of motion.     Cervical back: Normal range of motion.  Skin:    General: Skin is warm.  Neurological:     General: No focal deficit present.     Mental Status: He is alert and oriented to person, place, and time.  Psychiatric:        Behavior: Behavior normal.        Judgment: Judgment normal.      LABORATORY DATA:  I have reviewed the data as listed Lab Results  Component Value Date   WBC 9.4 08/03/2023   HGB 12.2 (L) 08/03/2023   HCT 34.9 (L) 08/03/2023   MCV 97.5 08/03/2023   PLT 275 08/03/2023   Recent Labs    07/22/23 0616 07/23/23 0800 07/25/23 0550 07/26/23 0558 07/27/23 0437 08/04/23 0443 08/05/23 0506 09/08/23 1424  NA 137   < > 132* 132*   < > 130* 133* 139  K 4.2   < > 5.3* 5.1   < > 3.2* 3.2* 3.9  CL 106   < > 100 98   < > 96* 96* 110  CO2 24   < > 26 28   < > 27 28 24   GLUCOSE 138*   < > 107* 81   < > 93 101* 94  BUN 28*   < > 27* 26*   < > 21*  20 10  CREATININE 0.80   < > 0.80 0.90   < > 0.90 0.82 0.80  CALCIUM 8.3*   < > 8.5* 8.7*   < > 8.9 9.0 9.0  GFRNONAA >60   < > >60 >60   < > >60 >60 >60  PROT 5.2*  --  5.5* 6.0*  --   --   --   --   ALBUMIN  1.8*  --  1.6* 1.7*  --   --   --   --   AST 29  --  45* 73*  --   --    --   --   ALT 16  --  26 33  --   --   --   --   ALKPHOS 80  --  73 75  --   --   --   --   BILITOT 1.4*  --  1.6* 2.1*  --   --   --   --   BILIDIR  --   --   --  0.9*  --   --   --   --    < > = values in this interval not displayed.    RADIOGRAPHIC STUDIES: I have personally reviewed the radiological images as listed and agreed with the findings in the report. CT CHEST HIGH RESOLUTION Result Date: 09/20/2023 CLINICAL DATA:  Shortness of breath. EXAM: CT CHEST WITHOUT CONTRAST TECHNIQUE: Multidetector CT imaging of the chest was performed following the standard protocol without intravenous contrast. High resolution imaging of the lungs, as well as inspiratory and expiratory imaging, was performed. RADIATION DOSE REDUCTION: This exam was performed according to the departmental dose-optimization program which includes automated exposure control, adjustment of the mA and/or kV according to patient size and/or use of iterative reconstruction technique. COMPARISON:  07/26/2023. FINDINGS: Cardiovascular: Heart is enlarged.  No pericardial effusion. Mediastinum/Nodes: No pathologically enlarged mediastinal or axillary lymph nodes. Hilar regions are difficult to definitively evaluate without IV contrast. Esophagus is grossly unremarkable. Lungs/Pleura: Improving peribronchovascular nodularity and consolidation with residual coarsened ground-glass and architectural distortion. No subpleural reticulation, traction bronchiectasis/bronchiolectasis or honeycombing. No pleural fluid. Airway is unremarkable. No air trapping. Upper Abdomen: Liver is slightly decreased in attenuation diffusely. Visualized portions of the liver, adrenal glands, left kidney, spleen, pancreas, stomach and bowel are otherwise grossly unremarkable. No upper abdominal adenopathy. Musculoskeletal: Degenerative changes in the spine. IMPRESSION: 1. Fairly diffuse peribronchovascular coarsened ground-glass and architectural distortion, findings  indicative of evolving postinfectious/postinflammatory scarring and/or fibrosis. Findings are suggestive of an alternative diagnosis (not UIP) per consensus guidelines: Diagnosis of Idiopathic Pulmonary Fibrosis: An Official ATS/ERS/JRS/ALAT Clinical Practice Guideline. Am Annie Barton Crit Care Med Vol 198, Iss 5, [phone], Dec 26 2016. 2. Hepatic steatosis. Electronically Signed   By: Shearon Denis M.D.   On: 09/20/2023 15:25   US  Venous Img Upper Uni Right(DVT) Result Date: 09/02/2023 CLINICAL DATA:  Right upper extremity swelling EXAM: RIGHT UPPER EXTREMITY VENOUS DOPPLER ULTRASOUND TECHNIQUE: Gray-scale sonography with graded compression, as well as color Doppler and duplex ultrasound were performed to evaluate the upper extremity deep venous system from the level of the subclavian vein and including the jugular, axillary, basilic, radial, ulnar and upper cephalic vein. Spectral Doppler was utilized to evaluate flow at rest and with distal augmentation maneuvers. COMPARISON:  None Available. FINDINGS: Contralateral Subclavian Vein: Respiratory phasicity is normal and symmetric with the symptomatic side. No evidence of thrombus. Normal compressibility. Internal Jugular Vein: No evidence of thrombus. Normal compressibility,  respiratory phasicity and response to augmentation. Subclavian Vein: There is smoothly marginated wall thickening in the right subclavian vein favored to represent sequelae of nonocclusive thrombus. Axillary Vein: The right axillary vein is noncompressible. Cephalic Vein: Patent. Basilic Vein: The basilic vein is compressible. Brachial Veins: There are 2 brachial veins. One is patent and 1 is occluded. Radial Veins: Patent. Ulnar Veins: Patent Venous Reflux:  None visualized. Other Findings:  None visualized. IMPRESSION: 1. 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 2. Occlusive thrombus within the right axillary vein. 3. Occlusive thrombus within 1 of 2  branched brachial veins. These results will be called to the ordering clinician or representative by the Radiologist Assistant, and communication documented in the PACS or [employer]. Electronically Signed   By: Reagan Camera M.D.   On: 09/02/2023 15:20     Arm DVT (deep venous thromboembolism), acute, right (HCC) # MARCH 30th, 2025- [facility] line] PROVOKED- Occlusive DVT throughout the right basilic, axillary, and subclavian veins-currently on Eliquis .  Significant improvement noted from baseline as per family.  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   # I reviewed the recent ultrasound-which some improvement but not complete resolution.  Clinically, patient noted to have improvement but not back to baseline.  For now I recommend continued Eliquis .  For the next 2 months or so.  Also also make a referral to vascular for further recommendations.  # Headaches- ? Etiology [since end of march 2025]-delirium x1-word finding difficulty- over all improved- but  no other neurologic deficts-recommend stat head CT scan.  # Shortness of breath/bilateral lower extremity swelling-defer to cardiology./Pulmonary.  mychart # DISPOSITION: # STAT BRAIN CT- DRI # refer to vascular re: right Upper extremity DVT-   # follow up 2 month- MD: labs- cbc/cmp-Dr.B   The above plan of care was discussed with patient/family in detail.  My contact information was given to the patient/family.     Gwyn Leos, MD 09/28/2023 4:39 PM

## 2023-09-28 NOTE — Assessment & Plan Note (Addendum)
#   MARCH 30th, 2025- [facility] line] PROVOKED- Occlusive DVT throughout the right basilic, axillary, and subclavian veins-currently on Eliquis .  Significant improvement noted from baseline as per family.  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   # I reviewed the recent ultrasound-which some improvement but not complete resolution.  Clinically, patient noted to have improvement but not back to baseline.  For now I recommend continued Eliquis .  For the next 2 months or so.  Also also make a referral to vascular for further recommendations.  # Headaches- ? Etiology [since end of march 2025]-delirium x1-word finding difficulty- over all improved- but  no other neurologic deficts-recommend stat head CT scan.  # Shortness of breath/bilateral lower extremity swelling-defer to cardiology./Pulmonary.  mychart # DISPOSITION: # STAT BRAIN CT- DRI # refer to vascular re: right Upper extremity DVT-   # follow up 2 month- MD: labs- cbc/cmp-Dr.B  Addendum: I spoke to patient regarding results of CT scan negative for any bleeding.  Continue Tylenol  as needed.  Defer to PCP for further workup of headaches.

## 2023-09-28 NOTE — Progress Notes (Signed)
 C/o left thigh numbness since surgery, getting worse. Fell 2 weeks ago, bruised lt knee.  [facility] said no to a hernia surgery.  Being treated for edma in legs by PCP. Dx with hyperthyroid, low vit D, referred to endocrinology, no appt yet. Seeing cardiology Dr. Charlanne Cong 09/30/23 for edma and enlarged heart, and GI  11/04/23. Appt with pulmonolgy Dr. Darnelle Elders 10/27/23 for SOB with activity.   Pt is on some new meds, list updated.  Appetite 30% normal, some nausea/vomiting. No supplement drinks.  C/o hand tremors and trouble with words and memory.  Daughter said one day he was acting like he was back in the hospital and talking about things like, "hospital based delirium". He thought he was in the hospital for 40 minutes.  C/o headache since being in hospital 4/10. At bedtime pain is 7/10.

## 2023-09-29 ENCOUNTER — Ambulatory Visit
Admission: RE | Admit: 2023-09-29 | Discharge: 2023-09-29 | Disposition: A | Discharge status: Home / Self Care | Payer: MEDICAID | Source: Ambulatory Visit | Attending: Internal Medicine | Admitting: Internal Medicine

## 2023-09-29 ENCOUNTER — Telehealth: Payer: Self-pay | Admitting: Internal Medicine

## 2023-09-29 DIAGNOSIS — R519 Headache, unspecified: Secondary | ICD-10-CM | POA: Insufficient documentation

## 2023-09-29 NOTE — Telephone Encounter (Signed)
 I spoke to patient regarding results of CT scan negative for any bleeding.  Continue Tylenol  as needed.  Defer to PCP for further workup of headaches.  FYI-

## 2023-09-30 ENCOUNTER — Ambulatory Visit: Payer: MEDICAID | Attending: Cardiology | Admitting: Cardiology

## 2023-09-30 ENCOUNTER — Encounter: Payer: Self-pay | Admitting: Cardiology

## 2023-09-30 ENCOUNTER — Telehealth: Payer: Self-pay | Admitting: Pharmacy Technician

## 2023-09-30 ENCOUNTER — Telehealth: Payer: Self-pay | Admitting: Cardiology

## 2023-09-30 ENCOUNTER — Other Ambulatory Visit: Payer: Self-pay

## 2023-09-30 ENCOUNTER — Other Ambulatory Visit (HOSPITAL_COMMUNITY): Payer: Self-pay

## 2023-09-30 VITALS — BP 138/80 | HR 84 | Ht 73.0 in | Wt 364.1 lb

## 2023-09-30 DIAGNOSIS — R0602 Shortness of breath: Secondary | ICD-10-CM | POA: Insufficient documentation

## 2023-09-30 DIAGNOSIS — R600 Localized edema: Secondary | ICD-10-CM | POA: Diagnosis present

## 2023-09-30 DIAGNOSIS — G4733 Obstructive sleep apnea (adult) (pediatric): Secondary | ICD-10-CM | POA: Insufficient documentation

## 2023-09-30 MED ORDER — TORSEMIDE 20 MG PO TABS: 20.0000 mg | ORAL_TABLET | Freq: Two times a day (BID) | ORAL | 3 refills | Status: DC

## 2023-09-30 MED ORDER — POTASSIUM CHLORIDE ER 10 MEQ PO CPCR: 10.0000 meq | ORAL_CAPSULE | Freq: Every day | ORAL | 3 refills | Status: DC

## 2023-09-30 MED ORDER — TORSEMIDE 40 MG PO TABS: 20.0000 mg | ORAL_TABLET | Freq: Every day | ORAL | 3 refills | Status: DC

## 2023-09-30 NOTE — Progress Notes (Signed)
 Cardiology Office Note:    Date:  09/30/2023   ID:  [name], DOB [birth year], MRN [id number]  PCP:  Annette Barters, MD   [facility] Providers Cardiologist:  None     Referring MD: Annette Barters, MD   Chief Complaint  Patient presents with   New Patient (Initial Visit)    C/o Chest tightness/heaviness, headache, SOB and edema. Meds reviewed verbally with pt.   [name] is a 58 y.o. male who is being seen today for the evaluation of shortness of breath at the request of Hamrick, Orest Bio, MD.   History of Present Illness:    [name] is a 58 y.o. 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  Patient was admitted to the hospital 3 months ago 06/2023 for an inguinal hernia repair.  Hospital course complicated by bleeding, requiring transfusions and emergent abdominal surgery.  Developed a right upper extremity DVT due to PICC line placement.  Currently on Eliquis  for this.  Echo obtained 06/2023 showed normal systolic and diastolic function.  He endorsed shortness of breath with minimal activity, occasional chest pressure, and leg edema which gets worse as the day progresses.  Started on Lasix  20 mg daily, increase last week to 30 mg daily.  He states adequate diuresis but has gained at least 5 pounds over the past week.  High-resolution chest CT obtained last month showed pulmonary scarring.  States that being diagnosed with sleep apnea years ago, did not tolerate nose mask.  Plans to follow-up with pulmonary medicine/sleep specialist for inadequate facemask.  Past Medical History:  Diagnosis Date   Asthma    as a child   Bronchitis 03/2016   pt has recovered from bronchitis   Chickenpox    Depression    Frequent headaches    GERD (gastroesophageal reflux disease)    when I was younger, better now.   Hypertension     Migraines    history of, not now   OSA (obstructive sleep apnea)     Past Surgical History:  Procedure Laterality Date   APPENDECTOMY  1977   HERNIA REPAIR     HYDROCELE EXCISION Right 06/01/2016   Procedure: HYDROCELECTOMY ADULT;  Surgeon: Dustin Gimenez, MD;  Location: [facility];  Service: Urology;  Laterality: Right;   knee Left 1994   KNEE ARTHROSCOPY Right 1992   LAPAROTOMY N/A 07/13/2023   Procedure: LAPAROTOMY, EXPLORATORY;  Surgeon: Conrado Delay, DO;  Location: [facility];  Service: General;  Laterality: N/A;  Pink pad for case is needed possilble open   LAPAROTOMY N/A 07/17/2023   Procedure: LAPAROTOMY, EXPLORATORY, ABDOMINAL WASHOUT WITH CLOSURE;  Surgeon: Emmalene Hare, MD;  Location: [facility];  Service: General;  Laterality: N/A;   UMBILICAL HERNIA REPAIR  2013   VASECTOMY N/A 06/01/2016   Procedure: VASECTOMY;  Surgeon: Dustin Gimenez, MD;  Location: [facility];  Service: Urology;  Laterality: N/A;    Current Medications: Current Meds  Medication Sig   acetaminophen  (TYLENOL ) 325 MG tablet Take 2 tablets (650 mg total) by mouth every 6 (six) hours as needed for mild pain (pain score 1-3), fever or headache.   apixaban  (ELIQUIS ) 5 MG TABS tablet Take 1 tablet (5 mg total) by mouth 2 (two) times daily.   cephALEXin  (KEFLEX ) 500 MG capsule Take 500 mg by mouth 3 (three) times daily.  Cholecalciferol (D 1000) 25 MCG (1000 UT) capsule Take 5,000 Units by mouth daily.   omeprazole (PRILOSEC) 40 MG capsule Take 40 mg by mouth daily.   torsemide 40 MG TABS Take 20 mg by mouth daily.   VENTOLIN  HFA 108 (90 Base) MCG/ACT inhaler Inhale 1 puff into the lungs every 4 (four) hours as needed.   [DISCONTINUED] furosemide  (LASIX ) 20 MG tablet Take 30 mg by mouth daily.   [DISCONTINUED] potassium chloride  (MICRO-K ) 10 MEQ CR capsule Take 10 mEq by mouth every other day.     Allergies:   Sulfa antibiotics and Whole blood   Social History   Socioeconomic History   Marital status: Widowed     Spouse name: Not on file   Number of children: Not on file   Years of education: Not on file   Highest education level: Not on file  Occupational History   Not on file  Tobacco Use   Smoking status: Former    Current packs/day: 0.50    Types: Cigarettes   Smokeless tobacco: Never   Tobacco comments:    March 11 2016  Vaping Use   Vaping status: Never Used  Substance and Sexual Activity   Alcohol use: Not Currently    Alcohol/week: 4.0 - 6.0 standard drinks of alcohol    Types: 4 - 6 Shots of liquor per week   Drug use: No   Sexual activity: Not on file  Other Topics Concern   Not on file  Social History Narrative   Single.   2 children.   Works independently as Building services engineer.   Enjoys reading, watching tv.   Social Drivers of Health   Financial Resource Strain: Medium Risk (07/07/2023)   Received from [facility]   Overall Financial Resource Strain (CARDIA)    Difficulty of Paying Living Expenses: Somewhat hard  Food Insecurity: No Food Insecurity (07/14/2023)   Hunger Vital Sign    Worried About Running Out of Food in the Last Year: Never true    Ran Out of Food in the Last Year: Never true  Recent Concern: Food Insecurity - Food Insecurity Present (07/07/2023)   Received from [facility]   Hunger Vital Sign    Worried About Running Out of Food in the Last Year: Sometimes true    Ran Out of Food in the Last Year: Sometimes true  Transportation Needs: No Transportation Needs (07/14/2023)   PRAPARE - Administrator, Civil Service (Medical): No    Lack of Transportation (Non-Medical): No  Recent Concern: Transportation Needs - Unmet Transportation Needs (07/07/2023)   Received from [facility] - Transportation    In the past 12 months, has lack of transportation kept you from medical appointments or from getting medications?: Yes    Lack of Transportation (Non-Medical): Yes  Physical Activity: Not on  file  Stress: Not on file  Social Connections: Not on file     Family History: The patient's family history includes Alcohol abuse in his father; Arthritis in his maternal grandmother; Breast cancer in his maternal grandmother; Colon cancer in his maternal grandmother; Heart attack in his paternal grandmother; Hyperlipidemia in his father, maternal grandfather, paternal grandfather, and paternal grandmother; Hypertension in his father, maternal grandfather, and paternal grandfather; Ovarian cancer in his maternal grandmother and mother; Prostate cancer in his maternal uncle. There is no history of Bladder Cancer or Kidney disease.  ROS:   Please see the history of  present illness.     All other systems reviewed and are negative.  EKGs/Labs/Other Studies Reviewed:    The following studies were reviewed today:  EKG Interpretation Date/Time:  Thursday September 30 2023 10:21:17 EDT Ventricular Rate:  84 PR Interval:  146 QRS Duration:  86 QT Interval:  392 QTC Calculation: 463 R Axis:   43  Text Interpretation: Normal sinus rhythm Normal ECG Confirmed by Constancia Delton ([id number]) on 09/30/2023 10:23:48 AM    Recent Labs: 07/26/2023: ALT 33 08/03/2023: Hemoglobin 12.2; Platelets 275 08/05/2023: Magnesium  2.0 09/08/2023: B Natriuretic Peptide 73.9; BUN 10; Creatinine, Ser 0.80; Potassium 3.9; Sodium 139  Recent Lipid Panel    Component Value Date/Time   CHOL 130 04/08/2016 0814   TRIG 64 07/26/2023 0558   HDL 43.70 04/08/2016 0814   CHOLHDL 3 04/08/2016 0814   VLDL 15.8 04/08/2016 0814   LDLCALC 71 04/08/2016 0814     Risk Assessment/Calculations:             Physical Exam:    VS:  BP 138/80 (BP Location: Left Arm, Patient Position: Sitting, Cuff Size: Large)   Pulse 84   Ht 6\' 1"  (1.854 m)   Wt (!) 364 lb 2 oz (165.2 kg)   SpO2 98%   BMI 48.04 kg/m     Wt Readings from Last 3 Encounters:  09/30/23 (!) 364 lb 2 oz (165.2 kg)  09/28/23 (!) 360 lb 6.4 oz (163.5 kg)   09/08/23 (!) 357 lb 9.6 oz (162.2 kg)     GEN:  Well nourished, well developed in no acute distress HEENT: Normal NECK: No JVD; No carotid bruits CARDIAC: RRR, no murmurs, rubs, gallops RESPIRATORY:  Clear to auscultation without rales, wheezing or rhonchi  ABDOMEN: Soft, non-tender, non-distended MUSCULOSKELETAL:  1+ edema; No deformity  SKIN: Warm and dry NEUROLOGIC:  Alert and oriented x 3 PSYCHIATRIC:  Normal affect   ASSESSMENT:    1. SOB (shortness of breath)   2. Bilateral leg edema   3. Morbid obesity (HCC)   4. OSA (obstructive sleep apnea)    PLAN:    In order of problems listed above:  Shortness of breath, normal EF 65%, diastolic function normal.  Etiology multifactorial including morbid obesity, untreated sleep apnea, pulmonary scarring.  Does not appear to be cardiac.  Not a candidate for any noninvasive testing due to morbid obesity.  If he develops anginal symptoms, will consider cardiac cath although his symptoms again appear noncardiac. Bilateral leg edema, likely from morbid obesity.  Stop Lasix , start torsemide 40 mg daily, KCl 10 mEq daily.  Check BMP in 10 days. Morbid obesity, low-calorie diet advised. Untreated OSA, advised to follow-up with pulm/sleep specialist for adequate management.  Follow-up in 6 weeks.      Medication Adjustments/Labs and Tests Ordered: Current medicines are reviewed at length with the patient today.  Concerns regarding medicines are outlined above.  Orders Placed This Encounter  Procedures   Basic Metabolic Panel (BMET)   EKG 12-Lead   Meds ordered this encounter  Medications   potassium chloride  (MICRO-K ) 10 MEQ CR capsule    Sig: Take 1 capsule (10 mEq total) by mouth daily.    Dispense:  30 capsule    Refill:  3   torsemide 40 MG TABS    Sig: Take 20 mg by mouth daily.    Dispense:  45 tablet    Refill:  3    Patient Instructions  Medication Instructions:  Your physician recommends the following medication  changes.  STOP TAKING: Furosemide    START TAKING: Torsemide 40 mg daily   INCREASE: Potassium chloride  supplement from every other day to every day   *If you need a refill on your cardiac medications before your next appointment, please call your pharmacy*  Lab Work: Your provider would like for you to return in 10 days to have the following labs drawn: BMP.   Please go to [facility] [address] ([facility] [address], [place] [address] You do not need an appointment.  They are open from 8 am- 4:30 pm.  Lunch from 1:00 pm- 2:00 pm You do not need to be fasting.  If you have labs (blood work) drawn today and your tests are completely normal, you will receive your results only by: MyChart Message (if you have MyChart) OR A paper copy in the mail If you have any lab test that is abnormal or we need to change your treatment, we will call you to review the results.  Testing/Procedures: No test ordered today   Follow-Up: At [facility], you and your health needs are our priority.  As part of our continuing mission to provide you with exceptional heart care, our providers are all part of one team.  This team includes your primary Cardiologist (physician) and Advanced Practice Providers or APPs (Physician Assistants and Nurse Practitioners) who all work together to provide you with the care you need, when you need it.  Your next appointment:   6 week(s)  Provider:   You may see Dr. Junnie Olives or one of the following Advanced Practice Providers on your designated Care Team:   Laneta Pintos, NP Gildardo Labrador, PA-C Varney Gentleman, PA-C Cadence [place], PA-C Ronald Cockayne, NP Morey Ar, NP    We recommend signing up for the patient portal called "MyChart".  Sign up information is provided on this After Visit Summary.  MyChart is used to connect with patients for Virtual Visits (Telemedicine).  Patients are able to view lab/test results,  encounter notes, upcoming appointments, etc.  Non-urgent messages can be sent to your provider as well.   To learn more about what you can do with MyChart, go to [email or web address].    Signed, Constancia Delton, MD  09/30/2023 12:05 PM    [facility]

## 2023-09-30 NOTE — Telephone Encounter (Signed)
 Pharmacy Patient Advocate Encounter   Received notification from Pt Calls Messages that prior authorization for Soaanz 40 MG  is required/requested.   Insurance verification completed.   The patient is insured through [facility] .   Per test claim:  Torsemide 20MG  is preferred by the insurance.  If suggested medication is appropriate, Please send in a new RX and discontinue this one. If not, please advise as to why it's not appropriate so that we may request a Prior Authorization. Please note, some preferred medications may still require a PA.  If the suggested medications have not been trialed and there are no contraindications to their use, the PA will not be submitted, as it will not be approved.

## 2023-09-30 NOTE — Telephone Encounter (Signed)
 Pt c/o medication issue:  1. Name of Medication:   torsemide 40 MG TABS    2. How are you currently taking this medication (dosage and times per day)? As written   3. Are you having a reaction (difficulty breathing--STAT)? No   4. What is your medication issue? Pt called in stating his pharmacy told him they cannot fill until this is approved by insurance. Please advise.

## 2023-09-30 NOTE — Patient Instructions (Signed)
 Medication Instructions:  Your physician recommends the following medication changes.  STOP TAKING: Furosemide    START TAKING: Torsemide 40 mg daily   INCREASE: Potassium chloride  supplement from every other day to every day   *If you need a refill on your cardiac medications before your next appointment, please call your pharmacy*  Lab Work: Your provider would like for you to return in 10 days to have the following labs drawn: BMP.   Please go to [facility] [address] ([facility] [address], [place] [address] You do not need an appointment.  They are open from 8 am- 4:30 pm.  Lunch from 1:00 pm- 2:00 pm You do not need to be fasting.  If you have labs (blood work) drawn today and your tests are completely normal, you will receive your results only by: MyChart Message (if you have MyChart) OR A paper copy in the mail If you have any lab test that is abnormal or we need to change your treatment, we will call you to review the results.  Testing/Procedures: No test ordered today   Follow-Up: At [facility], you and your health needs are our priority.  As part of our continuing mission to provide you with exceptional heart care, our providers are all part of one team.  This team includes your primary Cardiologist (physician) and Advanced Practice Providers or APPs (Physician Assistants and Nurse Practitioners) who all work together to provide you with the care you need, when you need it.  Your next appointment:   6 week(s)  Provider:   You may see Dr. Junnie Olives or one of the following Advanced Practice Providers on your designated Care Team:   Laneta Pintos, NP Gildardo Labrador, PA-C Varney Gentleman, PA-C Cadence [place], PA-C Ronald Cockayne, NP Morey Ar, NP    We recommend signing up for the patient portal called "MyChart".  Sign up information is provided on this After Visit Summary.  MyChart is used to connect with patients for  Virtual Visits (Telemedicine).  Patients are able to view lab/test results, encounter notes, upcoming appointments, etc.  Non-urgent messages can be sent to your provider as well.   To learn more about what you can do with MyChart, go to [email or web address].

## 2023-10-01 ENCOUNTER — Encounter: Payer: Self-pay | Admitting: Student in an Organized Health Care Education/Training Program

## 2023-10-04 NOTE — Telephone Encounter (Signed)
 You saw him last on 5/14, but not for OSA.

## 2023-10-18 ENCOUNTER — Other Ambulatory Visit: Payer: Self-pay

## 2023-10-18 DIAGNOSIS — R600 Localized edema: Secondary | ICD-10-CM

## 2023-10-18 DIAGNOSIS — R0602 Shortness of breath: Secondary | ICD-10-CM

## 2023-10-19 LAB — BASIC METABOLIC PANEL WITH GFR
BUN/Creatinine Ratio: 10 (ref 9–20)
BUN: 9 mg/dL (ref 6–24)
CO2: 20 mmol/L (ref 20–29)
Calcium: 9 mg/dL (ref 8.7–10.2)
Chloride: 107 mmol/L — ABNORMAL HIGH (ref 96–106)
Creatinine, Ser: 0.91 mg/dL (ref 0.76–1.27)
Glucose: 80 mg/dL (ref 70–99)
Potassium: 4.2 mmol/L (ref 3.5–5.2)
Sodium: 144 mmol/L (ref 134–144)
eGFR: 100 mL/min/{1.73_m2} (ref >59)

## 2023-10-24 ENCOUNTER — Ambulatory Visit: Payer: Self-pay | Admitting: Cardiology

## 2023-10-26 ENCOUNTER — Ambulatory Visit
Admission: RE | Admit: 2023-10-26 | Discharge: 2023-10-26 | Disposition: A | Discharge status: Home / Self Care | Payer: MEDICAID | Source: Ambulatory Visit | Attending: Student in an Organized Health Care Education/Training Program | Admitting: Student in an Organized Health Care Education/Training Program

## 2023-10-26 DIAGNOSIS — R0600 Dyspnea, unspecified: Secondary | ICD-10-CM | POA: Diagnosis present

## 2023-10-26 DIAGNOSIS — R0602 Shortness of breath: Secondary | ICD-10-CM | POA: Diagnosis not present

## 2023-10-26 DIAGNOSIS — I081 Rheumatic disorders of both mitral and tricuspid valves: Secondary | ICD-10-CM | POA: Diagnosis not present

## 2023-10-26 LAB — ECHOCARDIOGRAM COMPLETE
AR max vel: 2.64 cm2
AV Area VTI: 2.63 cm2
AV Area mean vel: 2.6 cm2
AV Mean grad: 6 mmHg
AV Peak grad: 11.6 mmHg
Ao pk vel: 1.7 m/s
Area-P 1/2: 3.27 cm2
Calc EF: 52.8 %
MV VTI: 3.25 cm2
S' Lateral: 3.45 cm
Single Plane A2C EF: 44.6 %
Single Plane A4C EF: 60.5 %

## 2023-10-26 MED ORDER — PERFLUTREN LIPID MICROSPHERE
1.0000 mL | INTRAVENOUS | Status: AC | PRN
  Administered 2023-10-26: 2 mL via INTRAVENOUS

## 2023-10-27 ENCOUNTER — Encounter: Payer: Self-pay | Admitting: Student in an Organized Health Care Education/Training Program

## 2023-10-27 ENCOUNTER — Ambulatory Visit (INDEPENDENT_AMBULATORY_CARE_PROVIDER_SITE_OTHER): Payer: MEDICAID | Admitting: Student in an Organized Health Care Education/Training Program

## 2023-10-27 ENCOUNTER — Ambulatory Visit: Payer: MEDICAID | Admitting: Student in an Organized Health Care Education/Training Program

## 2023-10-27 VITALS — BP 148/86 | HR 77 | Temp 97.1°F | Ht 74.0 in | Wt 361.6 lb

## 2023-10-27 DIAGNOSIS — Z6841 Body Mass Index (BMI) 40.0 and over, adult: Secondary | ICD-10-CM

## 2023-10-27 DIAGNOSIS — E66813 Obesity, class 3: Secondary | ICD-10-CM | POA: Diagnosis not present

## 2023-10-27 DIAGNOSIS — R0602 Shortness of breath: Secondary | ICD-10-CM

## 2023-10-27 DIAGNOSIS — J4540 Moderate persistent asthma, uncomplicated: Secondary | ICD-10-CM | POA: Diagnosis not present

## 2023-10-27 DIAGNOSIS — G4733 Obstructive sleep apnea (adult) (pediatric): Secondary | ICD-10-CM

## 2023-10-27 DIAGNOSIS — Z87891 Personal history of nicotine dependence: Secondary | ICD-10-CM

## 2023-10-27 LAB — PULMONARY FUNCTION TEST
DL/VA % pred: 99 %
DL/VA: 4.22 ml/min/mmHg/L
DLCO unc % pred: 47 %
DLCO unc: 15.25 ml/min/mmHg
FEF 25-75 Post: 1.92 L/s
FEF 25-75 Pre: 1.64 L/s
FEF2575-%Change-Post: 17 %
FEF2575-%Pred-Post: 53 %
FEF2575-%Pred-Pre: 45 %
FEV1-%Change-Post: 5 %
FEV1-%Pred-Post: 67 %
FEV1-%Pred-Pre: 63 %
FEV1-Post: 2.88 L
FEV1-Pre: 2.74 L
FEV1FVC-%Change-Post: 4 %
FEV1FVC-%Pred-Pre: 87 %
FEV6-%Change-Post: 1 %
FEV6-%Pred-Post: 75 %
FEV6-%Pred-Pre: 74 %
FEV6-Post: 4.07 L
FEV6-Pre: 4.02 L
FEV6FVC-%Change-Post: 0 %
FEV6FVC-%Pred-Post: 103 %
FEV6FVC-%Pred-Pre: 102 %
FVC-%Change-Post: 0 %
FVC-%Pred-Post: 72 %
FVC-%Pred-Pre: 72 %
FVC-Post: 4.1 L
FVC-Pre: 4.07 L
Post FEV1/FVC ratio: 70 %
Post FEV6/FVC ratio: 99 %
Pre FEV1/FVC ratio: 67 %
Pre FEV6/FVC Ratio: 99 %
RV % pred: 120 %
RV: 2.84 L
TLC % pred: 84 %
TLC: 6.58 L

## 2023-10-27 NOTE — Patient Instructions (Signed)
 Full PFT completed today ? ?

## 2023-10-27 NOTE — Progress Notes (Signed)
 Assessment & Plan:   #Shortness of breath #History of Asthma #Defect in diffusion capacity  He is a pleasant 58 year old male with a history of childhood onset asthma and recent hospitalization with respiratory failure secondary to a blood transfusion reaction and possible hospital-acquired pneumonia.  He is presenting to clinic for the evaluation of shortness of breath.  Since our last visit, he had his pulmonary function testing which I have personally reviewed.  The study is notable for significant drop in his diffusion capacity that is out of proportion to his lung volumes (which are normal).  His ERV is significantly reduced which is most likely secondary to his obesity.  Spirometry shows a ratio of 0.7 postbronchodilator challenge with mild obstruction. This could represent a component of COPD or COPD/Asthma overlap, but the fact that his symptoms are much improved without inhalers and with diuresis point away from this etiology. Furthermore, he had a high-resolution chest CT performed in May 2025 which was showing improved infiltrates when compared to prior (possible postinflammatory scarring). Finally, he carries a history of OSA for which he was previously prescribed CPAP machine.  Overall, his shortness of breath multifactorial is best explained by the drop in his DLCO, with a component of mild and non-reversible obstruction on PFT's. Between decreased diffusion capacity and mild RV dilation/thickening noted on echocardiogram, I am highly concerned for the development of pulmonary hypertension.  This could be secondary to HFpEF as well as untreated OSA.  He remains on torsemide  and I would recommend proceeding with right heart catheterization to further evaluate his filling pressures and assess for pre versus postcapillary pulmonary hypertension.  - Recommend right heart cath - Continue with diuresis - Treatment of OSA as below - will consider long acting inhaler therapy on follow  up  #OSA  He carries a history of OSA previously prescribed a CPAP machine.  He has not been using this.  We will resend his supplies I have encouraged compliance.  I will set his CPAP machine at 5-20 cm H2O and will review compliance report on follow-up.  Should he have persistent apneas, will consider split-night sleep study.   Return in about 3 months (around 01/27/2024).  I spent 30 minutes caring for this patient today, including preparing to see the patient, obtaining a medical history , reviewing a separately obtained history, performing a medically appropriate examination and/or evaluation, counseling and educating the patient/family/caregiver, ordering medications, tests, or procedures, documenting clinical information in the electronic health record, and independently interpreting results (not separately reported/billed) and communicating results to the patient/family/caregiver  [name], MD [facility]  End of visit medications:  No orders of the defined types were placed in this encounter.    Current Outpatient Medications:    acetaminophen  (TYLENOL ) 325 MG tablet, Take 2 tablets (650 mg total) by mouth every 6 (six) hours as needed for mild pain (pain score 1-3), fever or headache., Disp: , Rfl:    apixaban  (ELIQUIS ) 5 MG TABS tablet, Take 1 tablet (5 mg total) by mouth 2 (two) times daily., Disp: 60 tablet, Rfl: 0   Cholecalciferol (D 1000) 25 MCG (1000 UT) capsule, Take 5,000 Units by mouth daily., Disp: , Rfl:    omeprazole (PRILOSEC) 40 MG capsule, Take 40 mg by mouth daily., Disp: , Rfl:    potassium chloride  (MICRO-K ) 10 MEQ CR capsule, Take 1 capsule (10 mEq total) by mouth daily., Disp: 30 capsule, Rfl: 3   torsemide  (DEMADEX ) 20 MG tablet, Take 1 tablet (  20 mg total) by mouth 2 (two) times daily., Disp: 60 tablet, Rfl: 3   VENTOLIN  HFA 108 (90 Base) MCG/ACT inhaler, Inhale 1 puff into the lungs every 4 (four) hours as needed., Disp: , Rfl:     Subjective:   PATIENT ID: [name] GENDER: male DOB: 09-26-1965, MRN: [id number]  Chief Complaint  Patient presents with   Follow-up    DOE. No wheezing or cough.     HPI  Patient is a pleasant 58 year old male presenting for follow up.   Patient was admitted to [facility] on 07/13/2023 for an inguinal hernia repair that was complicated by bleed and postoperative hypoxic respiratory failure requiring ICU admission.  Course was complicated by likely blood transfusion related reaction as well as likely hospital-acquired pneumonia. These were treated with broad-spectrum antibiotics as well as diuretics.  He did have to be briefly intubated for respiratory failure in the setting of bowel leak triggered by dehiscence from significant cough. He was discharged on 08/05/2023. He was also found to have an upper extremity DVT in the setting of PICC line placement for which he was started on Apixaban  and followed by Dr. Rennie from hem/onc.   Following hospital discharge, he felt somewhat better but has had 2 episodes with increased shortness of breath and cough.  He was seen by his primary care physician and has received a course of antibiotics as well as 2 courses of prednisone  (40 mg for 5 days).  The prednisone  has helped with symptoms.  He has not had any fevers or chills, nor has he had any chest pain or chest tightness.  The cough has been present since discharge but was not present prior to this hospitalization.  He has had some weight gain as well as some increase in his lower extremity edema.  OV 10/27/2023: He feels much better compared to prior, and feels he is back to near baseline. Shortness of breath is improved, and he has no cough, no chest pain, and no chest tightness. He continues to attempt to loose weight. He is on torsemide . He was seen by cardiology.   Patient's medical history is notable for a diagnosis of asthma in his childhood that he thinks he grew out of.  He does tell me  that he had to be intubated in his early 30s after walking through a perfume store where he was exposed to scents.  He felt very short of breath and EMS were activated and he was intubated in the field.  He was also intubated secondary to electrocution.   Patient has a history of smoking, with around 40 to 50 pack years of smoking history.  He quit smoking in March 2025.  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  He was deployed in [employer] and does not recall burn pit exposure.  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 They have two dogs and a cat. He had a pet bird between 65 and 1994. No illicit drug use reported.  Ancillary information including prior medications, full medical/surgical/family/social histories, and PFTs (when available) are listed below and have been reviewed.   Review of Systems  Constitutional:  Negative for chills, fever and weight loss.  Respiratory:  Positive for shortness of breath. Negative for cough, hemoptysis, sputum production and wheezing.   Cardiovascular:  Negative for chest pain.     Objective:   Vitals:   10/27/23 1052  BP: (!) 148/86  Pulse: 77  Temp: (!) 97.1 F (36.2 C)  SpO2: 97%  Weight: (!) 361 lb 9.6 oz (164 kg)  Height: 6' 2 (1.88 m)   97% on RA BMI Readings from Last 3 Encounters:  10/27/23 46.43 kg/m  10/27/23 46.43 kg/m  09/30/23 48.04 kg/m   Wt Readings from Last 3 Encounters:  10/27/23 (!) 361 lb 9.6 oz (164 kg)  10/27/23 (!) 361 lb 9.6 oz (164 kg)  09/30/23 (!) 364 lb 2 oz (165.2 kg)    Physical Exam Constitutional:      Appearance: He is obese.  Cardiovascular:     Rate and Rhythm: Normal rate and regular rhythm.     Pulses:  Normal pulses.     Heart sounds: Normal heart sounds.  Pulmonary:     Effort: Pulmonary effort is normal.     Breath sounds: Normal breath sounds. No wheezing.  Musculoskeletal:     Right lower leg: Edema present.     Left lower leg: Edema present.  Neurological:     General: No focal deficit present.     Mental Status: He is alert and oriented to person, place, and time. Mental status is at baseline.     Ancillary Information    Past Medical History:  Diagnosis Date   Asthma    as a child   Bronchitis 03/2016   pt has recovered from bronchitis   Chickenpox    Depression    Frequent headaches    GERD (gastroesophageal reflux disease)    when I was younger, better now.   Hypertension    Migraines    history of, not now   OSA (obstructive sleep apnea)      Family History  Problem Relation Age of Onset   Ovarian cancer Mother    Alcohol abuse Father    Hyperlipidemia Father    Hypertension Father    Prostate cancer Maternal Uncle    Arthritis Maternal Grandmother    Colon cancer Maternal Grandmother    Ovarian cancer Maternal Grandmother    Breast cancer Maternal Grandmother    Hyperlipidemia Maternal Grandfather    Hypertension Maternal Grandfather    Hyperlipidemia Paternal Grandmother    Heart attack Paternal Grandmother    Hyperlipidemia Paternal Grandfather    Hypertension Paternal Grandfather    Bladder Cancer Neg Hx    Kidney disease Neg Hx      Past Surgical History:  Procedure Laterality Date   APPENDECTOMY  1977   HERNIA REPAIR     HYDROCELE EXCISION Right 06/01/2016   Procedure: HYDROCELECTOMY ADULT;  Surgeon: Rosina Riis, MD;  Location: [facility];  Service: Urology;  Laterality: Right;   knee Left 1994   KNEE ARTHROSCOPY Right 1992   LAPAROTOMY N/A 07/13/2023   Procedure: LAPAROTOMY, EXPLORATORY;  Surgeon: Tye Millet, DO;  Location: [facility];  Service: General;  Laterality: N/A;  Pink pad for case is needed possilble open   LAPAROTOMY N/A  07/17/2023   Procedure: LAPAROTOMY, EXPLORATORY, ABDOMINAL WASHOUT WITH CLOSURE;  Surgeon: Desiderio Schanz, MD;  Location: [facility];  Service: General;  Laterality: N/A;   UMBILICAL HERNIA REPAIR  2013   VASECTOMY N/A 06/01/2016   Procedure: VASECTOMY;  Surgeon: Rosina Riis, MD;  Location: [facility];  Service: Urology;  Laterality: N/A;    Social History   Socioeconomic History   Marital status: Widowed    Spouse name: Not on file   Number of children: Not on file   Years of education: Not on file   Highest education level: Not on file  Occupational History   Not on file  Tobacco Use   Smoking status: Former    Current packs/day: 0.50    Types: Cigarettes   Smokeless tobacco: Never   Tobacco comments:    March 11 2016  Vaping Use   Vaping status: Never Used  Substance and Sexual Activity   Alcohol use: Not Currently    Alcohol/week: 4.0 - 6.0 standard drinks of alcohol    Types: 4 - 6 Shots of liquor per week   Drug use: No   Sexual activity: Not on file  Other Topics Concern   Not on file  Social History Narrative   Single.   2 children.   Works independently as Building services engineer.   Enjoys reading, watching tv.   Social Drivers of Corporate investment banker Strain: Low Risk  (10/18/2023)   Received from [facility]   Overall Financial Resource Strain (CARDIA)    Difficulty of Paying Living Expenses: Not hard at all  Food Insecurity: No Food Insecurity (10/18/2023)   Received from [facility]   Hunger Vital Sign    Within the past 12 months, you worried that your food would run out before you got the money to buy more.: Never true    Within the past 12 months, the food you bought just didn't last and you didn't have money to get more.: Never true  Transportation Needs: No Transportation Needs (10/18/2023)   Received from [facility] - Transportation    In the past 12 months, has lack of transportation kept you from  medical appointments or from getting medications?: No    Lack of Transportation (Non-Medical): No  Physical Activity: Not on file  Stress: Not on file  Social Connections: Not on file  Intimate Partner Violence: Not At Risk (07/14/2023)   Humiliation, Afraid, Rape, and Kick questionnaire    Fear of Current or Ex-Partner: No    Emotionally Abused: No    Physically Abused: No    Sexually Abused: No     Allergies  Allergen Reactions   Sulfa Antibiotics Anaphylaxis and Hives   Whole Blood Other (See Comments)    Recently had a blood transfusion that resulted in systemic sepsis and organ failure.     CBC    Component Value Date/Time   WBC 9.4 08/03/2023 0442   RBC 3.58 (L) 08/03/2023 0442   HGB 12.2 (L) 08/03/2023 0442   HGB 16.6 12/20/2013 1121   HCT 34.9 (L) 08/03/2023 0442   HCT 50.1 12/20/2013 1121   PLT 275 08/03/2023 0442   PLT 185 12/20/2013 1121   MCV 97.5 08/03/2023 0442   MCV 97 12/20/2013 1121   MCH 34.1 (H) 08/03/2023 0442   MCHC 35.0 08/03/2023 0442   RDW 13.7 08/03/2023 0442   RDW 13.3 12/20/2013 1121   LYMPHSABS 2.2 07/29/2023 0621   LYMPHSABS 1.7 05/03/2012 0407   MONOABS 0.8 07/29/2023 0621   MONOABS 0.9 05/03/2012 0407   EOSABS 0.4 07/29/2023 0621   EOSABS 0.1 05/03/2012 0407   BASOSABS 0.1 07/29/2023 0621   BASOSABS 0.0 05/03/2012 0407    Pulmonary Functions Testing Results:    Latest Ref Rng &  Units 10/27/2023    9:45 AM  PFT Results  FVC-Pre L 4.07  P  FVC-Predicted Pre % 72  P  FVC-Post L 4.10  P  FVC-Predicted Post % 72  P  Pre FEV1/FVC % % 67  P  Post FEV1/FCV % % 70  P  FEV1-Pre L 2.74  P  FEV1-Predicted Pre % 63  P  FEV1-Post L 2.88  P  DLCO uncorrected ml/min/mmHg 15.25  P  DLCO UNC% % 47  P  DLVA Predicted % 99  P  TLC L 6.58  P  TLC % Predicted % 84  P  RV % Predicted % 120  P    P Preliminary result    Outpatient Medications Prior to Visit  Medication Sig Dispense Refill   acetaminophen  (TYLENOL ) 325 MG tablet Take 2  tablets (650 mg total) by mouth every 6 (six) hours as needed for mild pain (pain score 1-3), fever or headache.     apixaban  (ELIQUIS ) 5 MG TABS tablet Take 1 tablet (5 mg total) by mouth 2 (two) times daily. 60 tablet 0   Cholecalciferol (D 1000) 25 MCG (1000 UT) capsule Take 5,000 Units by mouth daily.     omeprazole (PRILOSEC) 40 MG capsule Take 40 mg by mouth daily.     potassium chloride  (MICRO-K ) 10 MEQ CR capsule Take 1 capsule (10 mEq total) by mouth daily. 30 capsule 3   torsemide  (DEMADEX ) 20 MG tablet Take 1 tablet (20 mg total) by mouth 2 (two) times daily. 60 tablet 3   VENTOLIN  HFA 108 (90 Base) MCG/ACT inhaler Inhale 1 puff into the lungs every 4 (four) hours as needed.     cephALEXin  (KEFLEX ) 500 MG capsule Take 500 mg by mouth 3 (three) times daily.     No facility-administered medications prior to visit.

## 2023-10-27 NOTE — Progress Notes (Signed)
 Full PFT completed today ? ?

## 2023-11-02 ENCOUNTER — Ambulatory Visit: Payer: Self-pay | Admitting: Student in an Organized Health Care Education/Training Program

## 2023-11-04 ENCOUNTER — Ambulatory Visit: Payer: MEDICAID | Admitting: Gastroenterology

## 2023-11-10 ENCOUNTER — Other Ambulatory Visit (INDEPENDENT_AMBULATORY_CARE_PROVIDER_SITE_OTHER): Payer: Self-pay | Admitting: Nurse Practitioner

## 2023-11-10 DIAGNOSIS — I82621 Acute embolism and thrombosis of deep veins of right upper extremity: Secondary | ICD-10-CM

## 2023-11-11 ENCOUNTER — Ambulatory Visit: Payer: MEDICAID | Admitting: Cardiology

## 2023-11-12 ENCOUNTER — Ambulatory Visit: Payer: MEDICAID | Admitting: Cardiology

## 2023-11-15 ENCOUNTER — Encounter: Payer: Self-pay | Admitting: Neurology

## 2023-11-15 ENCOUNTER — Ambulatory Visit: Payer: MEDICAID | Admitting: Neurology

## 2023-11-15 ENCOUNTER — Telehealth: Payer: Self-pay | Admitting: Neurology

## 2023-11-15 NOTE — Telephone Encounter (Signed)
 Pt called to cancel appt due to Conflict    App rescheduled

## 2023-11-16 ENCOUNTER — Encounter (INDEPENDENT_AMBULATORY_CARE_PROVIDER_SITE_OTHER): Payer: Self-pay | Admitting: Vascular Surgery

## 2023-11-16 ENCOUNTER — Ambulatory Visit (INDEPENDENT_AMBULATORY_CARE_PROVIDER_SITE_OTHER): Payer: MEDICAID | Admitting: Vascular Surgery

## 2023-11-16 ENCOUNTER — Ambulatory Visit: Payer: MEDICAID | Admitting: Physician Assistant

## 2023-11-16 ENCOUNTER — Ambulatory Visit: Payer: MEDICAID

## 2023-11-16 ENCOUNTER — Ambulatory Visit (INDEPENDENT_AMBULATORY_CARE_PROVIDER_SITE_OTHER): Payer: MEDICAID

## 2023-11-16 VITALS — BP 138/81 | HR 76 | Ht 73.0 in | Wt 351.8 lb

## 2023-11-16 DIAGNOSIS — M7989 Other specified soft tissue disorders: Secondary | ICD-10-CM

## 2023-11-16 DIAGNOSIS — I872 Venous insufficiency (chronic) (peripheral): Secondary | ICD-10-CM | POA: Diagnosis not present

## 2023-11-16 DIAGNOSIS — I82621 Acute embolism and thrombosis of deep veins of right upper extremity: Secondary | ICD-10-CM | POA: Diagnosis not present

## 2023-11-16 DIAGNOSIS — I10 Essential (primary) hypertension: Secondary | ICD-10-CM

## 2023-11-16 NOTE — Progress Notes (Signed)
 Patient ID: [name], male   DOB: 1965-10-15, 58 y.o.   MRN: [id number]  Chief Complaint  Patient presents with   Establish Care    Urgent. np. UE DVT + consult. ACUTE DEEP VEIN THROMBOSIS, HE HAS MULTIPLE CLOTS AND HX OF CLOTS, SEEN AT [facility]  ref: Flinchum,Michelle      HPI [name] is a 58 y.o. male.  I am asked to see the patient by Michelle Flinchum for evaluation of RUE DVT.  The patient had a complicated hernia surgery 5 months ago and ultimately required a PICC line in the right upper extremity for extended antibiotics.  Within a couple of weeks of having the PICC line placed, he began having pain and swelling in the right arm and was found to have a fairly extensive right upper extremity DVT.  He was appropriately started on anticoagulation which she has now been on for about 4 months.  He has no current chest pain or shortness of breath beyond baseline.  He has noticed increasing varicosities of both lower extremities.  He also has chronic swelling of both lower extremities.  There is significant and marked stasis changes of both lower extremities.  A right upper extremity venous study was performed today.  This shows resolution of his previous DVT with no right upper extremity thrombosis or central venous thrombosis identified at this point.   Past Medical History:  Diagnosis Date   Asthma    as a child   Bronchitis 03/2016   pt has recovered from bronchitis   Chickenpox    Depression    Frequent headaches    GERD (gastroesophageal reflux disease)    when I was younger, better now.   Hypertension    Migraines    history of, not now   OSA (obstructive sleep apnea)     Past Surgical History:  Procedure Laterality Date   APPENDECTOMY  1977   HERNIA REPAIR     HYDROCELE EXCISION Right 06/01/2016   Procedure: HYDROCELECTOMY ADULT;  Surgeon: Rosina Riis, MD;  Location: [facility] ORS;  Service: Urology;  Laterality: Right;   knee Left 1994   KNEE  ARTHROSCOPY Right 1992   LAPAROTOMY N/A 07/13/2023   Procedure: LAPAROTOMY, EXPLORATORY;  Surgeon: Tye Millet, DO;  Location: [facility] ORS;  Service: General;  Laterality: N/A;  Pink pad for case is needed possilble open   LAPAROTOMY N/A 07/17/2023   Procedure: LAPAROTOMY, EXPLORATORY, ABDOMINAL WASHOUT WITH CLOSURE;  Surgeon: Desiderio Schanz, MD;  Location: [facility] ORS;  Service: General;  Laterality: N/A;   UMBILICAL HERNIA REPAIR  2013   VASECTOMY N/A 06/01/2016   Procedure: VASECTOMY;  Surgeon: Rosina Riis, MD;  Location: [facility] ORS;  Service: Urology;  Laterality: N/A;     Family History  Problem Relation Age of Onset   Ovarian cancer Mother    Alcohol abuse Father    Hyperlipidemia Father    Hypertension Father    Prostate cancer Maternal Uncle    Arthritis Maternal Grandmother    Colon cancer Maternal Grandmother    Ovarian cancer Maternal Grandmother    Breast cancer Maternal Grandmother    Hyperlipidemia Maternal Grandfather    Hypertension Maternal Grandfather    Hyperlipidemia Paternal Grandmother    Heart attack Paternal Grandmother    Hyperlipidemia Paternal Grandfather    Hypertension Paternal Grandfather    Bladder Cancer Neg Hx    Kidney disease Neg Hx       Social History   Tobacco Use  Smoking status: Former    Current packs/day: 0.50    Types: Cigarettes   Smokeless tobacco: Never   Tobacco comments:    March 11 2016  Vaping Use   Vaping status: Never Used  Substance Use Topics   Alcohol use: Not Currently    Alcohol/week: 4.0 - 6.0 standard drinks of alcohol    Types: 4 - 6 Shots of liquor per week   Drug use: No     Allergies  Allergen Reactions   Sulfa Antibiotics Anaphylaxis and Hives   Whole Blood Other (See Comments)    Recently had a blood transfusion that resulted in systemic sepsis and organ failure.    Current Outpatient Medications  Medication Sig Dispense Refill   acetaminophen  (TYLENOL ) 325 MG tablet Take 2 tablets (650 mg  total) by mouth every 6 (six) hours as needed for mild pain (pain score 1-3), fever or headache.     apixaban  (ELIQUIS ) 5 MG TABS tablet Take 1 tablet (5 mg total) by mouth 2 (two) times daily. 60 tablet 0   Cholecalciferol (D 1000) 25 MCG (1000 UT) capsule Take 5,000 Units by mouth daily.     omeprazole (PRILOSEC) 40 MG capsule Take 40 mg by mouth daily.     potassium chloride  (MICRO-K ) 10 MEQ CR capsule Take 1 capsule (10 mEq total) by mouth daily. 30 capsule 3   torsemide  (DEMADEX ) 20 MG tablet Take 1 tablet (20 mg total) by mouth 2 (two) times daily. 60 tablet 3   VENTOLIN  HFA 108 (90 Base) MCG/ACT inhaler Inhale 1 puff into the lungs every 4 (four) hours as needed.     No current facility-administered medications for this visit.      REVIEW OF SYSTEMS (Negative unless checked)  Constitutional: [] Weight loss  [] Fever  [] Chills Cardiac: [] Chest pain   [] Chest pressure   [] Palpitations   [] Shortness of breath when laying flat   [] Shortness of breath at rest   [x] Shortness of breath with exertion. Vascular:  [] Pain in legs with walking   [] Pain in legs at rest   [] Pain in legs when laying flat   [] Claudication   [] Pain in feet when walking  [] Pain in feet at rest  [] Pain in feet when laying flat   [x] History of DVT   [] Phlebitis   [x] Swelling in legs   [] Varicose veins   [] Non-healing ulcers Pulmonary:   [] Uses home oxygen   [] Productive cough   [] Hemoptysis   [] Wheeze  [] COPD   [] Asthma Neurologic:  [] Dizziness  [] Blackouts   [] Seizures   [] History of stroke   [] History of TIA  [] Aphasia   [] Temporary blindness   [] Dysphagia   [] Weakness or numbness in arms   [] Weakness or numbness in legs Musculoskeletal:  [x] Arthritis   [] Joint swelling   [] Joint pain   [] Low back pain Hematologic:  [] Easy bruising  [] Easy bleeding   [] Hypercoagulable state   [] Anemic  [] Hepatitis Gastrointestinal:  [] Blood in stool   [] Vomiting blood  [x] Gastroesophageal reflux/heartburn   [] Abdominal pain Genitourinary:   [] Chronic kidney disease   [] Difficult urination  [] Frequent urination  [] Burning with urination   [] Hematuria Skin:  [] Rashes   [] Ulcers   [] Wounds Psychological:  [] History of anxiety   []  History of major depression.    Physical Exam BP 138/81 (BP Location: Left Arm, Patient Position: Sitting, Cuff Size: Large)   Pulse 76   Ht 6' 1 (1.854 m)   Wt (!) 351 lb 12.8 oz (159.6 kg)   BMI 46.41 kg/m  Gen:  WD/WN, NAD. Obese  Head: [place]/AT, No temporalis wasting.  Ear/Nose/Throat: Hearing grossly intact, nares w/o erythema or drainage, oropharynx w/o Erythema/Exudate Eyes: Conjunctiva clear, sclera non-icteric  Neck: trachea midline.  No JVD.  Pulmonary:  Good air movement, respirations not labored, no use of accessory muscles  Cardiac: RRR, no JVD Vascular:  Vessel Right Left  Radial Palpable Palpable                                   Musculoskeletal: M/S 5/5 throughout.  Extremities without ischemic changes.  No deformity or atrophy.  Fairly prominent varicosities are present more in the right lower extremity than the left.  Moderate stasis dermatitis changes are present bilaterally.  1+ bilateral lower extremity edema. Neurologic: Sensation grossly intact in extremities.  Symmetrical.  Speech is fluent. Motor exam as listed above. Psychiatric: Judgment intact, Mood & affect appropriate for pt's clinical situation. Dermatologic: No rashes or ulcers noted.  No cellulitis or open wounds.    Radiology ECHOCARDIOGRAM COMPLETE Result Date: 10/26/2023    ECHOCARDIOGRAM REPORT   Patient Name:   [name] Date of Exam: 10/26/2023 Medical Rec #:  [id number]       Height:       73.0 in Accession #:    [id number]      Weight:       364.1 lb Date of Birth:  November 12, 1965        BSA:          2.776 m Patient Age:    57 years        BP:           138/80 mmHg Patient Gender: M               HR:           69 bpm. Exam Location:  [facility] Procedure: 2D Echo, Cardiac Doppler, Color Doppler and  Intracardiac            Opacification Agent (Both Spectral and Color Flow Doppler were            utilized during procedure). Indications:     Dyspnea R06.00  History:         Patient has prior history of Echocardiogram examinations, most                  recent 07/14/2023.  Sonographer:     Ashley McNeely-Sloane Referring Phys:  [id number] KHABIB DGAYLI Diagnosing Phys: Lonni Hanson MD IMPRESSIONS  1. Left ventricular ejection fraction, by estimation, is 55 to 60%. The left ventricle has normal function. The left ventricle has no regional wall motion abnormalities. Left ventricular diastolic parameters were normal.  2. Right ventricular systolic function is normal. The right ventricular size is mildly enlarged. Mildly increased right ventricular wall thickness. Tricuspid regurgitation signal is inadequate for assessing PA pressure.  3. Left atrial size was mildly dilated.  4. Right atrial size was mildly dilated.  5. The mitral valve was not well visualized. No evidence of mitral valve regurgitation. No evidence of mitral stenosis.  6. The aortic valve was not well visualized. Aortic valve regurgitation is not visualized. No aortic stenosis is present.  7. Pulmonic valve regurgitation not well-assessed.  8. The inferior vena cava is normal in size with greater than 50% respiratory variability, suggesting right atrial pressure of 3 mmHg.  9. Cannot exclude a small PFO. FINDINGS  Left  Ventricle: Left ventricular ejection fraction, by estimation, is 55 to 60%. The left ventricle has normal function. The left ventricle has no regional wall motion abnormalities. Definity  contrast agent was given IV to delineate the left ventricular  endocardial borders. The left ventricular internal cavity size was normal in size. Suboptimal image quality limits for assessment of left ventricular hypertrophy. Left ventricular diastolic parameters were normal. Right Ventricle: The right ventricular size is mildly enlarged. Mildly  increased right ventricular wall thickness. Right ventricular systolic function is normal. Tricuspid regurgitation signal is inadequate for assessing PA pressure. Left Atrium: Left atrial size was mildly dilated. Right Atrium: Right atrial size was mildly dilated. Pericardium: There is no evidence of pericardial effusion. Mitral Valve: The mitral valve was not well visualized. No evidence of mitral valve regurgitation. No evidence of mitral valve stenosis. MV peak gradient, 3.9 mmHg. The mean mitral valve gradient is 2.0 mmHg. Tricuspid Valve: The tricuspid valve is normal in structure. Tricuspid valve regurgitation is trivial. Aortic Valve: The aortic valve was not well visualized. Aortic valve regurgitation is not visualized. No aortic stenosis is present. Aortic valve mean gradient measures 6.0 mmHg. Aortic valve peak gradient measures 11.6 mmHg. Aortic valve area, by VTI measures 2.63 cm. Pulmonic Valve: The pulmonic valve was not well visualized. Pulmonic valve regurgitation not well-assessed. Aorta: The aortic root and ascending aorta are structurally normal, with no evidence of dilitation. Pulmonary Artery: The pulmonary artery is not well seen. Venous: The inferior vena cava is normal in size with greater than 50% respiratory variability, suggesting right atrial pressure of 3 mmHg. IAS/Shunts: Cannot exclude a small PFO.  LEFT VENTRICLE PLAX 2D LVIDd:         5.60 cm      Diastology LVIDs:         3.45 cm      LV e' medial:    8.49 cm/s LV PW:         1.50 cm      LV E/e' medial:  10.2 LV IVS:        0.80 cm      LV e' lateral:   11.70 cm/s LVOT diam:     2.20 cm      LV E/e' lateral: 7.4 LV SV:         94 LV SV Index:   34 LVOT Area:     3.80 cm  LV Volumes (MOD) LV vol d, MOD A2C: 70.0 ml LV vol d, MOD A4C: 166.0 ml LV vol s, MOD A2C: 38.8 ml LV vol s, MOD A4C: 65.5 ml LV SV MOD A2C:     31.2 ml LV SV MOD A4C:     166.0 ml LV SV MOD BP:      60.7 ml RIGHT VENTRICLE             IVC RV Basal diam:  5.10 cm      IVC diam: 1.60 cm RV Mid diam:    3.10 cm RV S prime:     14.60 cm/s TAPSE (M-mode): 2.0 cm LEFT ATRIUM             Index        RIGHT ATRIUM           Index LA Vol (A2C):   61.9 ml 22.30 ml/m  RA Area:     24.80 cm LA Vol (A4C):   72.9 ml 26.26 ml/m  RA Volume:   82.60 ml  29.76 ml/m LA Biplane Vol: 73.3  ml 26.41 ml/m  AORTIC VALVE                     PULMONIC VALVE AV Area (Vmax):    2.64 cm      PV Vmax:       0.98 m/s AV Area (Vmean):   2.60 cm      PV Vmean:      66.600 cm/s AV Area (VTI):     2.63 cm      PV VTI:        0.182 m AV Vmax:           170.00 cm/s   PV Peak grad:  3.8 mmHg AV Vmean:          114.000 cm/s  PV Mean grad:  2.0 mmHg AV VTI:            0.356 m AV Peak Grad:      11.6 mmHg AV Mean Grad:      6.0 mmHg LVOT Vmax:         118.00 cm/s LVOT Vmean:        78.100 cm/s LVOT VTI:          0.246 m LVOT/AV VTI ratio: 0.69  AORTA Ao Root diam: 2.80 cm Ao Asc diam:  2.70 cm MITRAL VALVE MV Area (PHT): 3.27 cm    SHUNTS MV Area VTI:   3.25 cm    Systemic VTI:  0.25 m MV Peak grad:  3.9 mmHg    Systemic Diam: 2.20 cm MV Mean grad:  2.0 mmHg MV Vmax:       0.99 m/s MV Vmean:      63.2 cm/s MV Decel Time: 232 msec MV E velocity: 86.80 cm/s MV A velocity: 59.20 cm/s MV E/A ratio:  1.47 Lonni End MD Electronically signed by Lonni Hanson MD Signature Date/Time: 10/26/2023/3:09:37 PM    Final     Labs Recent Results (from the past 2160 hours)  Basic metabolic panel with GFR     Status: None   Collection Time: 09/08/23  2:24 PM  Result Value Ref Range   Sodium 139 135 - 145 mmol/L   Potassium 3.9 3.5 - 5.1 mmol/L   Chloride 110 98 - 111 mmol/L   CO2 24 22 - 32 mmol/L   Glucose, Bld 94 70 - 99 mg/dL    Comment: Glucose reference range applies only to samples taken after fasting for at least 8 hours.   BUN 10 6 - 20 mg/dL   Creatinine, Ser 9.19 0.61 - 1.24 mg/dL   Calcium 9.0 8.9 - 89.6 mg/dL   GFR, Estimated >39 >39 mL/min    Comment: (NOTE) Calculated using the CKD-EPI  Creatinine Equation (2021)    Anion gap 5 5 - 15    Comment: Performed at [facility], [address] [place], [place] [address]  B Nat Peptide     Status: None   Collection Time: 09/08/23  2:24 PM  Result Value Ref Range   B Natriuretic Peptide 73.9 0.0 - 100.0 pg/mL    Comment: Performed at [facility], [address] [place], [place] [address]  [facility] Molecular Screen - Blood [facility])     Status: None   Collection Time: 09/13/23  2:28 PM  Result Value Ref Range   Genetic Analysis Overall Interpretation Negative    Genetic Disease Assessed      This is a screening test and does not detect  all pathogenic or likely pathogenic variant(s) in the tested genes; diagnostic testing is recommended for individuals with a personal or family history of heart disease or hereditary cancer. [facility] Tier One  Population Screen is a screening test that analyzes 11 genes related to hereditary breast and ovarian cancer (HBOC) syndrome, Lynch syndrome, and familial hypercholesterolemia. This test only reports clinically significant pathogenic and likely  pathogenic variants but does not report variants of uncertain significance (VUS). In addition, analysis of the PMS2 gene excludes exons 11-15, which overlap with a known pseudogene (PMS2CL).    Genetic Analysis Report      No pathogenic or likely pathogenic variants were detected in the genes analyzed by this test.Genetic test results should be interpreted in the context of an individual's personal medical and family history. Alteration to medical management is NOT  recommended based solely on this result. Clinical correlation is advised.Additional Considerations- This is a screening test; individuals may still carry pathogenic or likely pathogenic variant(s) in the tested genes that are not detected by this test.-  For individuals at risk for these or other related conditions based on factors including personal or  family history, diagnostic testing is recommended.- The absence of pathogenic or likely pathogenic variant(s) in the analyzed genes, while reassuring,  does not eliminate the possibility of a hereditary condition; there are other variants and genes associated with heart disease and hereditary cancer that are not included in this test.    Genes Tested See Notes     Comment: APOB, BRCA1, BRCA2, EPCAM, LDLR, LDLRAP1, PCSK9, PMS2, MLH1, MSH2, MSH6   Disclaimer See Notes     Comment: This test was developed and validated by [facility] This test has not been cleared or approved by the [place]  Food and Drug Administration (FDA). The [facility] laboratory is accredited by the College of American Pathologists (CAP) and certified under  the Clinical Laboratory Improvement Amendments (CLIA #: [id number]) to perform high-complexity clinical tests. This test is used for clinical purposes. It should not be regarded as investigational or for research.    Sequencing Location See Notes     Comment: Sequencing done at [facility] [address], [address], [place] [address] (CLIA# [id number])   Interpretation Methods and Limitations See Notes     Comment: Extracted DNA is enriched for targeted regions and then sequenced using the [facility] Exome+ (R) assay on an Illumina DNA sequencing system. Data is then aligned to a modified version of GRCh38 and all genes are analyzed using the MANE transcript and MANE  Plus Clinical transcript, when available. Small variant calling is completed using a customized version of [employer]'s DNAseq software, augmented by a proprietary small variant caller for difficult variants. Copy number variants (CNVs) are then called  using a proprietary bioinformatics pipeline based on depth analysis with a comparison to similarly sequenced samples. Analysis of the PMS2 gene is limited to exons 1-10. The interpretation and reporting of variants in APOB, PCSK9, and LDLR is specific to   familial hypercholesterolemia; variants associated with hypobetalipoproteinemia are not included. Interpretation is based upon guidelines published by the [employer] and [employer]), the Association for Mol ecular Pathology  (AMP) or their modification by [employer] when available and/or review of previous clinical assertions available in the [employer]. Interpretation is limited to the transcripts indicated on the report and +/- 10 bp into  intronic regions, except as noted below. [facility] variant classifications include pathogenic, likely pathogenic,  variant of uncertain significance (VUS), likely benign, and benign. Only variants classified as pathogenic and likely pathogenic are included in  the report. All reported variants are confirmed through secondary manual inspection of DNA sequence data or orthogonal testing. Risk estimations and management guidelines included in this report are based on analysis of primary literature and  recommendations of applicable professional societies, and should be regarded as approximations.Based on validation studies, this assay delivers > 99% sensitivity and specificity for single nucleotide variants and insertions  and deletions (indels) up to  20 bp. Larger indels and complex variants are also reported but sensitivity may be reduced. Based on validation studies, this assay delivers > 99% sensitivity to multi-exon CNVs and > 90% sensitivity to single-exon CNVs. This test may not detect variants  in challenging regions (such as short tandem repeats, homopolymer runs, and segment duplications), sub-exonic CNVs, chromosomal aneuploidy, or variants in the presence of mosaicism. Phasing will be attempted and reported, when possible. Structural  rearrangements such as inversions, translocations, and gene conversions are not tested in this assay unless explicitly indicated. Additionally, deep intronic, promoter, and  enhancer regions may not be covered. It is important to note that this is a  screening test and cannot detect all disease-causing variants. A negative result does not guarantee the absence of a rare, undetectable variant in the genes analyzed; consider using a diagnostic test if there i s significant personal and/or family history  of one of the conditions analyzed by this test. Any potential incidental findings outside of these genes and conditions will not be identified, nor reported. The results of a genetic test may be influenced by various factors, including bone marrow  transplantation, blood transfusions, or in rare cases, hematolymphoid neoplasms.Gene Specific Notes:APOB: analysis is limited to c.10580G>A and c.10579C>T; BRCA1: sequencing analysis extends to CDS +/-20 bp; BRCA2: sequencing analysis extends to CDS  +/-20 bp. EPCAM: analysis is limited to CNVof exons 8-9; LDLR: analysis includes CNV ofthe promoter; MLH1: analysis includes CNV of the promoter; PMS2: analysis is limited to exons 1-10.Donnice JINNY Kemp, PhD, [email or web address]   Basic Metabolic Panel (BMET)     Status: Abnormal   Collection Time: 10/18/23 10:29 AM  Result Value Ref Range   Glucose 80 70 - 99 mg/dL   BUN 9 6 - 24 mg/dL   Creatinine, Ser 9.08 0.76 - 1.27 mg/dL   eGFR 899 >40 fO/fpw/8.26   BUN/Creatinine Ratio 10 9 - 20   Sodium 144 134 - 144 mmol/L   Potassium 4.2 3.5 - 5.2 mmol/L   Chloride 107 (H) 96 - 106 mmol/L   CO2 20 20 - 29 mmol/L   Calcium 9.0 8.7 - 10.2 mg/dL  ECHOCARDIOGRAM COMPLETE     Status: None   Collection Time: 10/26/23 10:02 AM  Result Value Ref Range   Ao pk vel 1.70 m/s   AV Area VTI 2.63 cm2   AR max vel 2.64 cm2   AV Mean grad 6.0 mmHg   AV Peak grad 11.6 mmHg   Single Plane A2C EF 44.6 %   Single Plane A4C EF 60.5 %   Calc EF 52.8 %   S' Lateral 3.45 cm   AV Area mean vel 2.60 cm2   Area-P 1/2 3.27 cm2   MV VTI 3.25 cm2   Est EF 55 - 60%   Pulmonary Function Test      Status: None   Collection Time: 10/27/23  9:45 AM  Result Value Ref Range   FVC-Pre 4.07 L  FVC-%Pred-Pre 72 %   FVC-Post 4.10 L   FVC-%Pred-Post 72 %   FVC-%Change-Post 0 %   FEV1-Pre 2.74 L   FEV1-%Pred-Pre 63 %   FEV1-Post 2.88 L   FEV1-%Pred-Post 67 %   FEV1-%Change-Post 5 %   FEV6-Pre 4.02 L   FEV6-%Pred-Pre 74 %   FEV6-Post 4.07 L   FEV6-%Pred-Post 75 %   FEV6-%Change-Post 1 %   Pre FEV1/FVC ratio 67 %   FEV1FVC-%Pred-Pre 87 %   Post FEV1/FVC ratio 70 %   FEV1FVC-%Change-Post 4 %   Pre FEV6/FVC Ratio 99 %   FEV6FVC-%Pred-Pre 102 %   Post FEV6/FVC ratio 99 %   FEV6FVC-%Pred-Post 103 %   FEV6FVC-%Change-Post 0 %   FEF 25-75 Pre 1.64 L/sec   FEF2575-%Pred-Pre 45 %   FEF 25-75 Post 1.92 L/sec   FEF2575-%Pred-Post 53 %   FEF2575-%Change-Post 17 %   RV 2.84 L   RV % pred 120 %   TLC 6.58 L   TLC % pred 84 %   DLCO unc 15.25 ml/min/mmHg   DLCO unc % pred 47 %   DL/VA 5.77 ml/min/mmHg/L   DL/VA % pred 99 %    Assessment/Plan:  Arm DVT (deep venous thromboembolism), acute, right (HCC) A right upper extremity venous study was performed today.  This shows resolution of his previous DVT with no right upper extremity thrombosis or central venous thrombosis identified at this point. For catheter related upper extremity DVT, he has now received about 4 months of anticoagulation which is likely adequate.  I would like to get a venous study of his lower extremities before we stop his anticoagulation to make sure there are no thromboembolic issues in the lower extremities as well.  Hypertension blood pressure control important in reducing the progression of atherosclerotic disease. On appropriate oral medications.   Swelling of arm From his catheter related DVT.  Now basically resolved.  Venous (peripheral) insufficiency  Recommend:  The patient has large symptomatic varicose veins that are painful and associated with swelling. The patient is CEAP [id number]   I have  had a long discussion with the patient regarding  varicose veins and why they cause symptoms.  Patient will begin wearing graduated compression stockings class 1 on a daily basis, beginning first thing in the morning and removing them in the evening. The patient is instructed specifically not to sleep in the stockings.    The patient  will also begin using over-the-counter analgesics such as Motrin 600 mg po TID to help control the symptoms.    In addition, behavioral modification including elevation during the day will be initiated.    Pending the results of these changes the  patient will be reevaluated in three months.   An ultrasound of the venous system will be obtained.   Further plans will be based on the ultrasound results and whether conservative therapies are successful at eliminating the pain and swelling.       [name] 11/16/2023, 2:37 PM   This note was created with Dragon medical transcription system.  Any errors from dictation are unintentional.

## 2023-11-16 NOTE — Progress Notes (Deleted)
 Cardiology Office Note    Date:  11/16/2023   ID:  [name], DOB 11-14-1965, MRN [id number]  PCP:  Stephanie Charlene CROME, MD  Cardiologist:  Redell Cave, MD  Electrophysiologist:  None   Chief Complaint: Follow-up  History of Present Illness:   [name] is a 58 y.o. male with history of ***  ***   Labs independently reviewed: 09/2023 - BUN 9, serum creatinine 0.91, potassium 4.2, TSH normal, Hgb 13.8, PLT 136, albumin  3.3, AST/ALT normal 08/2022 - BNP 73 07/2022 - magnesium  2.0  Past Medical History:  Diagnosis Date   Asthma    as a child   Bronchitis 03/2016   pt has recovered from bronchitis   Chickenpox    Depression    Frequent headaches    GERD (gastroesophageal reflux disease)    when I was younger, better now.   Hypertension    Migraines    history of, not now   OSA (obstructive sleep apnea)     Past Surgical History:  Procedure Laterality Date   APPENDECTOMY  1977   HERNIA REPAIR     HYDROCELE EXCISION Right 06/01/2016   Procedure: HYDROCELECTOMY ADULT;  Surgeon: Rosina Riis, MD;  Location: [facility];  Service: Urology;  Laterality: Right;   knee Left 1994   KNEE ARTHROSCOPY Right 1992   LAPAROTOMY N/A 07/13/2023   Procedure: LAPAROTOMY, EXPLORATORY;  Surgeon: Tye Millet, DO;  Location: [facility];  Service: General;  Laterality: N/A;  Pink pad for case is needed possilble open   LAPAROTOMY N/A 07/17/2023   Procedure: LAPAROTOMY, EXPLORATORY, ABDOMINAL WASHOUT WITH CLOSURE;  Surgeon: Desiderio Schanz, MD;  Location: [facility];  Service: General;  Laterality: N/A;   UMBILICAL HERNIA REPAIR  2013   VASECTOMY N/A 06/01/2016   Procedure: VASECTOMY;  Surgeon: Rosina Riis, MD;  Location: [facility];  Service: Urology;  Laterality: N/A;    Current Medications: No outpatient medications have been marked as taking for the 11/16/23 encounter (Appointment) with Abigail Bernardino HERO, PA-C.    Allergies:   Sulfa antibiotics and Whole blood   Social History    Socioeconomic History   Marital status: Widowed    Spouse name: Not on file   Number of children: Not on file   Years of education: Not on file   Highest education level: Not on file  Occupational History   Not on file  Tobacco Use   Smoking status: Former    Current packs/day: 0.50    Types: Cigarettes   Smokeless tobacco: Never   Tobacco comments:    March 11 2016  Vaping Use   Vaping status: Never Used  Substance and Sexual Activity   Alcohol use: Not Currently    Alcohol/week: 4.0 - 6.0 standard drinks of alcohol    Types: 4 - 6 Shots of liquor per week   Drug use: No   Sexual activity: Not on file  Other Topics Concern   Not on file  Social History Narrative   Single.   2 children.   Works independently as Building services engineer.   Enjoys reading, watching tv.   Social Drivers of Corporate investment banker Strain: Low Risk  (10/18/2023)   Received from [facility]   Overall Financial Resource Strain (CARDIA)    Difficulty of Paying Living Expenses: Not hard at all  Food Insecurity: No Food Insecurity (10/18/2023)   Received from [facility]   Hunger Vital Sign    Within the  past 12 months, you worried that your food would run out before you got the money to buy more.: Never true    Within the past 12 months, the food you bought just didn't last and you didn't have money to get more.: Never true  Transportation Needs: No Transportation Needs (10/18/2023)   Received from [facility] - Transportation    In the past 12 months, has lack of transportation kept you from medical appointments or from getting medications?: No    Lack of Transportation (Non-Medical): No  Physical Activity: Not on file  Stress: Not on file  Social Connections: Not on file     Family History:  The patient's family history includes Alcohol abuse in his father; Arthritis in his maternal grandmother; Breast cancer in his maternal grandmother;  Colon cancer in his maternal grandmother; Heart attack in his paternal grandmother; Hyperlipidemia in his father, maternal grandfather, paternal grandfather, and paternal grandmother; Hypertension in his father, maternal grandfather, and paternal grandfather; Ovarian cancer in his maternal grandmother and mother; Prostate cancer in his maternal uncle. There is no history of Bladder Cancer or Kidney disease.  ROS:   12-point review of systems is negative unless otherwise noted in the HPI.   EKGs/Labs/Other Studies Reviewed:    Studies reviewed were summarized above. The additional studies were reviewed today:  2D echo 07/14/2023: 1. Left ventricular ejection fraction, by estimation, is 65 to 70%. Left  ventricular ejection fraction by PLAX is 73 %. The left ventricle has  normal function. The left ventricle has no regional wall motion  abnormalities. There is mild left ventricular  hypertrophy. Left ventricular diastolic parameters were normal.   2. Right ventricular systolic function is normal. The right ventricular  size is normal. There is normal pulmonary artery systolic pressure. The  estimated right ventricular systolic pressure is 27.0 mmHg.   3. The mitral valve is normal in structure. No evidence of mitral valve  regurgitation. No evidence of mitral stenosis.   4. The aortic valve has an indeterminant number of cusps. Aortic valve  regurgitation is not visualized. No aortic stenosis is present.   5. The inferior vena cava is normal in size with greater than 50%  respiratory variability, suggesting right atrial pressure of 3 mmHg.  __________  2D echo 10/26/2023: 1. Left ventricular ejection fraction, by estimation, is 55 to 60%. The  left ventricle has normal function. The left ventricle has no regional  wall motion abnormalities. Left ventricular diastolic parameters were  normal.   2. Right ventricular systolic function is normal. The right ventricular  size is mildly  enlarged. Mildly increased right ventricular wall  thickness. Tricuspid regurgitation signal is inadequate for assessing PA  pressure.   3. Left atrial size was mildly dilated.   4. Right atrial size was mildly dilated.   5. The mitral valve was not well visualized. No evidence of mitral valve  regurgitation. No evidence of mitral stenosis.   6. The aortic valve was not well visualized. Aortic valve regurgitation  is not visualized. No aortic stenosis is present.   7. Pulmonic valve regurgitation not well-assessed.   8. The inferior vena cava is normal in size with greater than 50%  respiratory variability, suggesting right atrial pressure of 3 mmHg.   9. Cannot exclude a small PFO.    EKG:  EKG is ordered today.  The EKG ordered today demonstrates ***  Recent Labs: 07/26/2023: ALT 33 08/03/2023: Hemoglobin 12.2; Platelets 275 08/05/2023: Magnesium   2.0 09/08/2023: B Natriuretic Peptide 73.9 10/18/2023: BUN 9; Creatinine, Ser 0.91; Potassium 4.2; Sodium 144  Recent Lipid Panel    Component Value Date/Time   CHOL 130 04/08/2016 0814   TRIG 64 07/26/2023 0558   HDL 43.70 04/08/2016 0814   CHOLHDL 3 04/08/2016 0814   VLDL 15.8 04/08/2016 0814   LDLCALC 71 04/08/2016 0814    PHYSICAL EXAM:    VS:  There were no vitals taken for this visit.  BMI: There is no height or weight on file to calculate BMI.  Physical Exam  Wt Readings from Last 3 Encounters:  10/27/23 (!) 361 lb 9.6 oz (164 kg)  10/27/23 (!) 361 lb 9.6 oz (164 kg)  09/30/23 (!) 364 lb 2 oz (165.2 kg)     ASSESSMENT & PLAN:   ***   {Are you ordering a CV Procedure (e.g. stress test, cath, DCCV, TEE, etc)?   Press F2        :[id number]}     Disposition: F/u with Dr. Darliss or an APP in ***.   Medication Adjustments/Labs and Tests Ordered: Current medicines are reviewed at length with the patient today.  Concerns regarding medicines are outlined above. Medication changes, Labs and Tests ordered today are  summarized above and listed in the Patient Instructions accessible in Encounters.   Signed, Bernardino Bring, PA-C 11/16/2023 8:02 AM     [facility] -  [address] [place], [place] [address] [phone]

## 2023-11-16 NOTE — Patient Instructions (Signed)
Chronic Venous Insufficiency Chronic venous insufficiency is a condition that causes the veins in the legs to struggle to pump blood from the legs to the heart. It is also called venous stasis. This condition can happen when the vein walls are stretched, weakened, or damaged. It can also happen when the valves inside the vein are damaged. With the right treatment, you should be able to still lead an active life. What are the causes? Common causes of this condition include: Venous hypertension. This is high blood pressure inside the veins. Sitting or standing too long. This can cause increased blood pressure in the veins of the leg. Deep vein thrombosis (DVT). This is a blood clot that blocks blood flow in a vein. Phlebitis. This is inflammation of a vein. It can cause a blood clot to form. An abnormal growth of cells (tumor) in the area between your hip bones (pelvis). This can cause blood to back up. What increases the risk? Factors that may make you more likely to get this condition include: Having a family history of the condition. Being overweight. Being pregnant. Not getting enough exercise. Smoking. Having a job that requires you to sit or stand in one place for a long time. Being a certain age. Females in their 23s and 5s and males in their 20s are more likely to get this condition. What are the signs or symptoms? Symptoms of this condition include: Varicose veins. These are veins that are enlarged, bulging, or twisted. Skin breakdown or ulcers. Reddened skin or dark discoloration of the skin on the leg between the knee and ankle. Lipodermatosclerosis. This is brown, smooth, tight, and painful skin just above the ankle. It is often on the inside of the leg. Swelling of the legs. How is this diagnosed? This condition may be diagnosed based on your medical history and a physical exam. You may also need tests, such as: A duplex ultrasound. This shows how blood flows through a blood  vessel. Plethysmography. This tests blood flow. Venogram. This looks at the veins using an X-ray and dye. How is this treated? The goals of treatment are to help you return to an active life and to relieve pain. Treatment may include: Wearing compression stockings. These do not cure the condition but can help relieve symptoms. They can also help stop your condition from getting worse. Sclerotherapy. This involves injecting a solution to shrink damaged veins. Surgery. This may include: Vein stripping. This is when a diseased vein is taken out. Laser ablation surgery. This is when blood flow is cut off through the vein. Repairing or remaking a valve inside the affected vein. Follow these instructions at home: Lifestyle Do not use any products that contain nicotine or tobacco. These products include cigarettes, chewing tobacco, and vaping devices, such as e-cigarettes. If you need help quitting, ask your health care provider. Stay active. Exercise, walk, or do other activities. Ask your provider what activities are safe for you. General instructions Take over-the-counter and prescription medicines only as told by your provider. Drink enough fluid to keep your pee (urine) pale yellow. Wear compression stockings as told by your provider. These stockings help to prevent blood clots and reduce swelling in your legs. Keep all follow-up visits. Your provider will check your legs for any changes and adjust your treatment plan as needed. Contact a health care provider if: You have redness, swelling, or more pain in the affected area. You see a red streak or line that goes up or down from the  area. You have skin breakdown or skin loss. You get an injury in the affected area. You get a fever. Get help right away if: You have severe pain that does not get better with medicine. You get an injury and an open wound in the affected area. Your foot or ankle becomes numb or weak all of a sudden. You have  trouble moving your foot or ankle. Your symptoms do not go away or get worse. You have chest pain. You have shortness of breath. These symptoms may be an emergency. Get help right away. Call 911. Do not wait to see if the symptoms will go away. Do not drive yourself to the hospital. This information is not intended to replace advice given to you by your health care provider. Make sure you discuss any questions you have with your health care provider. Document Revised: 04/28/2022 Document Reviewed: 04/28/2022 Elsevier Patient Education  2024 [employer].

## 2023-11-16 NOTE — Assessment & Plan Note (Signed)
 From his catheter related DVT.  Now basically resolved.

## 2023-11-16 NOTE — Assessment & Plan Note (Signed)
 blood pressure control important in reducing the progression of atherosclerotic disease. On appropriate oral medications.

## 2023-11-16 NOTE — Assessment & Plan Note (Signed)
Recommend:  The patient has large symptomatic varicose veins that are painful and associated with swelling. The patient is CEAP [id number]   I have had a long discussion with the patient regarding  varicose veins and why they cause symptoms.  Patient will begin wearing graduated compression stockings class 1 on a daily basis, beginning first thing in the morning and removing them in the evening. The patient is instructed specifically not to sleep in the stockings.    The patient  will also begin using over-the-counter analgesics such as Motrin 600 mg po TID to help control the symptoms.    In addition, behavioral modification including elevation during the day will be initiated.    Pending the results of these changes the  patient will be reevaluated in three months.   An ultrasound of the venous system will be obtained.   Further plans will be based on the ultrasound results and whether conservative therapies are successful at eliminating the pain and swelling.  

## 2023-11-16 NOTE — Assessment & Plan Note (Signed)
 A right upper extremity venous study was performed today.  This shows resolution of his previous DVT with no right upper extremity thrombosis or central venous thrombosis identified at this point. For catheter related upper extremity DVT, he has now received about 4 months of anticoagulation which is likely adequate.  I would like to get a venous study of his lower extremities before we stop his anticoagulation to make sure there are no thromboembolic issues in the lower extremities as well.

## 2023-11-22 NOTE — Progress Notes (Deleted)
 Cardiology Office Note    Date:  11/22/2023   ID:  [name], DOB 1966-03-22, MRN [id number]  PCP:  Stephanie Charlene CROME, MD  Cardiologist:  Redell Cave, MD  Electrophysiologist:  None   Chief Complaint: Follow-up  History of Present Illness:   [name] is a 58 y.o. 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  During the procedure he had significant bleeding which was unusual and required electrocautery and ligature for hemostasis.  Admission was further complicated by abdominal pain requiring JP drain and FFP due to intraoperative bleeding with further decompensation of the patient developing circulatory shock, hypoxia, and swelling of the lower extremities.  He was treated for anaphylactic nonfebrile blood product reaction versus TRALI.  He subsequently underwent exploratory laparotomy with abdominal washout.  Admission was further complicated by small bowel evisceration secondary to fascial dehiscence, multilobar pneumonia and hemodynamic instability requiring mechanical ventilation with repeat exploratory laparotomy.  Admission also notable for occlusive DVT in the right basilic, axillary, and subclavian veins felt to be secondary to PICC line.  Echo in 06/2023 showed an EF of 65 to 70%, no regional wall motion abnormalities, mild LVH, normal LV diastolic function parameters, normal RV systolic function and ventricular cavity size, normal RVSP estimated at 27 mmHg, no significant valvular abnormalities, and an estimated right atrial pressure of 3 mmHg.  High-resolution chest CT in 08/2023 indicative of evolving postinfectious/postinflammatory scarring and/or fibrosis.  He was  evaluated by Dr. Cave as a new patient in 09/2023 for evaluation of shortness of breath with minimal activity, occasional chest pressure, and lower extremity edema that was worse as the day progressed.  At that time he was taking furosemide  20 mg with recent titration to 30 mg.  At that time, etiology was felt to be multifactorial, though cardiac etiology was not felt to be primary.  He was transition from furosemide  to torsemide  40 mg daily and advised to follow-up with pulmonology for management of untreated sleep apnea.  Repeat echo in 10/2023 showed an EF of 55 to 60%, no regional wall motion abnormalities, normal LV diastolic function parameters, normal RV systolic function with mildly increased RV wall thickness and mildly enlarged ventricular cavity size, mild biatrial enlargement, no significant valvular abnormalities, and an estimated right atrial pressure of 3 mmHg.  He followed up with pulmonology on 10/27/2023 with PFTs suggestive of COPD/asthma overlap, though symptoms improved without inhalers and with diuresis.  Pulmonology felt his symptoms of dyspnea were concerning for pulmonary hypertension with recommendation for RHC.  ***   Labs independently reviewed: 09/2023 - BUN 9, serum creatinine 0.91, potassium 4.2, TSH normal, Hgb 13.8, PLT 136, albumin  3.3, AST/ALT normal 08/2022 - BNP 73 07/2022 - magnesium  2.0  Past Medical History:  Diagnosis Date   Asthma    as a child   Bronchitis 03/2016   pt has recovered from bronchitis   Chickenpox    Depression    Frequent headaches    GERD (gastroesophageal reflux disease)    when I was younger, better now.   Hypertension    Migraines    history of, not now   OSA (obstructive sleep apnea)     Past Surgical History:  Procedure Laterality Date  APPENDECTOMY  1977   HERNIA REPAIR     HYDROCELE EXCISION Right 06/01/2016   Procedure: HYDROCELECTOMY ADULT;  Surgeon: Rosina Riis, MD;  Location: [facility];  Service: Urology;   Laterality: Right;   knee Left 1994   KNEE ARTHROSCOPY Right 1992   LAPAROTOMY N/A 07/13/2023   Procedure: LAPAROTOMY, EXPLORATORY;  Surgeon: Tye Millet, DO;  Location: [facility];  Service: General;  Laterality: N/A;  Pink pad for case is needed possilble open   LAPAROTOMY N/A 07/17/2023   Procedure: LAPAROTOMY, EXPLORATORY, ABDOMINAL WASHOUT WITH CLOSURE;  Surgeon: Desiderio Schanz, MD;  Location: [facility];  Service: General;  Laterality: N/A;   UMBILICAL HERNIA REPAIR  2013   VASECTOMY N/A 06/01/2016   Procedure: VASECTOMY;  Surgeon: Rosina Riis, MD;  Location: [facility];  Service: Urology;  Laterality: N/A;    Current Medications: No outpatient medications have been marked as taking for the 11/23/23 encounter (Appointment) with Abigail Bernardino HERO, PA-C.    Allergies:   Sulfa antibiotics and Whole blood   Social History   Socioeconomic History   Marital status: Widowed    Spouse name: Not on file   Number of children: Not on file   Years of education: Not on file   Highest education level: Not on file  Occupational History   Not on file  Tobacco Use   Smoking status: Former    Current packs/day: 0.50    Types: Cigarettes   Smokeless tobacco: Never   Tobacco comments:    March 11 2016  Vaping Use   Vaping status: Never Used  Substance and Sexual Activity   Alcohol use: Not Currently    Alcohol/week: 4.0 - 6.0 standard drinks of alcohol    Types: 4 - 6 Shots of liquor per week   Drug use: No   Sexual activity: Not on file  Other Topics Concern   Not on file  Social History Narrative   Single.   2 children.   Works independently as Building services engineer.   Enjoys reading, watching tv.   Social Drivers of Corporate investment banker Strain: Low Risk  (10/18/2023)   Received from [facility]   Overall Financial Resource Strain (CARDIA)    Difficulty of Paying Living Expenses: Not hard at all  Food Insecurity: No Food Insecurity (10/18/2023)   Received from [facility]   Hunger Vital Sign    Within the past 12 months, you worried that your food would run out before you got the money to buy more.: Never true    Within the past 12 months, the food you bought just didn't last and you didn't have money to get more.: Never true  Transportation Needs: No Transportation Needs (10/18/2023)   Received from [facility]    In the past 12 months, has lack of transportation kept you from medical appointments or from getting medications?: No    Lack of Transportation (Non-Medical): No  Physical Activity: Not on file  Stress: Not on file  Social Connections: Not on file     Family History:  The patient's family history includes Alcohol abuse in his father; Arthritis in his maternal grandmother; Breast cancer in his maternal grandmother; Colon cancer in his maternal grandmother; Heart attack in his paternal grandmother; Hyperlipidemia in his father, maternal grandfather, paternal grandfather, and paternal grandmother; Hypertension in his father, maternal grandfather, and paternal grandfather; Ovarian cancer in his maternal grandmother and mother; Prostate cancer  in his maternal uncle. There is no history of Bladder Cancer or Kidney disease.  ROS:   12-point review of systems is negative unless otherwise noted in the HPI.   EKGs/Labs/Other Studies Reviewed:    Studies reviewed were summarized above. The additional studies were reviewed today:  2D echo 07/14/2023: 1. Left ventricular ejection fraction, by estimation, is 65 to 70%. Left  ventricular ejection fraction by PLAX is 73 %. The left ventricle has  normal function. The left ventricle has no regional wall motion  abnormalities. There is mild left ventricular  hypertrophy. Left ventricular diastolic parameters were normal.   2. Right ventricular systolic function is normal. The right ventricular  size is normal. There is normal pulmonary artery  systolic pressure. The  estimated right ventricular systolic pressure is 27.0 mmHg.   3. The mitral valve is normal in structure. No evidence of mitral valve  regurgitation. No evidence of mitral stenosis.   4. The aortic valve has an indeterminant number of cusps. Aortic valve  regurgitation is not visualized. No aortic stenosis is present.   5. The inferior vena cava is normal in size with greater than 50%  respiratory variability, suggesting right atrial pressure of 3 mmHg.  __________  2D echo 10/26/2023: 1. Left ventricular ejection fraction, by estimation, is 55 to 60%. The  left ventricle has normal function. The left ventricle has no regional  wall motion abnormalities. Left ventricular diastolic parameters were  normal.   2. Right ventricular systolic function is normal. The right ventricular  size is mildly enlarged. Mildly increased right ventricular wall  thickness. Tricuspid regurgitation signal is inadequate for assessing PA  pressure.   3. Left atrial size was mildly dilated.   4. Right atrial size was mildly dilated.   5. The mitral valve was not well visualized. No evidence of mitral valve  regurgitation. No evidence of mitral stenosis.   6. The aortic valve was not well visualized. Aortic valve regurgitation  is not visualized. No aortic stenosis is present.   7. Pulmonic valve regurgitation not well-assessed.   8. The inferior vena cava is normal in size with greater than 50%  respiratory variability, suggesting right atrial pressure of 3 mmHg.   9. Cannot exclude a small PFO.    EKG:  EKG is ordered today.  The EKG ordered today demonstrates ***  Recent Labs: 07/26/2023: ALT 33 08/03/2023: Hemoglobin 12.2; Platelets 275 08/05/2023: Magnesium  2.0 09/08/2023: B Natriuretic Peptide 73.9 10/18/2023: BUN 9; Creatinine, Ser 0.91; Potassium 4.2; Sodium 144  Recent Lipid Panel    Component Value Date/Time   CHOL 130 04/08/2016 0814   TRIG 64 07/26/2023 0558   HDL 43.70  04/08/2016 0814   CHOLHDL 3 04/08/2016 0814   VLDL 15.8 04/08/2016 0814   LDLCALC 71 04/08/2016 0814    PHYSICAL EXAM:    VS:  There were no vitals taken for this visit.  BMI: There is no height or weight on file to calculate BMI.  Physical Exam  Wt Readings from Last 3 Encounters:  11/16/23 (!) 351 lb 12.8 oz (159.6 kg)  10/27/23 (!) 361 lb 9.6 oz (164 kg)  10/27/23 (!) 361 lb 9.6 oz (164 kg)     ASSESSMENT & PLAN:   Dyspnea: ***.  He has undergone echocardiogram in 06/2023 and 10/2023 that have demonstrated preserved LV systolic function, normal LV diastolic function with study in 06/2021 showing normal RVSP estimated at 27 mmHg with most recent echo from earlier this month showing normal RV  systolic function with mildly enlarged ventricular cavity size and mildly increased wall thickness.  ***  Pulmonary scarring with possible pulmonary fibrosis with possible overlapping COPD and asthma: ***  Obesity with untreated OSA: ***  Right upper extremity DVT: Felt to be in the setting of PICC line status post therapy with apixaban  with most recent upper extremity venous duplex from 11/16/2023 showing no further evidence of DVT.  Awaiting lower extremity venous duplex to ensure no thromboembolic issues in the legs as well prior to discontinuation of anticoagulation.  Managed by vascular surgery.  Venous insufficiency with varicose veins: Followed by vascular surgery with recommendation for compression socks, leg elevation, and behavioral modification.   {Are you ordering a CV Procedure (e.g. stress test, cath, DCCV, TEE, etc)?   Press F2        :[id number]}     Disposition: F/u with Dr. Darliss or an APP in ***.   Medication Adjustments/Labs and Tests Ordered: Current medicines are reviewed at length with the patient today.  Concerns regarding medicines are outlined above. Medication changes, Labs and Tests ordered today are summarized above and listed in the Patient Instructions  accessible in Encounters.   Signed, Bernardino Bring, PA-C 11/22/2023 10:33 AM     [facility] [place] [address] [place], [place] [address] [phone]

## 2023-11-23 ENCOUNTER — Ambulatory Visit: Payer: MEDICAID | Admitting: Physician Assistant

## 2023-11-26 ENCOUNTER — Encounter: Payer: Self-pay | Admitting: Internal Medicine

## 2023-11-30 ENCOUNTER — Ambulatory Visit: Payer: MEDICAID | Attending: Internal Medicine | Admitting: Internal Medicine

## 2023-11-30 ENCOUNTER — Other Ambulatory Visit: Payer: MEDICAID

## 2023-12-10 ENCOUNTER — Telehealth: Payer: Self-pay | Admitting: Student in an Organized Health Care Education/Training Program

## 2023-12-10 NOTE — Telephone Encounter (Signed)
 Dr. Isadora saw the patient on 10/27/23 and placed order for cpap supplies and to adjust cpap pressure. We sent the order to [facility] but because we didn't have the sleep study they couldn't help. I spoke with the patient and he stated that if we couldn't find the sleep study he may need to do another one. I have checked local DMEs to see if the had a copy of sleep study. I even called some other providers asking them to check no luck with finding sleep study

## 2023-12-13 ENCOUNTER — Other Ambulatory Visit: Payer: Self-pay | Admitting: Adult Health

## 2023-12-13 ENCOUNTER — Ambulatory Visit
Admission: RE | Admit: 2023-12-13 | Discharge: 2023-12-13 | Disposition: A | Discharge status: Home / Self Care | Payer: MEDICAID | Attending: Adult Health | Admitting: Adult Health

## 2023-12-13 ENCOUNTER — Ambulatory Visit
Admission: RE | Admit: 2023-12-13 | Discharge: 2023-12-13 | Disposition: A | Discharge status: Home / Self Care | Payer: MEDICAID | Source: Ambulatory Visit | Attending: Adult Health | Admitting: Adult Health

## 2023-12-13 ENCOUNTER — Encounter: Payer: Self-pay | Admitting: Adult Health

## 2023-12-13 DIAGNOSIS — J069 Acute upper respiratory infection, unspecified: Secondary | ICD-10-CM | POA: Diagnosis present

## 2023-12-21 ENCOUNTER — Ambulatory Visit (INDEPENDENT_AMBULATORY_CARE_PROVIDER_SITE_OTHER): Payer: MEDICAID | Admitting: Vascular Surgery

## 2023-12-21 ENCOUNTER — Ambulatory Visit (INDEPENDENT_AMBULATORY_CARE_PROVIDER_SITE_OTHER): Payer: MEDICAID

## 2023-12-30 ENCOUNTER — Ambulatory Visit: Payer: MEDICAID | Admitting: Neurology

## 2023-12-30 ENCOUNTER — Telehealth: Payer: Self-pay | Admitting: Neurology

## 2023-12-30 ENCOUNTER — Encounter: Payer: Self-pay | Admitting: Neurology

## 2023-12-30 NOTE — Telephone Encounter (Signed)
 Patient has had two NP no shows on 11/15/23 and 12/30/23

## 2023-12-31 NOTE — Telephone Encounter (Signed)
 Can you review please and he has had 2 new-patient no shows then recommend dismissal. Thank you

## 2024-01-04 ENCOUNTER — Ambulatory Visit: Payer: MEDICAID | Admitting: Gastroenterology

## 2024-01-04 ENCOUNTER — Encounter: Payer: Self-pay | Admitting: Gastroenterology

## 2024-01-04 VITALS — BP 124/68 | HR 79 | Ht 74.0 in | Wt 368.0 lb

## 2024-01-04 DIAGNOSIS — K625 Hemorrhage of anus and rectum: Secondary | ICD-10-CM

## 2024-01-04 DIAGNOSIS — R194 Change in bowel habit: Secondary | ICD-10-CM | POA: Diagnosis not present

## 2024-01-04 DIAGNOSIS — R1084 Generalized abdominal pain: Secondary | ICD-10-CM | POA: Diagnosis not present

## 2024-01-04 NOTE — Progress Notes (Signed)
 01/04/2024 KWAMAINE CUPPETT [id number] 1966-04-06   HISTORY OF PRESENT ILLNESS: This is a 58 year old male who is new to our practice.  He is here today with complaints of rectal bleeding and change in bowel habits.  He had an elective hernia surgery back in March.  He ended up having complications with that surgery and apparently they hit his mesh that was in there from previous which caused some bleeding issues.  Then a few days later while in the hospital he had wound dehiscence and evisceration.  Had to go back to the OR again.  Ended up spending 3 weeks in the hospital.  He tells me that since that time he is been having rectal bleeding.  He showed me a picture.  This is a small amount of red blood mixed in with solid looking stool.  He says he also sees it on the toilet paper and sometimes into the toilet bowl by itself.  He says his bowel habits have been different as well.  He says that he will not have a bowel movement for couple of days and then will have diarrhea several times in a day.  Reports ongoing abdominal pain down the center of his abdomen.  Pain is not necessarily worsened by eating.  Pain does not affect his daily activities.  Hemoglobin was 16 grams when it was checked in July by his PCP and iron studies were within normal limits.  B12 and folate were normal as well.  He tells me that his last colonoscopy was well over 10 years ago at [facility].   Past Medical History:  Diagnosis Date   Asthma    as a child   Bronchitis 03/2016   pt has recovered from bronchitis   Chickenpox    Depression    Frequent headaches    GERD (gastroesophageal reflux disease)    when I was younger, better now.   Hypertension    Migraines    history of, not now   OSA (obstructive sleep apnea)    Past Surgical History:  Procedure Laterality Date   APPENDECTOMY  1977   HERNIA REPAIR     HYDROCELE EXCISION Right 06/01/2016   Procedure: HYDROCELECTOMY ADULT;  Surgeon: Rosina Riis, MD;  Location: [facility];  Service: Urology;  Laterality: Right;   knee Left 1994   KNEE ARTHROSCOPY Right 1992   LAPAROTOMY N/A 07/13/2023   Procedure: LAPAROTOMY, EXPLORATORY;  Surgeon: Tye Millet, DO;  Location: [facility];  Service: General;  Laterality: N/A;  Pink pad for case is needed possilble open   LAPAROTOMY N/A 07/17/2023   Procedure: LAPAROTOMY, EXPLORATORY, ABDOMINAL WASHOUT WITH CLOSURE;  Surgeon: Desiderio Schanz, MD;  Location: [facility];  Service: General;  Laterality: N/A;   UMBILICAL HERNIA REPAIR  2013   VASECTOMY N/A 06/01/2016   Procedure: VASECTOMY;  Surgeon: Rosina Riis, MD;  Location: [facility] OR[facility];  Service: Urology;  Laterality: N/A;    reports that he has quit smoking. His smoking use included cigarettes. He has never used smokeless tobacco. He reports that he does not currently use alcohol after a past usage of about 4.0 - 6.0 standard drinks of alcohol per week. He reports that he does not use drugs. family history includes Alcohol abuse in his father; Arthritis in his maternal grandmother; Breast cancer in his maternal grandmother; Colon cancer in his maternal grandmother; Heart attack in his paternal grandmother; Hyperlipidemia in his father, maternal grandfather, paternal grandfather, and paternal grandmother; Hypertension in  his father, maternal grandfather, and paternal grandfather; Ovarian cancer in his maternal grandmother and mother; Prostate cancer in his maternal uncle. Allergies  Allergen Reactions   Sulfa Antibiotics Anaphylaxis and Hives   Whole Blood Other (See Comments)    Recently had a blood transfusion that resulted in systemic sepsis and organ failure.      Outpatient Encounter Medications as of 01/04/2024  Medication Sig   acetaminophen  (TYLENOL ) 325 MG tablet Take 2 tablets (650 mg total) by mouth every 6 (six) hours as needed for mild pain (pain score 1-3), fever or headache.   Cholecalciferol (D 1000) 25 MCG (1000 UT) capsule Take 5,000 Units  by mouth daily.   omeprazole (PRILOSEC) 40 MG capsule Take 40 mg by mouth daily.   potassium chloride  (MICRO-K ) 10 MEQ CR capsule Take 1 capsule (10 mEq total) by mouth daily.   torsemide  (DEMADEX ) 20 MG tablet Take 1 tablet (20 mg total) by mouth 2 (two) times daily.   VENTOLIN  HFA 108 (90 Base) MCG/ACT inhaler Inhale 1 puff into the lungs every 4 (four) hours as needed.   [DISCONTINUED] apixaban  (ELIQUIS ) 5 MG TABS tablet Take 1 tablet (5 mg total) by mouth 2 (two) times daily.   No facility-administered encounter medications on file as of 01/04/2024.    REVIEW OF SYSTEMS  : All other systems reviewed and negative except where noted in the History of Present Illness.   PHYSICAL EXAM: BP 124/68   Pulse 79   Ht 6' 2 (1.88 m)   Wt (!) 368 lb (166.9 kg)   BMI 47.25 kg/m  General: Well developed white male in no acute distress Head: Normocephalic and atraumatic Eyes:  Sclerae anicteric, conjunctiva pink. Ears: Normal auditory acuity Lungs: Clear throughout to auscultation; no W/R/R. Heart: Regular rate and rhythm; no M/R/G. Abdomen: Soft, obese, non-distended.  BS present.  Scars noted on abdomen from his surgeries.  Minimal diffuse TTP. Musculoskeletal: Symmetrical with no gross deformities  Skin: No lesions on visible extremities Extremities: No edema  Neurological: Alert oriented x 4, grossly non-focal Psychological:  Alert and cooperative. Normal mood and affect  ASSESSMENT AND PLAN: *Rectal bleeding: Has been experiencing red rectal bleeding mixed in with the stool and on the toilet paper, occasionally in the bowl itself since about March or April.  Last colonoscopy well over 10 years ago.  Do not have those records.  Hemoglobin was 16 g in July and iron studies look good.  Will need to have colonoscopy.  His echo looks good.  He does have an appointment with cardiology on Friday though to discuss right heart cath in order to check some pulmonary pressures, etc.  I will send  cardiology a message I will wait to schedule colonoscopy until we hear back from them to determine his next steps in evaluation from their standpoint as well. *Change in bowel habits: Also reports a change in bowel habits for about the same amount of time.  Has alternating constipation and diarrhea.  Recommend he try Benefiber starting with 2 teaspoons mixed in 6 to 8 ounces of liquid daily. *Chronic abdominal pain: Has had chronic mid abdominal pain since his surgery.  Suspect this is related to nerve damage, scar tissue, mesh, etc. related to the surgery.  CC:  Hamrick, Maura L, MD

## 2024-01-04 NOTE — Patient Instructions (Signed)
 Start Benefiber or Citrucel 2 teaspoons in 8 ounces of liquid daily.  Will call back to schedule procedures after [name], [name] speaks with cardiology.   _______________________________________________________  If your blood pressure at your visit was 140/90 or greater, please contact your primary care physician to follow up on this.  _______________________________________________________  If you are age 56 or older, your body mass index should be between 23-30. Your Body mass index is 47.25 kg/m. If this is out of the aforementioned range listed, please consider follow up with your Primary Care Provider.  If you are age 41 or younger, your body mass index should be between 19-25. Your Body mass index is 47.25 kg/m. If this is out of the aformentioned range listed, please consider follow up with your Primary Care Provider.   ________________________________________________________  The [name] providers would like to encourage you to use MYCHART to communicate with providers for non-urgent requests or questions.  Due to long hold times on the telephone, sending your provider a message by [facility] may be a faster and more efficient way to get a response.  Please allow 48 business hours for a response.  Please remember that this is for non-urgent requests.  _______________________________________________________  [name] is using a team-based approach to care.  Your team is made up of your doctor and two to three APPS. Our APPS (Nurse Practitioners and Physician Assistants) work with your physician to ensure care continuity for you. They are fully qualified to address your health concerns and develop a treatment plan. They communicate directly with your gastroenterologist to care for you. Seeing the Advanced Practice Practitioners on your physician's team can help you by facilitating care more promptly, often allowing for earlier appointments, access to diagnostic testing,  procedures, and other specialty referrals.

## 2024-01-05 NOTE — Progress Notes (Signed)
 ____________________________________________________________  Attending physician addendum:  Thank you for sending this case to me. I have reviewed the entire note and agree with the plan.  I additionally reviewed this patient's operative report from March as well as CT abdomen and pelvis reports from before and after the surgery.  He appears to have prominent mesenteric venous collaterals to explain his unexpected bleeding during the planned hernia repair.  Ultimately, neither of the inguinal hernias could be repaired after the extensive surgery to control the bleeding.  However, both of those hernias are fat-containing on the initial CT scan and are not reported to have colon or small bowel within the such that they would present difficulties passing the colonoscope.  I agree that his persistent abdominal pain is likely related to prolonged healing after this complicated surgery. He can be scheduled for his colonoscopy with me after cardiac clearance.  Victory Brand, MD  ____________________________________________________________

## 2024-01-06 NOTE — Progress Notes (Deleted)
 Cardiology Office Note    Date:  01/06/2024   ID:  [name], DOB 29-Aug-1965, MRN [id number]  PCP:  Stephanie Charlene CROME, MD  Cardiologist:  Redell Cave, MD  Electrophysiologist:  None   Chief Complaint: Dyspnea   History of Present Illness:   [name] is a 58 y.o. male with history of asthma, prior tobacco use of 30+ years, obstructive pulmonary disease with pulmonary scarring noted on high-resolution chest CT, OSA not on CPAP, aortic atherosclerosis, hepatic steatosis, and obesity who presents for evaluation of dyspnea at the request of pulmonology.  He was admitted to the hospital in 06/2023 following elective hernia surgery complicated by bleeding issues requiring electrocautery and ligature for hemostasis, dehiscence, and evisceration requiring repeat abdominal surgery requiring FFP and platelets due to intraoperative bleeding complicated by circulatory shock with acute hypoxic and hypercapnic respiratory failure treated for anaphylactic nonfebrile blood product reaction versus TRALI, with admission further complicated by acute right upper extremity DVT status post OAC therapy managed by vascular surgery, AKI, and electrolyte derangement.  Echo in 06/2023 showed an EF of 65 to 70%, no regional wall motion abnormalities, mild LVH, normal LV diastolic function parameters, normal RV systolic function, ventricular cavity size, and RVSP estimated at 27 mmHg, and no significant valvular abnormality with an estimated right atrial pressure of 3 mmHg.    Following admission he has noted ongoing shortness of breath.  High-resolution chest CT showed diffuse peribronchovascular coarsened ground glass and architectural distortion indicative of evolving postinfectious versus postinflammatory scarring and/or fibrosis.  He was evaluated by cardiology in 09/2023 with symptoms felt to be multifactorial including pulmonary scarring, untreated sleep apnea, and obesity.  Echo in 10/2023 showed an EF of  55 to 60%, no regional wall motion abnormalities, normal LV diastolic function parameters, normal RV systolic function and ventricular cavity size mildly increased RV wall thickness, TR signal inadequate for assessing RVSP, mild biatrial enlargement no significant valvular abnormality, and an estimated right atrial pressure of 3 mmHg, unable to exclude small PFO.  He has known OSA and has been nonadherent to CPAP.  He has undergone PFTs which demonstrated obstructive lung disease in 10/2023.  He has been evaluated by pulmonology for this with his dyspnea felt to be multifactorial including obstructive pulmonary disease and possible development of pulmonary hypertension with pulmonology recommending RHC.  More recently, he was evaluated by GI on 01/04/2024 with complaints of rectal bleeding and change in stool habits with recommendation for colonoscopy.  ***   Labs independently reviewed: 11/2023 - BUN 19, serum creatinine 1.32, potassium 4.6, albumin  3.5, AST/ALT normal, TSH 0.225 Hgb 13.8, PLT 12, BNP 28 07/2023 - magnesium  2.0 03/2016 - TC 130, TG 79, HDL 43, LDL 71  Past Medical History:  Diagnosis Date   Asthma    as a child   Bronchitis 03/2016   pt has recovered from bronchitis   Chickenpox    Depression    Frequent headaches    GERD (gastroesophageal reflux disease)    when I was younger, better now.   Hypertension    Migraines    history of, not now   OSA (obstructive sleep apnea)     Past Surgical History:  Procedure Laterality Date   APPENDECTOMY  1977   HERNIA REPAIR     HYDROCELE EXCISION Right 06/01/2016   Procedure: HYDROCELECTOMY ADULT;  Surgeon: Rosina Riis, MD;  Location: [facility];  Service: Urology;  Laterality: Right;   knee Left 1994   KNEE ARTHROSCOPY  Right 1992   LAPAROTOMY N/A 07/13/2023   Procedure: LAPAROTOMY, EXPLORATORY;  Surgeon: Tye Millet, DO;  Location: [facility];  Service: General;  Laterality: N/A;  Pink pad for case is needed possilble open    LAPAROTOMY N/A 07/17/2023   Procedure: LAPAROTOMY, EXPLORATORY, ABDOMINAL WASHOUT WITH CLOSURE;  Surgeon: Desiderio Schanz, MD;  Location: [facility];  Service: General;  Laterality: N/A;   UMBILICAL HERNIA REPAIR  2013   VASECTOMY N/A 06/01/2016   Procedure: VASECTOMY;  Surgeon: Rosina Riis, MD;  Location: [facility];  Service: Urology;  Laterality: N/A;    Current Medications: No outpatient medications have been marked as taking for the 01/07/24 encounter (Appointment) with Abigail Bernardino HERO, PA-C.    Allergies:   Sulfa antibiotics and Whole blood   Social History   Socioeconomic History   Marital status: Widowed    Spouse name: Not on file   Number of children: Not on file   Years of education: Not on file   Highest education level: Not on file  Occupational History   Not on file  Tobacco Use   Smoking status: Former    Current packs/day: 0.50    Types: Cigarettes   Smokeless tobacco: Never   Tobacco comments:    March 11 2016  Vaping Use   Vaping status: Never Used  Substance and Sexual Activity   Alcohol use: Not Currently    Alcohol/week: 4.0 - 6.0 standard drinks of alcohol    Types: 4 - 6 Shots of liquor per week   Drug use: No   Sexual activity: Not on file  Other Topics Concern   Not on file  Social History Narrative   Single.   2 children.   Works independently as Building services engineer.   Enjoys reading, watching tv.   Social Drivers of Corporate investment banker Strain: Low Risk  (10/18/2023)   Received from [facility]   Overall Financial Resource Strain (CARDIA)    Difficulty of Paying Living Expenses: Not hard at all  Food Insecurity: No Food Insecurity (10/18/2023)   [facility]   Hunger Vital Sign    Within the past 12 months, you worried that your food would run out before you got the money to buy more.: Never true    Within the past 12 months, the food you bought just didn't last and you didn't have money to get more.:  Never true  Transportation Needs: No Transportation Needs (10/18/2023)   Received from [facility] - Transportation    In the past 12 months, has lack of transportation kept you from medical appointments or from getting medications?: No    Lack of Transportation (Non-Medical): No  Physical Activity: Not on file  Stress: Not on file  Social Connections: Not on file     Family History:  The patient's family history includes Alcohol abuse in his father; Arthritis in his maternal grandmother; Breast cancer in his maternal grandmother; Colon cancer in his maternal grandmother; Heart attack in his paternal grandmother; Hyperlipidemia in his father, maternal grandfather, paternal grandfather, and paternal grandmother; Hypertension in his father, maternal grandfather, and paternal grandfather; Ovarian cancer in his maternal grandmother and mother; Prostate cancer in his maternal uncle. There is no history of Bladder Cancer or Kidney disease.  ROS:   12-point review of systems is negative unless otherwise noted in the HPI.   EKGs/Labs/Other Studies Reviewed:    Studies reviewed were summarized above. The  additional studies were reviewed today:  2D echo 07/14/2023: 1. Left ventricular ejection fraction, by estimation, is 65 to 70%. Left  ventricular ejection fraction by PLAX is 73 %. The left ventricle has  normal function. The left ventricle has no regional wall motion  abnormalities. There is mild left ventricular  hypertrophy. Left ventricular diastolic parameters were normal.   2. Right ventricular systolic function is normal. The right ventricular  size is normal. There is normal pulmonary artery systolic pressure. The  estimated right ventricular systolic pressure is 27.0 mmHg.   3. The mitral valve is normal in structure. No evidence of mitral valve  regurgitation. No evidence of mitral stenosis.   4. The aortic valve has an indeterminant number of cusps.  Aortic valve  regurgitation is not visualized. No aortic stenosis is present.   5. The inferior vena cava is normal in size with greater than 50%  respiratory variability, suggesting right atrial pressure of 3 mmHg.  __________  2D echo 10/26/2023: 1. Left ventricular ejection fraction, by estimation, is 55 to 60%. The  left ventricle has normal function. The left ventricle has no regional  wall motion abnormalities. Left ventricular diastolic parameters were  normal.   2. Right ventricular systolic function is normal. The right ventricular  size is mildly enlarged. Mildly increased right ventricular wall  thickness. Tricuspid regurgitation signal is inadequate for assessing PA  pressure.   3. Left atrial size was mildly dilated.   4. Right atrial size was mildly dilated.   5. The mitral valve was not well visualized. No evidence of mitral valve  regurgitation. No evidence of mitral stenosis.   6. The aortic valve was not well visualized. Aortic valve regurgitation  is not visualized. No aortic stenosis is present.   7. Pulmonic valve regurgitation not well-assessed.   8. The inferior vena cava is normal in size with greater than 50%  respiratory variability, suggesting right atrial pressure of 3 mmHg.   9. Cannot exclude a small PFO.    EKG:  EKG is ordered today.  The EKG ordered today demonstrates ***  Recent Labs: 07/26/2023: ALT 33 08/03/2023: Hemoglobin 12.2; Platelets 275 08/05/2023: Magnesium  2.0 09/08/2023: B Natriuretic Peptide 73.9 10/18/2023: BUN 9; Creatinine, Ser 0.91; Potassium 4.2; Sodium 144  Recent Lipid Panel    Component Value Date/Time   CHOL 130 04/08/2016 0814   TRIG 64 07/26/2023 0558   HDL 43.70 04/08/2016 0814   CHOLHDL 3 04/08/2016 0814   VLDL 15.8 04/08/2016 0814   LDLCALC 71 04/08/2016 0814    PHYSICAL EXAM:    VS:  There were no vitals taken for this visit.  BMI: There is no height or weight on file to calculate BMI.  Physical Exam  Wt  Readings from Last 3 Encounters:  01/04/24 (!) 368 lb (166.9 kg)  11/16/23 (!) 351 lb 12.8 oz (159.6 kg)  10/27/23 (!) 361 lb 9.6 oz (164 kg)     ASSESSMENT & PLAN:   Dyspnea: Review of CT imaging does not demonstrate significant coronary artery calcification.  Obstructive pulmonary disease/pulmonary scarring/asthma/prior tobacco use:  OSA:  Aortic atherosclerosis/HLD: No recent lipid panel.  Rectal bleeding:  Preprocedure cardiac risk stratification: GI is requesting a colonoscopy for evaluation of change in bowel habits and rectal bleeding.   {Are you ordering a CV Procedure (e.g. stress test, cath, DCCV, TEE, etc)?   Press F2        :[id number]}     Disposition: F/u with Dr. Darliss or an APP  in ***.   Medication Adjustments/Labs and Tests Ordered: Current medicines are reviewed at length with the patient today.  Concerns regarding medicines are outlined above. Medication changes, Labs and Tests ordered today are summarized above and listed in the Patient Instructions accessible in Encounters.   Signed, Bernardino Bring, PA-C 01/06/2024 12:58 PM     [facility] [place] [address] [place], [place] [address] [phone]

## 2024-01-07 ENCOUNTER — Ambulatory Visit: Payer: MEDICAID | Admitting: Physician Assistant

## 2024-01-10 NOTE — Progress Notes (Deleted)
 Cardiology Office Note    Date:  01/10/2024   ID:  [name], DOB 11-17-1965, MRN [id number]  PCP:  Stephanie Charlene CROME, MD  Cardiologist:  Redell Cave, MD  Electrophysiologist:  None   Chief Complaint: Dyspnea   History of Present Illness:   [name] is a 58 y.o. male with history of asthma, prior tobacco use of 30+ years, obstructive pulmonary disease with pulmonary scarring noted on high-resolution chest CT, OSA not on CPAP, aortic atherosclerosis, hepatic steatosis, and obesity who presents for evaluation of dyspnea at the request of pulmonology.  He was admitted to the hospital in 06/2023 following elective hernia surgery complicated by bleeding issues requiring electrocautery and ligature for hemostasis, dehiscence, and evisceration requiring repeat abdominal surgery requiring FFP and platelets due to intraoperative bleeding complicated by circulatory shock with acute hypoxic and hypercapnic respiratory failure treated for anaphylactic nonfebrile blood product reaction versus TRALI, with admission further complicated by acute right upper extremity DVT status post OAC therapy managed by vascular surgery, AKI, and electrolyte derangement.  Echo in 06/2023 showed an EF of 65 to 70%, no regional wall motion abnormalities, mild LVH, normal LV diastolic function parameters, normal RV systolic function, ventricular cavity size, and RVSP estimated at 27 mmHg, and no significant valvular abnormality with an estimated right atrial pressure of 3 mmHg.    Following admission he has noted ongoing shortness of breath.  High-resolution chest CT showed diffuse peribronchovascular coarsened ground glass and architectural distortion indicative of evolving postinfectious versus postinflammatory scarring and/or fibrosis.  He was evaluated by cardiology in 09/2023 with symptoms felt to be multifactorial including pulmonary scarring, untreated sleep apnea, and obesity.  Echo in 10/2023 showed an EF of  55 to 60%, no regional wall motion abnormalities, normal LV diastolic function parameters, normal RV systolic function and ventricular cavity size mildly increased RV wall thickness, TR signal inadequate for assessing RVSP, mild biatrial enlargement no significant valvular abnormality, and an estimated right atrial pressure of 3 mmHg, unable to exclude small PFO.  He has known OSA and has been nonadherent to CPAP.  He has undergone PFTs which demonstrated obstructive lung disease in 10/2023.  He has been evaluated by pulmonology for this with his dyspnea felt to be multifactorial including obstructive pulmonary disease and possible development of pulmonary hypertension with pulmonology recommending RHC.  More recently, he was evaluated by GI on 01/04/2024 with complaints of rectal bleeding and change in stool habits with recommendation for colonoscopy.  ***   Labs independently reviewed: 11/2023 - BUN 19, serum creatinine 1.32, potassium 4.6, albumin  3.5, AST/ALT normal, TSH 0.225 Hgb 13.8, PLT 12, BNP 28 07/2023 - magnesium  2.0 03/2016 - TC 130, TG 79, HDL 43, LDL 71  Past Medical History:  Diagnosis Date   Asthma    as a child   Bronchitis 03/2016   pt has recovered from bronchitis   Chickenpox    Depression    Frequent headaches    GERD (gastroesophageal reflux disease)    when I was younger, better now.   Hypertension    Migraines    history of, not now   OSA (obstructive sleep apnea)     Past Surgical History:  Procedure Laterality Date   APPENDECTOMY  1977   HERNIA REPAIR     HYDROCELE EXCISION Right 06/01/2016   Procedure: HYDROCELECTOMY ADULT;  Surgeon: Rosina Riis, MD;  Location: [facility];  Service: Urology;  Laterality: Right;   knee Left 1994   KNEE ARTHROSCOPY  Right 1992   LAPAROTOMY N/A 07/13/2023   Procedure: LAPAROTOMY, EXPLORATORY;  Surgeon: Tye Millet, DO;  Location: [facility];  Service: General;  Laterality: N/A;  Pink pad for case is needed possilble open    LAPAROTOMY N/A 07/17/2023   Procedure: LAPAROTOMY, EXPLORATORY, ABDOMINAL WASHOUT WITH CLOSURE;  Surgeon: Desiderio Schanz, MD;  Location: [facility];  Service: General;  Laterality: N/A;   UMBILICAL HERNIA REPAIR  2013   VASECTOMY N/A 06/01/2016   Procedure: VASECTOMY;  Surgeon: Rosina Riis, MD;  Location: [facility];  Service: Urology;  Laterality: N/A;    Current Medications: No outpatient medications have been marked as taking for the 01/11/24 encounter (Appointment) with Abigail Bernardino HERO, PA-C.    Allergies:   Sulfa antibiotics and Whole blood   Social History   Socioeconomic History   Marital status: Widowed    Spouse name: Not on file   Number of children: Not on file   Years of education: Not on file   Highest education level: Not on file  Occupational History   Not on file  Tobacco Use   Smoking status: Former    Current packs/day: 0.50    Types: Cigarettes   Smokeless tobacco: Never   Tobacco comments:    March 11 2016  Vaping Use   Vaping status: Never Used  Substance and Sexual Activity   Alcohol use: Not Currently    Alcohol/week: 4.0 - 6.0 standard drinks of alcohol    Types: 4 - 6 Shots of liquor per week   Drug use: No   Sexual activity: Not on file  Other Topics Concern   Not on file  Social History Narrative   Single.   2 children.   Works independently as Building services engineer.   Enjoys reading, watching tv.   Social Drivers of Corporate investment banker Strain: Low Risk  (10/18/2023)   Received from [facility]   Overall Financial Resource Strain (CARDIA)    Difficulty of Paying Living Expenses: Not hard at all  Food Insecurity: No Food Insecurity (10/18/2023)   Received from [facility]   Hunger Vital Sign    Within the past 12 months, you worried that your food would run out before you got the money to buy more.: Never true    Within the past 12 months, the food you bought just didn't last and you didn't have money to get more.:  Never true  Transportation Needs: No Transportation Needs (10/18/2023)   Received from [facility] - Transportation    In the past 12 months, has lack of transportation kept you from medical appointments or from getting medications?: No    Lack of Transportation (Non-Medical): No  Physical Activity: Not on file  Stress: Not on file  Social Connections: Not on file     Family History:  The patient's family history includes Alcohol abuse in his father; Arthritis in his maternal grandmother; Breast cancer in his maternal grandmother; Colon cancer in his maternal grandmother; Heart attack in his paternal grandmother; Hyperlipidemia in his father, maternal grandfather, paternal grandfather, and paternal grandmother; Hypertension in his father, maternal grandfather, and paternal grandfather; Ovarian cancer in his maternal grandmother and mother; Prostate cancer in his maternal uncle. There is no history of Bladder Cancer or Kidney disease.  ROS:   12-point review of systems is negative unless otherwise noted in the HPI.   EKGs/Labs/Other Studies Reviewed:    Studies reviewed were summarized above. The  additional studies were reviewed today:  2D echo 07/14/2023: 1. Left ventricular ejection fraction, by estimation, is 65 to 70%. Left  ventricular ejection fraction by PLAX is 73 %. The left ventricle has  normal function. The left ventricle has no regional wall motion  abnormalities. There is mild left ventricular  hypertrophy. Left ventricular diastolic parameters were normal.   2. Right ventricular systolic function is normal. The right ventricular  size is normal. There is normal pulmonary artery systolic pressure. The  estimated right ventricular systolic pressure is 27.0 mmHg.   3. The mitral valve is normal in structure. No evidence of mitral valve  regurgitation. No evidence of mitral stenosis.   4. The aortic valve has an indeterminant number of cusps.  Aortic valve  regurgitation is not visualized. No aortic stenosis is present.   5. The inferior vena cava is normal in size with greater than 50%  respiratory variability, suggesting right atrial pressure of 3 mmHg.  __________  2D echo 10/26/2023: 1. Left ventricular ejection fraction, by estimation, is 55 to 60%. The  left ventricle has normal function. The left ventricle has no regional  wall motion abnormalities. Left ventricular diastolic parameters were  normal.   2. Right ventricular systolic function is normal. The right ventricular  size is mildly enlarged. Mildly increased right ventricular wall  thickness. Tricuspid regurgitation signal is inadequate for assessing PA  pressure.   3. Left atrial size was mildly dilated.   4. Right atrial size was mildly dilated.   5. The mitral valve was not well visualized. No evidence of mitral valve  regurgitation. No evidence of mitral stenosis.   6. The aortic valve was not well visualized. Aortic valve regurgitation  is not visualized. No aortic stenosis is present.   7. Pulmonic valve regurgitation not well-assessed.   8. The inferior vena cava is normal in size with greater than 50%  respiratory variability, suggesting right atrial pressure of 3 mmHg.   9. Cannot exclude a small PFO.    EKG:  EKG is ordered today.  The EKG ordered today demonstrates ***  Recent Labs: 07/26/2023: ALT 33 08/03/2023: Hemoglobin 12.2; Platelets 275 08/05/2023: Magnesium  2.0 09/08/2023: B Natriuretic Peptide 73.9 10/18/2023: BUN 9; Creatinine, Ser 0.91; Potassium 4.2; Sodium 144  Recent Lipid Panel    Component Value Date/Time   CHOL 130 04/08/2016 0814   TRIG 64 07/26/2023 0558   HDL 43.70 04/08/2016 0814   CHOLHDL 3 04/08/2016 0814   VLDL 15.8 04/08/2016 0814   LDLCALC 71 04/08/2016 0814    PHYSICAL EXAM:    VS:  There were no vitals taken for this visit.  BMI: There is no height or weight on file to calculate BMI.  Physical Exam  Wt  Readings from Last 3 Encounters:  01/04/24 (!) 368 lb (166.9 kg)  11/16/23 (!) 351 lb 12.8 oz (159.6 kg)  10/27/23 (!) 361 lb 9.6 oz (164 kg)     ASSESSMENT & PLAN:   Dyspnea: Review of CT imaging does not demonstrate significant coronary artery calcification.  Obstructive pulmonary disease/pulmonary scarring/asthma/prior tobacco use:  OSA:  Aortic atherosclerosis/HLD: No recent lipid panel.  Rectal bleeding:  Preprocedure cardiac risk stratification: GI is requesting a colonoscopy for evaluation of change in bowel habits and rectal bleeding.   {Are you ordering a CV Procedure (e.g. stress test, cath, DCCV, TEE, etc)?   Press F2        :[id number]}     Disposition: F/u with Dr. Darliss or an APP  in ***.   Medication Adjustments/Labs and Tests Ordered: Current medicines are reviewed at length with the patient today.  Concerns regarding medicines are outlined above. Medication changes, Labs and Tests ordered today are summarized above and listed in the Patient Instructions accessible in Encounters.   Signed, Bernardino Bring, PA-C 01/10/2024 5:38 PM     [facility] [place] [address] [place], [place] [address] [phone]

## 2024-01-11 ENCOUNTER — Ambulatory Visit: Payer: MEDICAID | Admitting: Physician Assistant

## 2024-01-19 ENCOUNTER — Ambulatory Visit: Payer: MEDICAID | Admitting: Neurology

## 2024-01-20 NOTE — Progress Notes (Unsigned)
 Cardiology Office Note:    Date:  01/21/2024   ID:  [name], DOB [birth year], MRN [id number]  PCP:  Stephanie Charlene CROME, MD   [facility] Providers Cardiologist:  Redell Cave, MD     Referring MD: Stephanie Charlene CROME, MD   Chief complaint: 82-month follow-up  History of Present Illness:    [name] is a 58 y.o. male with a hx of  asthma, former smoker x 30+ years, right upper extremity DVT recently off Eliquis , morbid obesity, untreated OSA who presents to the office for follow-up after previous visit due to shortness of breath and edema.   Admitted to the hospital 06/2023 for inguinal hernia repair.  Hospital course complicated by bleeding, requiring transfusion and emergent abdominal surgery.  Developed right upper extremity DVT due to PICC line placement, currently on Eliquis  for this.  Echo obtained at that time showed normal systolic and diastolic function.  Was followed up with Dr. Cave on 09/30/2023 for SOB and edema.  Was reported to have no anginal symptoms at time of visit, but if they were to develop we will consider cardiac catheterization.  Switched patient from Lasix  to torsemide , and recommended follow-up for his untreated OSA, instructed to follow-up with cardiology in 6 weeks, visits were scheduled for 7/17 and 7/18 but both were canceled.  Was evaluated by [facility] pulmonology on 10/27/2023, etiology of his shortness of breath was considered multifactorial considering his mild and nonreversible obstruction on PFTs and considering his improvement in symptoms following increased diuresis.  Pulmonology mentions concern for pulmonary hypertension possibly secondary to HFpEF as well as untreated OSA, recommended proceeding with right heart catheterization to further evaluate his filling pressures and assess for pre versus postcapillary pulmonary hypertension.  CPAP machine ordered.  Echo on 10/26/2023 ordered by pulmonology shows LVEF 55-60%, normal LV  function, no RWMA, normal diastolic parameters, mildly enlarged right ventricle with mildly increased RV wall thickness.  LA and RA mildly dilated.  Normal mitral valve.  Normal aortic valve.  Pulmonic valve regurgitation not well assessed.  Right atrial pressure 3 mmHg.  Cannot exclude small PFO.  Follow-up on 11/16/2023 with vascular showed complete resolution of upper extremity DVT.  Duplex of bilateral lower extremities were ordered prior to cessation of anticoagulation to rule out LE DVT considering significant lower extremity varicose veins and recent hospitalization within last 6 months.  LE DVT study scheduled for 01/25/2024.  01/04/2024 seen by GI specialist for rectal bleeding, recommended colonoscopy. Will need cardiac clearance prior to upcoming colonoscopy.  Presents to clinic today doing well. He denies chest pain, palpitations, orthopnea, n, v, dizziness, syncope, weight gain, or early satiety. Reports continued DOE, no SOB at rest. Working with pulmonology on his breathing. Reports continued adherence to his torsemide , states he's having to urinate frequently and notices significant changes to his weight on a daily basis while he's on it. Bilateral leg edema improved while on torsemide . 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    Past Medical History:  Diagnosis Date   Asthma    as a child   Bronchitis 03/2016   pt has recovered from bronchitis   Chickenpox    Depression    Frequent headaches    GERD (gastroesophageal reflux disease)    when I was younger, better now.   Hypertension    Migraines    history of, not now  OSA (obstructive sleep apnea)     Past Surgical History:  Procedure Laterality Date   APPENDECTOMY  1977   HERNIA REPAIR     HYDROCELE EXCISION Right 06/01/2016   Procedure: HYDROCELECTOMY ADULT;  Surgeon: Rosina Riis, MD;  Location: [facility] ORS;  Service:  Urology;  Laterality: Right;   knee Left 1994   KNEE ARTHROSCOPY Right 1992   LAPAROTOMY N/A 07/13/2023   Procedure: LAPAROTOMY, EXPLORATORY;  Surgeon: Tye Millet, DO;  Location: [facility] ORS;  Service: General;  Laterality: N/A;  Pink pad for case is needed possilble open   LAPAROTOMY N/A 07/17/2023   Procedure: LAPAROTOMY, EXPLORATORY, ABDOMINAL WASHOUT WITH CLOSURE;  Surgeon: Desiderio Schanz, MD;  Location: [facility] ORS;  Service: General;  Laterality: N/A;   UMBILICAL HERNIA REPAIR  2013   VASECTOMY N/A 06/01/2016   Procedure: VASECTOMY;  Surgeon: Rosina Riis, MD;  Location: [facility] ORS;  Service: Urology;  Laterality: N/A;    Current Medications: Current Meds  Medication Sig   acetaminophen  (TYLENOL ) 325 MG tablet Take 2 tablets (650 mg total) by mouth every 6 (six) hours as needed for mild pain (pain score 1-3), fever or headache.   Cholecalciferol (D 1000) 25 MCG (1000 UT) capsule Take 5,000 Units by mouth daily.   omeprazole (PRILOSEC) 40 MG capsule Take 40 mg by mouth daily.   VENTOLIN  HFA 108 (90 Base) MCG/ACT inhaler Inhale 1 puff into the lungs every 4 (four) hours as needed.   [DISCONTINUED] potassium chloride  (MICRO-K ) 10 MEQ CR capsule Take 1 capsule (10 mEq total) by mouth daily.   [DISCONTINUED] torsemide  (DEMADEX ) 20 MG tablet Take 1 tablet (20 mg total) by mouth 2 (two) times daily.     Allergies:   Red blood cells, Sulfa antibiotics, and Whole blood   Social History   Socioeconomic History   Marital status: Widowed    Spouse name: Not on file   Number of children: Not on file   Years of education: Not on file   Highest education level: Not on file  Occupational History   Not on file  Tobacco Use   Smoking status: Former    Current packs/day: 0.50    Types: Cigarettes   Smokeless tobacco: Never   Tobacco comments:    March 11 2016  Vaping Use   Vaping status: Never Used  Substance and Sexual Activity   Alcohol use: Not Currently    Alcohol/week: 4.0 - 6.0  standard drinks of alcohol    Types: 4 - 6 Shots of liquor per week   Drug use: No   Sexual activity: Not on file  Other Topics Concern   Not on file  Social History Narrative   Single.   2 children.   Works independently as Building services engineer.   Enjoys reading, watching tv.   Social Drivers of Corporate investment banker Strain: Low Risk  (10/18/2023)   Received from [facility]   Overall Financial Resource Strain (CARDIA)    Difficulty of Paying Living Expenses: Not hard at all  Food Insecurity: No Food Insecurity (10/18/2023)   Received from [facility]   Hunger Vital Sign    Within the past 12 months, you worried that your food would run out before you got the money to buy more.: Never true    Within the past 12 months, the food you bought just didn't last and you didn't have money to get more.: Never true  Transportation Needs: No Transportation Needs (10/18/2023)   Received  [facility] - Transportation    In the past 12 months, has lack of transportation kept you from medical appointments or from getting medications?: No    Lack of Transportation (Non-Medical): No  Physical Activity: Not on file  Stress: Not on file  Social Connections: Not on file     Family History: The patient's family history includes Alcohol abuse in his father; Arthritis in his maternal grandmother; Breast cancer in his maternal grandmother; Colon cancer in his maternal grandmother; Heart attack in his paternal grandmother; Hyperlipidemia in his father, maternal grandfather, paternal grandfather, and paternal grandmother; Hypertension in his father, maternal grandfather, and paternal grandfather; Ovarian cancer in his maternal grandmother and mother; Prostate cancer in his maternal uncle. There is no history of Bladder Cancer or Kidney disease.  ROS:   Please see the history of present illness.    All other systems reviewed and are  negative.  EKGs/Labs/Other Studies Reviewed:    The following studies were reviewed today:  EKG Interpretation Date/Time:  Friday January 21 2024 10:33:15 EDT Ventricular Rate:  80 PR Interval:  160 QRS Duration:  88 QT Interval:  400 QTC Calculation: 461 R Axis:   55  Text Interpretation: Normal sinus rhythm Normal ECG When compared with ECG of 30-Sep-2023 10:21, No significant change was found Confirmed by Retta Pitcher [phone]) on 01/21/2024 10:41:33 AM    Recent Labs: 07/26/2023: ALT 33 08/03/2023: Hemoglobin 12.2; Platelets 275 08/05/2023: Magnesium  2.0 09/08/2023: B Natriuretic Peptide 73.9 10/18/2023: BUN 9; Creatinine, Ser 0.91; Potassium 4.2; Sodium 144  Recent Lipid Panel    Component Value Date/Time   CHOL 130 04/08/2016 0814   TRIG 64 07/26/2023 0558   HDL 43.70 04/08/2016 0814   CHOLHDL 3 04/08/2016 0814   VLDL 15.8 04/08/2016 0814   LDLCALC 71 04/08/2016 0814     Physical Exam:    VS:  BP 126/80 (BP Location: Left Arm, Patient Position: Sitting, Cuff Size: Large)   Pulse 80   Wt (!) 360 lb (163.3 kg)   SpO2 95%   BMI 46.22 kg/m     Wt Readings from Last 3 Encounters:  01/21/24 (!) 360 lb (163.3 kg)  01/04/24 (!) 368 lb (166.9 kg)  11/16/23 (!) 351 lb 12.8 oz (159.6 kg)     GEN:  Well nourished, well developed in no acute distress HEENT: Normal NECK: No JVD; No carotid bruits LYMPHATICS: No lymphadenopathy CARDIAC: RRR, no murmurs, rubs, gallops RESPIRATORY:  Clear to auscultation without rales, wheezing or rhonchi  ABDOMEN: Soft, non-tender, non-distended MUSCULOSKELETAL:  No edema; No deformity  SKIN: Warm and dry NEUROLOGIC:  Alert and oriented x 3 PSYCHIATRIC:  Normal affect   ASSESSMENT:    1. DOE (dyspnea on exertion)   2. Enlarged RV (right ventricle)    PLAN:    In order of problems listed above:  DOE RV enlargement and thickening  Denies CP, SOB at rest, orthopnea, palpitations  Do not believe LHC is necessary at this time  d/t lack of ischemic symptoms.  EKG: NSR 80 bpm Echo 10/26/2023 LVEF 55-60%, normal LV function, no RWMA, normal diastolic parameters. Mildly enlarged RV with mildly increased RV wall thickening, LA/RA mild dilatation, cannot exclude small PFO. Pulmonology recommends further evaluate his filling pressures and assess for pre versus postcapillary pulmonary hypertension  I agree with proceeding with RHC to allow for a more tailored treatment for patient's DOE Discussed the risks and benefits of RHC with patient, who is okay with  moving forward w/ procedure. Uncertain of PFO presence on echo. No history of strokes. Could consider further work up in future  Continue torsemide  20 mg BID Order BMP to check kidney function  Preprocedural surgical clearance Surgical risk evaluation for upcoming colonoscopy pending result of RHC Will likely require surgical clearance from pulmonology considering dyspnea If right heart pressures are moderately-severely elevated, will refer to advanced HF team for further management GI office will need to fax a formal request to our office for official cardiac recommendations.  OSA  Has not started using CPAP as he is waiting on pulmonology to adjust his CPAP settings.   Follow up with general cardiology in 3 months  Informed Consent   Shared Decision Making/Informed Consent The risks, including but not limited to, [bleeding or vascular complications (1 in 500), pneumothorax (1 in 1600), arrhythmia (1 in 1000) and death (1 in 5000)], benefits (diagnostic support and/or management of heart failure, pulmonary hypertension) and alternatives of a right heart catheterization were discussed in detail with [name] and he is willing to proceed.       Medication Adjustments/Labs and Tests Ordered: Current medicines are reviewed at length with the patient today.  Concerns regarding medicines are outlined above.  Orders Placed This Encounter  Procedures   Basic metabolic  panel with GFR   CBC   EKG 12-Lead   Meds ordered this encounter  Medications   potassium chloride  (MICRO-K ) 10 MEQ CR capsule    Sig: Take 1 capsule (10 mEq total) by mouth daily.    Dispense:  90 capsule    Refill:  3   torsemide  (DEMADEX ) 20 MG tablet    Sig: Take 1 tablet (20 mg total) by mouth 2 (two) times daily.    Dispense:  180 tablet    Refill:  3    Patient Instructions  Medication Instructions:  Your physician recommends that you continue on your current medications as directed. Please refer to the Current Medication list given to you today.   *If you need a refill on your cardiac medications before your next appointment, please call your pharmacy*  Lab Work: Your provider would like for you to have following labs drawn today BMP, CBC.   If you have labs (blood work) drawn today and your tests are completely normal, you will receive your results only by: MyChart Message (if you have MyChart) OR A paper copy in the mail If you have any lab test that is abnormal or we need to change your treatment, we will call you to review the results.  Testing/Procedures:  [place] A DEPT OF [facility] [place] [address] [place] [address] Dept: [phone] Loc: [phone]  [name]  01/21/2024  You are scheduled for a Cardiac Catheterization on Tuesday, October 7 with Dr. Lonni End.  1. Please arrive at the [facility] Entrance of [facility], [address], [place] [address] at 7:30 AM (This is 1 hour(s) prior to your procedure time).  Proceed to the Check-In Desk directly inside the entrance.  Procedure Parking: Use the entrance off of the [address] side of the hospital. Turn right upon entering and follow the driveway to parking that is directly in front of the Heart [facility] Vascular Center. There is no valet parking available at this entrance, however there  is an awning directly in front of the Heart & Vascular Center for drop off/ pick  up for patients.  Special note: Every effort is made to have your procedure done on time. Please understand that emergencies sometimes delay scheduled procedures.  2. Diet: Nothing to eat after midnight.   3. Hydration: You need to be well hydrated before your procedure. On October 7, you may drink approved liquids (see below) until 2 hours before the procedure, with 16 oz of water as your last intake.   List of approved liquids water, clear juice, clear tea, black coffee, fruit juices, non-citric and without pulp, carbonated beverages, Gatorade, Kool -Aid, plain Jello-O and plain ice popsicles.  4. Labs: You will need to have blood drawn on Friday, August 26 at [facility] office.  5. Medication instructions in preparation for your procedure:   Contrast Allergy: No  Stop taking, Torsemide  (Demadex ) and Fluid pill and potassium  Monday, October 6,Hold lasix  day of procedure.  On the morning of your procedure, take your morning medicines NOT listed above.  You may use sips of water.  6. Plan to go home the same day, you will only stay overnight if medically necessary. 7. Bring a current list of your medications and current insurance cards. 8. You MUST have a responsible person to drive you home. 9. Someone MUST be with you the first 24 hours after you arrive home or your discharge will be delayed. 10. Please wear clothes that are easy to get on and off and wear slip-on shoes.  Thank you for allowing us  to care for you!   -- [facility]   Follow-Up: At [facility], you and your health needs are our priority.  As part of our continuing mission to provide you with exceptional heart care, our providers are all part of one team.  This team includes your primary Cardiologist (physician) and Advanced Practice Providers or APPs (Physician Assistants and Nurse Practitioners)  who all work together to provide you with the care you need, when you need it.  Your next appointment:   1 month(s)  Provider:   You may see Redell Cave, MD or one of the following Advanced Practice Providers on your designated Care Team:   Lonni Meager, NP Lesley Maffucci, PA-C Bernardino Bring, PA-C Cadence Franchester, PA-C Tylene Lunch, NP Barnie Hila, NP            Signed, Miriam FORBES Shams, NP  01/21/2024 12:33 PM    [facility]

## 2024-01-20 NOTE — H&P (View-Only) (Signed)
 Cardiology Office Note:    Date:  01/21/2024   ID:  [name], DOB 11/12/1965, MRN [id number]  PCP:  Stephanie Charlene CROME, MD   [facility] Providers Cardiologist:  Redell Cave, MD     Referring MD: Stephanie Charlene CROME, MD   Chief complaint: 39-month follow-up  History of Present Illness:    [name] is a 58 y.o. male with a hx of  asthma, former smoker x 30+ years, right upper extremity DVT recently off Eliquis , morbid obesity, untreated OSA who presents to the office for follow-up after previous visit due to shortness of breath and edema.   Admitted to the hospital 06/2023 for inguinal hernia repair.  Hospital course complicated by bleeding, requiring transfusion and emergent abdominal surgery.  Developed right upper extremity DVT due to PICC line placement, currently on Eliquis  for this.  Echo obtained at that time showed normal systolic and diastolic function.  Was followed up with Dr. Cave on 09/30/2023 for SOB and edema.  Was reported to have no anginal symptoms at time of visit, but if they were to develop we will consider cardiac catheterization.  Switched patient from Lasix  to torsemide , and recommended follow-up for his untreated OSA, instructed to follow-up with cardiology in 6 weeks, visits were scheduled for 7/17 and 7/18 but both were canceled.  Was evaluated by [facility] pulmonology on 10/27/2023, etiology of his shortness of breath was considered multifactorial considering his mild and nonreversible obstruction on PFTs and considering his improvement in symptoms following increased diuresis.  Pulmonology mentions concern for pulmonary hypertension possibly secondary to HFpEF as well as untreated OSA, recommended proceeding with right heart catheterization to further evaluate his filling pressures and assess for pre versus postcapillary pulmonary hypertension.  CPAP machine ordered.  Echo on 10/26/2023 ordered by pulmonology shows LVEF 55-60%, normal LV  function, no RWMA, normal diastolic parameters, mildly enlarged right ventricle with mildly increased RV wall thickness.  LA and RA mildly dilated.  Normal mitral valve.  Normal aortic valve.  Pulmonic valve regurgitation not well assessed.  Right atrial pressure 3 mmHg.  Cannot exclude small PFO.  Follow-up on 11/16/2023 with vascular showed complete resolution of upper extremity DVT.  Duplex of bilateral lower extremities were ordered prior to cessation of anticoagulation to rule out LE DVT considering significant lower extremity varicose veins and recent hospitalization within last 6 months.  LE DVT study scheduled for 01/25/2024.  01/04/2024 seen by GI specialist for rectal bleeding, recommended colonoscopy. Will need cardiac clearance prior to upcoming colonoscopy.  Presents to clinic today doing well. He denies chest pain, palpitations, orthopnea, n, v, dizziness, syncope, weight gain, or early satiety. Reports continued DOE, no SOB at rest. Working with pulmonology on his breathing. Reports continued adherence to his torsemide , states he's having to urinate frequently and notices significant changes to his weight on a daily basis while he's on it. Bilateral leg edema improved while on torsemide . 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    Past Medical History:  Diagnosis Date   Asthma    as a child   Bronchitis 03/2016   pt has recovered from bronchitis   Chickenpox    Depression    Frequent headaches    GERD (gastroesophageal reflux disease)    when I was younger, better now.   Hypertension    Migraines    history of, not now  OSA (obstructive sleep apnea)     Past Surgical History:  Procedure Laterality Date   APPENDECTOMY  1977   HERNIA REPAIR     HYDROCELE EXCISION Right 06/01/2016   Procedure: HYDROCELECTOMY ADULT;  Surgeon: Rosina Riis, MD;  Location: [facility] ORS;  Service:  Urology;  Laterality: Right;   knee Left 1994   KNEE ARTHROSCOPY Right 1992   LAPAROTOMY N/A 07/13/2023   Procedure: LAPAROTOMY, EXPLORATORY;  Surgeon: Tye Millet, DO;  Location: [facility] ORS;  Service: General;  Laterality: N/A;  Pink pad for case is needed possilble open   LAPAROTOMY N/A 07/17/2023   Procedure: LAPAROTOMY, EXPLORATORY, ABDOMINAL WASHOUT WITH CLOSURE;  Surgeon: Desiderio Schanz, MD;  Location: [facility] ORS;  Service: General;  Laterality: N/A;   UMBILICAL HERNIA REPAIR  2013   VASECTOMY N/A 06/01/2016   Procedure: VASECTOMY;  Surgeon: Rosina Riis, MD;  Location: [facility] ORS;  Service: Urology;  Laterality: N/A;    Current Medications: Current Meds  Medication Sig   acetaminophen  (TYLENOL ) 325 MG tablet Take 2 tablets (650 mg total) by mouth every 6 (six) hours as needed for mild pain (pain score 1-3), fever or headache.   Cholecalciferol (D 1000) 25 MCG (1000 UT) capsule Take 5,000 Units by mouth daily.   omeprazole (PRILOSEC) 40 MG capsule Take 40 mg by mouth daily.   VENTOLIN  HFA 108 (90 Base) MCG/ACT inhaler Inhale 1 puff into the lungs every 4 (four) hours as needed.   [DISCONTINUED] potassium chloride  (MICRO-K ) 10 MEQ CR capsule Take 1 capsule (10 mEq total) by mouth daily.   [DISCONTINUED] torsemide  (DEMADEX ) 20 MG tablet Take 1 tablet (20 mg total) by mouth 2 (two) times daily.     Allergies:   Red blood cells, Sulfa antibiotics, and Whole blood   Social History   Socioeconomic History   Marital status: Widowed    Spouse name: Not on file   Number of children: Not on file   Years of education: Not on file   Highest education level: Not on file  Occupational History   Not on file  Tobacco Use   Smoking status: Former    Current packs/day: 0.50    Types: Cigarettes   Smokeless tobacco: Never   Tobacco comments:    March 11 2016  Vaping Use   Vaping status: Never Used  Substance and Sexual Activity   Alcohol use: Not Currently    Alcohol/week: 4.0 - 6.0  standard drinks of alcohol    Types: 4 - 6 Shots of liquor per week   Drug use: No   Sexual activity: Not on file  Other Topics Concern   Not on file  Social History Narrative   Single.   2 children.   Works independently as Building services engineer.   Enjoys reading, watching tv.   Social Drivers of Corporate investment banker Strain: Low Risk  (10/18/2023)   Received from [facility]   Overall Financial Resource Strain (CARDIA)    Difficulty of Paying Living Expenses: Not hard at all  Food Insecurity: No Food Insecurity (10/18/2023)   [facility] from [facility]   Hunger Vital Sign    Within the past 12 months, you worried that your food would run out before you got the money to buy more.: Never true    Within the past 12 months, the food you bought just didn't last and you didn't have money to get more.: Never true  Transportation Needs: No Transportation Needs (10/18/2023)   Received  from [facility]    In the past 12 months, has lack of transportation kept you from medical appointments or from getting medications?: No    Lack of Transportation (Non-Medical): No  Physical Activity: Not on file  Stress: Not on file  Social Connections: Not on file     Family History: The patient's family history includes Alcohol abuse in his father; Arthritis in his maternal grandmother; Breast cancer in his maternal grandmother; Colon cancer in his maternal grandmother; Heart attack in his paternal grandmother; Hyperlipidemia in his father, maternal grandfather, paternal grandfather, and paternal grandmother; Hypertension in his father, maternal grandfather, and paternal grandfather; Ovarian cancer in his maternal grandmother and mother; Prostate cancer in his maternal uncle. There is no history of Bladder Cancer or Kidney disease.  ROS:   Please see the history of present illness.    All other systems reviewed and are  negative.  EKGs/Labs/Other Studies Reviewed:    The following studies were reviewed today:  EKG Interpretation Date/Time:  Friday January 21 2024 10:33:15 EDT Ventricular Rate:  80 PR Interval:  160 QRS Duration:  88 QT Interval:  400 QTC Calculation: 461 R Axis:   55  Text Interpretation: Normal sinus rhythm Normal ECG When compared with ECG of 30-Sep-2023 10:21, No significant change was found Confirmed by Somaya Grassi [phone]) on 01/21/2024 10:41:33 AM    Recent Labs: 07/26/2023: ALT 33 08/03/2023: Hemoglobin 12.2; Platelets 275 08/05/2023: Magnesium  2.0 09/08/2023: B Natriuretic Peptide 73.9 10/18/2023: BUN 9; Creatinine, Ser 0.91; Potassium 4.2; Sodium 144  Recent Lipid Panel    Component Value Date/Time   CHOL 130 04/08/2016 0814   TRIG 64 07/26/2023 0558   HDL 43.70 04/08/2016 0814   CHOLHDL 3 04/08/2016 0814   VLDL 15.8 04/08/2016 0814   LDLCALC 71 04/08/2016 0814     Physical Exam:    VS:  BP 126/80 (BP Location: Left Arm, Patient Position: Sitting, Cuff Size: Large)   Pulse 80   Wt (!) 360 lb (163.3 kg)   SpO2 95%   BMI 46.22 kg/m     Wt Readings from Last 3 Encounters:  01/21/24 (!) 360 lb (163.3 kg)  01/04/24 (!) 368 lb (166.9 kg)  11/16/23 (!) 351 lb 12.8 oz (159.6 kg)     GEN:  Well nourished, well developed in no acute distress HEENT: Normal NECK: No JVD; No carotid bruits LYMPHATICS: No lymphadenopathy CARDIAC: RRR, no murmurs, rubs, gallops RESPIRATORY:  Clear to auscultation without rales, wheezing or rhonchi  ABDOMEN: Soft, non-tender, non-distended MUSCULOSKELETAL:  No edema; No deformity  SKIN: Warm and dry NEUROLOGIC:  Alert and oriented x 3 PSYCHIATRIC:  Normal affect   ASSESSMENT:    1. DOE (dyspnea on exertion)   2. Enlarged RV (right ventricle)    PLAN:    In order of problems listed above:  DOE RV enlargement and thickening  Denies CP, SOB at rest, orthopnea, palpitations  Do not believe LHC is necessary at this time  d/t lack of ischemic symptoms.  EKG: NSR 80 bpm Echo 10/26/2023 LVEF 55-60%, normal LV function, no RWMA, normal diastolic parameters. Mildly enlarged RV with mildly increased RV wall thickening, LA/RA mild dilatation, cannot exclude small PFO. Pulmonology recommends further evaluate his filling pressures and assess for pre versus postcapillary pulmonary hypertension  I agree with proceeding with RHC to allow for a more tailored treatment for patient's DOE Discussed the risks and benefits of RHC with patient, who is okay with  moving forward w/ procedure. Uncertain of PFO presence on echo. No history of strokes. Could consider further work up in future  Continue torsemide  20 mg BID Order BMP to check kidney function  Preprocedural surgical clearance Surgical risk evaluation for upcoming colonoscopy pending result of RHC Will likely require surgical clearance from pulmonology considering dyspnea If right heart pressures are moderately-severely elevated, will refer to advanced HF team for further management GI office will need to fax a formal request to our office for official cardiac recommendations.  OSA  Has not started using CPAP as he is waiting on pulmonology to adjust his CPAP settings.   Follow up with general cardiology in 3 months  Informed Consent   Shared Decision Making/Informed Consent The risks, including but not limited to, [bleeding or vascular complications (1 in 500), pneumothorax (1 in 1600), arrhythmia (1 in 1000) and death (1 in 5000)], benefits (diagnostic support and/or management of heart failure, pulmonary hypertension) and alternatives of a right heart catheterization were discussed in detail with [name] and he is willing to proceed.       Medication Adjustments/Labs and Tests Ordered: Current medicines are reviewed at length with the patient today.  Concerns regarding medicines are outlined above.  Orders Placed This Encounter  Procedures   Basic metabolic  panel with GFR   CBC   EKG 12-Lead   Meds ordered this encounter  Medications   potassium chloride  (MICRO-K ) 10 MEQ CR capsule    Sig: Take 1 capsule (10 mEq total) by mouth daily.    Dispense:  90 capsule    Refill:  3   torsemide  (DEMADEX ) 20 MG tablet    Sig: Take 1 tablet (20 mg total) by mouth 2 (two) times daily.    Dispense:  180 tablet    Refill:  3    Patient Instructions  Medication Instructions:  Your physician recommends that you continue on your current medications as directed. Please refer to the Current Medication list given to you today.   *If you need a refill on your cardiac medications before your next appointment, please call your pharmacy*  Lab Work: Your provider would like for you to have following labs drawn today BMP, CBC.   If you have labs (blood work) drawn today and your tests are completely normal, you will receive your results only by: MyChart Message (if you have MyChart) OR A paper copy in the mail If you have any lab test that is abnormal or we need to change your treatment, we will call you to review the results.  Testing/Procedures:  [place] A DEPT OF [place]. [facility] [place] [facility] AT [place] [address] [place] [address] Dept: [phone] Loc: [phone]  [name]  01/21/2024  You are scheduled for a Cardiac Catheterization on Tuesday, October 7 with Dr. Lonni End.  1. Please arrive at the [facility]ascular Center Entrance of [facility], [address], [place] [address] at 7:30 AM (This is 1 hour(s) prior to your procedure time).  Proceed to the Check-In Desk directly inside the entrance.  Procedure Parking: Use the entrance off of the [address] side of the hospital. Turn right upon entering and follow the driveway to parking that is directly in front of the Heart & Vascular Center. There is no valet parking available at this entrance, however there  is an awning directly in front of the Heart & Vascular Center for drop off/ pick  up for patients.  Special note: Every effort is made to have your procedure done on time. Please understand that emergencies sometimes delay scheduled procedures.  2. Diet: Nothing to eat after midnight.   3. Hydration: You need to be well hydrated before your procedure. On October 7, you may drink approved liquids (see below) until 2 hours before the procedure, with 16 oz of water as your last intake.   List of approved liquids water, clear juice, clear tea, black coffee, fruit juices, non-citric and without pulp, carbonated beverages, Gatorade, Kool -Aid, plain Jello-O and plain ice popsicles.  4. Labs: You will need to have blood drawn on Friday, August 26 at [facility] office.  5. Medication instructions in preparation for your procedure:   Contrast Allergy: No  Stop taking, Torsemide  (Demadex ) and Fluid pill and potassium  Monday, October 6,Hold lasix  day of procedure.  On the morning of your procedure, take your morning medicines NOT listed above.  You may use sips of water.  6. Plan to go home the same day, you will only stay overnight if medically necessary. 7. Bring a current list of your medications and current insurance cards. 8. You MUST have a responsible person to drive you home. 9. Someone MUST be with you the first 24 hours after you arrive home or your discharge will be delayed. 10. Please wear clothes that are easy to get on and off and wear slip-on shoes.  Thank you for allowing us  to care for you!   -- [place] [facility]   Follow-Up: At [facility], you and your health needs are our priority.  As part of our continuing mission to provide you with exceptional heart care, our providers are all part of one team.  This team includes your primary Cardiologist (physician) and Advanced Practice Providers or APPs (Physician Assistants and Nurse Practitioners)  who all work together to provide you with the care you need, when you need it.  Your next appointment:   1 month(s)  Provider:   You may see Redell Cave, MD or one of the following Advanced Practice Providers on your designated Care Team:   Lonni Meager, NP Lesley Maffucci, PA-C Bernardino Bring, PA-C Cadence Franchester, PA-C Tylene Lunch, NP Barnie Hila, NP            Signed, Miriam FORBES Shams, NP  01/21/2024 12:33 PM    [facility]

## 2024-01-21 ENCOUNTER — Encounter: Payer: Self-pay | Admitting: Physician Assistant

## 2024-01-21 ENCOUNTER — Ambulatory Visit: Payer: MEDICAID | Attending: Physician Assistant | Admitting: Emergency Medicine

## 2024-01-21 VITALS — BP 126/80 | HR 80 | Wt 360.0 lb

## 2024-01-21 DIAGNOSIS — I517 Cardiomegaly: Secondary | ICD-10-CM | POA: Insufficient documentation

## 2024-01-21 DIAGNOSIS — R0609 Other forms of dyspnea: Secondary | ICD-10-CM | POA: Insufficient documentation

## 2024-01-21 MED ORDER — POTASSIUM CHLORIDE ER 10 MEQ PO CPCR: 10.0000 meq | ORAL_CAPSULE | Freq: Every day | ORAL | 3 refills | Status: AC

## 2024-01-21 MED ORDER — TORSEMIDE 20 MG PO TABS: 20.0000 mg | ORAL_TABLET | Freq: Two times a day (BID) | ORAL | 3 refills | Status: AC

## 2024-01-21 NOTE — Patient Instructions (Signed)
 Medication Instructions:  Your physician recommends that you continue on your current medications as directed. Please refer to the Current Medication list given to you today.   *If you need a refill on your cardiac medications before your next appointment, please call your pharmacy*  Lab Work: Your provider would like for you to have following labs drawn today BMP, CBC.   If you have labs (blood work) drawn today and your tests are completely normal, you will receive your results only by: MyChart Message (if you have MyChart) OR A paper copy in the mail If you have any lab test that is abnormal or we need to change your treatment, we will call you to review the results.  Testing/Procedures:  [place] A DEPT OF [place]. [facility] [place] [facility] AT [place] [address] [place] [address] Dept: [phone] Loc: [phone]  AZELL BILL  01/21/2024  You are scheduled for a Cardiac Catheterization on Tuesday, October 7 with Dr. Lonni End.  1. Please arrive at the [facility] Entrance of [facility], [address] [place], [place] [address] at 7:30 AM (This is 1 hour(s) prior to your procedure time).  Proceed to the Check-In Desk directly inside the entrance.  Procedure Parking: Use the entrance off of the [address] side of the hospital. Turn right upon entering and follow the driveway to parking that is directly in front of the Heart & Vascular Center. There is no valet parking available at this entrance, however there is an awning directly in front of the Heart & Vascular Center for drop off/ pick up for patients.  Special note: Every effort is made to have your procedure done on time. Please understand that emergencies sometimes delay scheduled procedures.  2. Diet: Nothing to eat after midnight.   3. Hydration: You need to be well hydrated before your procedure. On October 7, you may drink approved  liquids (see below) until 2 hours before the procedure, with 16 oz of water as your last intake.   List of approved liquids water, clear juice, clear tea, black coffee, fruit juices, non-citric and without pulp, carbonated beverages, Gatorade, Kool -Aid, plain Jello-O and plain ice popsicles.  4. Labs: You will need to have blood drawn on Friday, August 26 at [facility] office.  5. Medication instructions in preparation for your procedure:   Contrast Allergy: No  Stop taking, Torsemide  (Demadex ) and Fluid pill and potassium  Monday, October 6,Hold lasix  day of procedure.  On the morning of your procedure, take your morning medicines NOT listed above.  You may use sips of water.  6. Plan to go home the same day, you will only stay overnight if medically necessary. 7. Bring a current list of your medications and current insurance cards. 8. You MUST have a responsible person to drive you home. 9. Someone MUST be with you the first 24 hours after you arrive home or your discharge will be delayed. 10. Please wear clothes that are easy to get on and off and wear slip-on shoes.  Thank you for allowing us  to care for you!   --  Invasive Cardiovascular services   Follow-Up: At [facility], you and your health needs are our priority.  As part of our continuing mission to provide you with exceptional heart care, our providers are all part of one team.  This team includes your primary Cardiologist (physician) and Advanced Practice Providers or APPs (Physician Assistants  and Nurse Practitioners) who all work together to provide you with the care you need, when you need it.  Your next appointment:   1 month(s)  Provider:   You may see Redell Cave, MD or one of the following Advanced Practice Providers on your designated Care Team:   Lonni Meager, NP Lesley Maffucci, PA-C Bernardino Bring, PA-C Cadence [place], PA-C Tylene Lunch, NP Barnie Hila, NP

## 2024-01-22 LAB — CBC
Hematocrit: 45.2 % (ref 37.5–51.0)
Hemoglobin: 15.6 g/dL (ref 13.0–17.7)
MCH: 35 pg — ABNORMAL HIGH (ref 26.6–33.0)
MCHC: 34.5 g/dL (ref 31.5–35.7)
MCV: 101 fL — ABNORMAL HIGH (ref 79–97)
Platelets: 145 x10E3/uL — ABNORMAL LOW (ref 150–450)
RBC: 4.46 x10E6/uL (ref 4.14–5.80)
RDW: 13.3 % (ref 11.6–15.4)
WBC: 5.5 x10E3/uL (ref 3.4–10.8)

## 2024-01-22 LAB — BASIC METABOLIC PANEL WITH GFR
BUN/Creatinine Ratio: 8 — ABNORMAL LOW (ref 9–20)
BUN: 7 mg/dL (ref 6–24)
CO2: 22 mmol/L (ref 20–29)
Calcium: 8.9 mg/dL (ref 8.7–10.2)
Chloride: 106 mmol/L (ref 96–106)
Creatinine, Ser: 0.87 mg/dL (ref 0.76–1.27)
Glucose: 84 mg/dL (ref 70–99)
Potassium: 4.2 mmol/L (ref 3.5–5.2)
Sodium: 143 mmol/L (ref 134–144)
eGFR: 101 mL/min/1.73 (ref >59)

## 2024-01-24 ENCOUNTER — Ambulatory Visit: Payer: Self-pay | Admitting: Emergency Medicine

## 2024-01-24 ENCOUNTER — Emergency Department: Payer: MEDICAID

## 2024-01-24 ENCOUNTER — Other Ambulatory Visit: Payer: Self-pay

## 2024-01-24 ENCOUNTER — Emergency Department
Admission: EM | Admit: 2024-01-24 | Discharge: 2024-01-24 | Disposition: A | Discharge status: Home / Self Care | Payer: MEDICAID | Attending: Emergency Medicine | Admitting: Emergency Medicine

## 2024-01-24 DIAGNOSIS — Z5321 Procedure and treatment not carried out due to patient leaving prior to being seen by health care provider: Secondary | ICD-10-CM | POA: Diagnosis not present

## 2024-01-24 DIAGNOSIS — R079 Chest pain, unspecified: Secondary | ICD-10-CM | POA: Diagnosis present

## 2024-01-24 DIAGNOSIS — R110 Nausea: Secondary | ICD-10-CM | POA: Insufficient documentation

## 2024-01-24 LAB — CBC
HCT: 41.6 % (ref 39.0–52.0)
Hemoglobin: 14.6 g/dL (ref 13.0–17.0)
MCH: 34.5 pg — ABNORMAL HIGH (ref 26.0–34.0)
MCHC: 35.1 g/dL (ref 30.0–36.0)
MCV: 98.3 fL (ref 80.0–100.0)
Platelets: 158 K/uL (ref 150–400)
RBC: 4.23 MIL/uL (ref 4.22–5.81)
RDW: 14 % (ref 11.5–15.5)
WBC: 10.6 K/uL — ABNORMAL HIGH (ref 4.0–10.5)
nRBC: 0 % (ref 0.0–0.2)

## 2024-01-24 LAB — BASIC METABOLIC PANEL WITH GFR
Anion gap: 11 (ref 5–15)
BUN: 8 mg/dL (ref 6–20)
CO2: 21 mmol/L — ABNORMAL LOW (ref 22–32)
Calcium: 8.4 mg/dL — ABNORMAL LOW (ref 8.9–10.3)
Chloride: 109 mmol/L (ref 98–111)
Creatinine, Ser: 0.77 mg/dL (ref 0.61–1.24)
GFR, Estimated: >60 mL/min (ref >60)
Glucose, Bld: 95 mg/dL (ref 70–99)
Potassium: 3.8 mmol/L (ref 3.5–5.1)
Sodium: 141 mmol/L (ref 135–145)

## 2024-01-24 LAB — BRAIN NATRIURETIC PEPTIDE: B Natriuretic Peptide: 32.5 pg/mL (ref 0.0–100.0)

## 2024-01-24 LAB — TROPONIN I (HIGH SENSITIVITY): Troponin I (High Sensitivity): 6 ng/L (ref <18)

## 2024-01-24 NOTE — ED Triage Notes (Signed)
 Pt to ED via EMS from home, pt reports chest pain/heaviness that began tonight and pain radiates into right jaw. Pt reports he is supposed to have heart cath on 10/07 for exploration. Pt states possible CHF.

## 2024-01-24 NOTE — ED Notes (Signed)
 BIB [facility] from home for CP. Radiates to neck and jaw. Started at 945 PM 12 lead was NSR.  20G LAC  4mg  of Zofran  for nausea Pt took 5 ASA PTA EMS.  BGL 116 97% RA  89 HR  122/77 BP  Right arm has blood clots? Supposed to get exploratory cath? ETOH on board.

## 2024-01-25 ENCOUNTER — Encounter (INDEPENDENT_AMBULATORY_CARE_PROVIDER_SITE_OTHER): Payer: MEDICAID

## 2024-01-25 ENCOUNTER — Ambulatory Visit (INDEPENDENT_AMBULATORY_CARE_PROVIDER_SITE_OTHER): Payer: MEDICAID | Admitting: Vascular Surgery

## 2024-01-28 ENCOUNTER — Ambulatory Visit: Payer: MEDICAID | Admitting: Student in an Organized Health Care Education/Training Program

## 2024-01-28 ENCOUNTER — Ambulatory Visit (INDEPENDENT_AMBULATORY_CARE_PROVIDER_SITE_OTHER): Payer: MEDICAID

## 2024-01-28 DIAGNOSIS — I872 Venous insufficiency (chronic) (peripheral): Secondary | ICD-10-CM

## 2024-01-31 ENCOUNTER — Ambulatory Visit (INDEPENDENT_AMBULATORY_CARE_PROVIDER_SITE_OTHER): Payer: MEDICAID | Admitting: Vascular Surgery

## 2024-02-01 ENCOUNTER — Encounter: Payer: Self-pay | Admitting: Internal Medicine

## 2024-02-01 ENCOUNTER — Encounter
Admission: RE | Disposition: A | Discharge status: Home / Self Care | Payer: Self-pay | Source: Home / Self Care | Attending: Internal Medicine

## 2024-02-01 ENCOUNTER — Ambulatory Visit
Admission: RE | Admit: 2024-02-01 | Discharge: 2024-02-01 | Disposition: A | Discharge status: Home / Self Care | Payer: MEDICAID | Attending: Internal Medicine | Admitting: Internal Medicine

## 2024-02-01 ENCOUNTER — Other Ambulatory Visit: Payer: Self-pay

## 2024-02-01 DIAGNOSIS — Z6841 Body Mass Index (BMI) 40.0 and over, adult: Secondary | ICD-10-CM | POA: Insufficient documentation

## 2024-02-01 DIAGNOSIS — Z86718 Personal history of other venous thrombosis and embolism: Secondary | ICD-10-CM | POA: Diagnosis not present

## 2024-02-01 DIAGNOSIS — I272 Pulmonary hypertension, unspecified: Secondary | ICD-10-CM

## 2024-02-01 DIAGNOSIS — J45909 Unspecified asthma, uncomplicated: Secondary | ICD-10-CM | POA: Insufficient documentation

## 2024-02-01 DIAGNOSIS — G4733 Obstructive sleep apnea (adult) (pediatric): Secondary | ICD-10-CM | POA: Diagnosis not present

## 2024-02-01 DIAGNOSIS — Z87891 Personal history of nicotine dependence: Secondary | ICD-10-CM | POA: Diagnosis not present

## 2024-02-01 DIAGNOSIS — I110 Hypertensive heart disease with heart failure: Secondary | ICD-10-CM | POA: Diagnosis not present

## 2024-02-01 HISTORY — PX: RIGHT HEART CATH: CATH118263

## 2024-02-01 LAB — POCT I-STAT EG7
Acid-Base Excess: 0 mmol/L (ref 0.0–2.0)
Acid-base deficit: 1 mmol/L (ref 0.0–2.0)
Bicarbonate: 23.5 mmol/L (ref 20.0–28.0)
Bicarbonate: 24.7 mmol/L (ref 20.0–28.0)
Calcium, Ion: 1.27 mmol/L (ref 1.15–1.40)
Calcium, Ion: 1.29 mmol/L (ref 1.15–1.40)
HCT: 42 % (ref 39.0–52.0)
HCT: 42 % (ref 39.0–52.0)
Hemoglobin: 14.3 g/dL (ref 13.0–17.0)
Hemoglobin: 14.3 g/dL (ref 13.0–17.0)
O2 Saturation: 75 %
O2 Saturation: 76 %
Potassium: 4 mmol/L (ref 3.5–5.1)
Potassium: 4 mmol/L (ref 3.5–5.1)
Sodium: 140 mmol/L (ref 135–145)
Sodium: 141 mmol/L (ref 135–145)
TCO2: 25 mmol/L (ref 22–32)
TCO2: 26 mmol/L (ref 22–32)
pCO2, Ven: 38.8 mmHg — ABNORMAL LOW (ref 44–60)
pCO2, Ven: 39.8 mmHg — ABNORMAL LOW (ref 44–60)
pH, Ven: 7.39 (ref 7.25–7.43)
pH, Ven: 7.4 (ref 7.25–7.43)
pO2, Ven: 40 mmHg (ref 32–45)
pO2, Ven: 41 mmHg (ref 32–45)

## 2024-02-01 SURGERY — RIGHT HEART CATH
Anesthesia: Moderate Sedation | Laterality: Right

## 2024-02-01 MED ORDER — HYDRALAZINE HCL 20 MG/ML IJ SOLN: 10.0000 mg | INTRAMUSCULAR | Status: DC | PRN

## 2024-02-01 MED ORDER — LIDOCAINE HCL (PF) 1 % IJ SOLN
INTRAMUSCULAR | Status: DC | PRN
  Administered 2024-02-01: 2 mL

## 2024-02-01 MED ORDER — SODIUM CHLORIDE 0.9% FLUSH: 3.0000 mL | Freq: Two times a day (BID) | INTRAVENOUS | Status: DC

## 2024-02-01 MED ORDER — HEPARIN (PORCINE) IN NACL 1000-0.9 UT/500ML-% IV SOLN
INTRAVENOUS | Status: DC | PRN
  Administered 2024-02-01: 1000 mL

## 2024-02-01 MED ORDER — MIDAZOLAM HCL 2 MG/2ML IJ SOLN
INTRAMUSCULAR | Status: AC
  Filled 2024-02-01: qty 2

## 2024-02-01 MED ORDER — IOHEXOL 300 MG/ML  SOLN
INTRAMUSCULAR | Status: DC | PRN
  Administered 2024-02-01: 10 mL

## 2024-02-01 MED ORDER — SODIUM CHLORIDE 0.9% FLUSH: 3.0000 mL | INTRAVENOUS | Status: DC | PRN

## 2024-02-01 MED ORDER — LABETALOL HCL 5 MG/ML IV SOLN: 10.0000 mg | INTRAVENOUS | Status: DC | PRN

## 2024-02-01 MED ORDER — FENTANYL CITRATE (PF) 100 MCG/2ML IJ SOLN
INTRAMUSCULAR | Status: AC
  Filled 2024-02-01: qty 2

## 2024-02-01 MED ORDER — SODIUM CHLORIDE 0.9 % IV SOLN: 250.0000 mL | INTRAVENOUS | Status: DC | PRN

## 2024-02-01 MED ORDER — ACETAMINOPHEN 325 MG PO TABS: 650.0000 mg | ORAL_TABLET | ORAL | Status: DC | PRN

## 2024-02-01 MED ORDER — LIDOCAINE HCL 1 % IJ SOLN
INTRAMUSCULAR | Status: AC
  Filled 2024-02-01: qty 20

## 2024-02-01 MED ORDER — ONDANSETRON HCL 4 MG/2ML IJ SOLN: 4.0000 mg | Freq: Four times a day (QID) | INTRAMUSCULAR | Status: DC | PRN

## 2024-02-01 SURGICAL SUPPLY — 6 items
CATH BALLN WEDGE 5F 110CM (CATHETERS) IMPLANT
DRAPE BRACHIAL (DRAPES) IMPLANT
PACK CARDIAC CATH (CUSTOM PROCEDURE TRAY) ×1 IMPLANT
SET ATX-X65L (MISCELLANEOUS) IMPLANT
SHEATH GLIDE SLENDER 4/5FR (SHEATH) IMPLANT
STATION PROTECTION PRESSURIZED (MISCELLANEOUS) IMPLANT

## 2024-02-01 NOTE — Interval H&P Note (Signed)
 History and Physical Interval Note:  02/01/2024 8:58 AM  [name]  has presented today for surgery, with the diagnosis of pulmonary hypertension.  The various methods of treatment have been discussed with the patient and family. After consideration of risks, benefits and other options for treatment, the patient has consented to  Procedure(s): RIGHT HEART CATH (Right) as a surgical intervention.  The patient's history has been reviewed, patient examined, no change in status, stable for surgery.  I have reviewed the patient's chart and labs.  Questions were answered to the patient's satisfaction.     [name]

## 2024-02-01 NOTE — Brief Op Note (Signed)
 BRIEF CARDIAC CATHETERIZATION NOTE  02/01/2024  10:13 AM  PATIENT:  [name]  58 y.o. male  PRE-OPERATIVE DIAGNOSIS:  Pulmonary hypertension  POST-OPERATIVE DIAGNOSIS:  Same PROCEDURE:  Procedure(s): RIGHT HEART CATH (Right)  SURGEON:  Surgeons and Role:    * Gill Delrossi, MD - Primary  FINDINGS: Mild elevated left heart, right heart, and pulmonary artery pressures. Normal Fick cardiac output/index.  RECOMMENDATIONS: Continue diuresis.  Lonni Hanson, MD [facility]

## 2024-02-08 ENCOUNTER — Ambulatory Visit: Payer: Self-pay | Admitting: Emergency Medicine

## 2024-02-08 ENCOUNTER — Ambulatory Visit (INDEPENDENT_AMBULATORY_CARE_PROVIDER_SITE_OTHER): Payer: MEDICAID | Admitting: Vascular Surgery

## 2024-02-18 ENCOUNTER — Encounter (INDEPENDENT_AMBULATORY_CARE_PROVIDER_SITE_OTHER): Payer: Self-pay | Admitting: Vascular Surgery

## 2024-02-18 ENCOUNTER — Telehealth: Payer: Self-pay | Admitting: Emergency Medicine

## 2024-02-18 ENCOUNTER — Ambulatory Visit (INDEPENDENT_AMBULATORY_CARE_PROVIDER_SITE_OTHER): Payer: MEDICAID | Admitting: Vascular Surgery

## 2024-02-18 VITALS — BP 143/81 | HR 82 | Resp 18 | Ht 71.0 in | Wt 364.8 lb

## 2024-02-18 DIAGNOSIS — Z6841 Body Mass Index (BMI) 40.0 and over, adult: Secondary | ICD-10-CM

## 2024-02-18 DIAGNOSIS — I10 Essential (primary) hypertension: Secondary | ICD-10-CM

## 2024-02-18 DIAGNOSIS — M7989 Other specified soft tissue disorders: Secondary | ICD-10-CM | POA: Diagnosis not present

## 2024-02-18 DIAGNOSIS — I872 Venous insufficiency (chronic) (peripheral): Secondary | ICD-10-CM

## 2024-02-18 NOTE — Telephone Encounter (Signed)
 Called left message to call back - can speak to any triage nurse

## 2024-02-18 NOTE — Assessment & Plan Note (Signed)
 Weight loss would be of benefits for reducing leg swelling.

## 2024-02-18 NOTE — Telephone Encounter (Signed)
Pt returning call regarding CT results. Please advise

## 2024-02-18 NOTE — Assessment & Plan Note (Signed)
 A recently performed venous duplex showed bilateral great saphenous vein reflux.  No DVT or superficial thrombophlebitis was identified.  Recommend  I have reviewed my previous  discussion with the patient regarding  varicose veins and why they cause symptoms. Patient will continue  wearing graduated compression stockings class 1 on a daily basis, beginning first thing in the morning and removing them in the evening.  The patient is CEAP [id number].  The patient has been wearing compression for more than 12 weeks with no or little benefit.  The patient has been exercising daily for more than 12 weeks. The patient has been elevating and taking OTC pain medications for more than 12 weeks.  None of these have have eliminated the pain related to the varicose veins and venous reflux or the discomfort regarding venous congestion.    In addition, behavioral modification including elevation during the day was again discussed and this will continue.  The patient has utilized over the counter pain medications and has been exercising.  However, at this time conservative therapy has not alleviated the patient's symptoms of leg pain and swelling  Recommend: laser ablation of the right and  left great saphenous veins to eliminate the symptoms of pain and swelling of the lower extremities caused by the severe superficial venous reflux disease.

## 2024-02-18 NOTE — Telephone Encounter (Signed)
 Spoke with pt who reports he is aware of   Miriam FORBES Shams, NP 02/08/2024  6:03 AM EDT     Please call the patient to let him know his RHC did not show evidence of significant pulmonary hypertension. Have him keep his follow up appointment with Dr. Darliss on 10/27, and let him know he should follow up with pulmonology regarding on-going management of his SOB.   The above information and already has follow up scheduled with Dr Darliss.

## 2024-02-18 NOTE — Progress Notes (Signed)
 MRN : [id number]  [name] is a 58 y.o. ([birth year]) male who presents with chief complaint of No chief complaint on file. [name]  History of Present Illness: Patient returns today in follow up of his venous insufficiency.  He had a previous arm DVT that was treated with anticoagulation and resolved.  He really has no worrisome symptoms in the arm at this point.  He has a painful, prominent varicosities in both lower extremities a little worse on the right than the left.  No open wounds or infection.  He also has significant swelling in both lower extremities as well as some discoloration and stasis dermatitis changes.  20 to 30 mm compression socks have not been significantly beneficial in removing the pain and swelling in the legs.  He has been trying to elevate his legs and exercise.  A recently performed venous duplex showed bilateral great saphenous vein reflux.  No DVT or superficial thrombophlebitis was identified.  Current Outpatient Medications  Medication Sig Dispense Refill   cyanocobalamin (VITAMIN B12) 1000 MCG tablet Take 1,000 mcg by mouth daily.     omeprazole (PRILOSEC) 40 MG capsule Take 40 mg by mouth daily.     potassium chloride  (MICRO-K ) 10 MEQ CR capsule Take 1 capsule (10 mEq total) by mouth daily. 90 capsule 3   torsemide  (DEMADEX ) 20 MG tablet Take 1 tablet (20 mg total) by mouth 2 (two) times daily. 180 tablet 3   VENTOLIN  HFA 108 (90 Base) MCG/ACT inhaler Inhale 1 puff into the lungs every 4 (four) hours as needed for wheezing or shortness of breath.     No current facility-administered medications for this visit.    Past Medical History:  Diagnosis Date   Asthma    as a child   Bronchitis 03/2016   pt has recovered from bronchitis   Chickenpox    Depression    Frequent headaches    GERD (gastroesophageal reflux disease)    when I was younger, better now.   Hypertension    Migraines    history of, not now   OSA (obstructive sleep apnea)     Past  Surgical History:  Procedure Laterality Date   APPENDECTOMY  1977   HERNIA REPAIR     HYDROCELE EXCISION Right 06/01/2016   Procedure: HYDROCELECTOMY ADULT;  Surgeon: Rosina Riis, MD;  Location: [facility];  Service: Urology;  Laterality: Right;   knee Left 1994   KNEE ARTHROSCOPY Right 1992   LAPAROTOMY N/A 07/13/2023   Procedure: LAPAROTOMY, EXPLORATORY;  Surgeon: Tye Millet, DO;  Location: [facility];  Service: General;  Laterality: N/A;  Pink pad for case is needed possilble open   LAPAROTOMY N/A 07/17/2023   Procedure: LAPAROTOMY, EXPLORATORY, ABDOMINAL WASHOUT WITH CLOSURE;  Surgeon: Desiderio Schanz, MD;  Location: [facility];  Service: General;  Laterality: N/A;   RIGHT HEART CATH Right 02/01/2024   Procedure: RIGHT HEART CATH;  Surgeon: Mady Bruckner, MD;  Location: [facility];  Service: Cardiovascular;  Laterality: Right;   UMBILICAL HERNIA REPAIR  2013   VASECTOMY N/A 06/01/2016   Procedure: VASECTOMY;  Surgeon: Rosina Riis, MD;  Location: [facility];  Service: Urology;  Laterality: N/A;     Social History   Tobacco Use   Smoking status: Former    Current packs/day: 0.50    Types: Cigarettes   Smokeless tobacco: Never   Tobacco comments:    March 11 2016  Vaping Use   Vaping status: Never Used  Substance  Use Topics   Alcohol use: Not Currently    Alcohol/week: 4.0 - 6.0 standard drinks of alcohol    Types: 4 - 6 Shots of liquor per week   Drug use: No      Family History  Problem Relation Age of Onset   Ovarian cancer Mother    Alcohol abuse Father    Hyperlipidemia Father    Hypertension Father    Prostate cancer Maternal Uncle    Arthritis Maternal Grandmother    Colon cancer Maternal Grandmother    Ovarian cancer Maternal Grandmother    Breast cancer Maternal Grandmother    Hyperlipidemia Maternal Grandfather    Hypertension Maternal Grandfather    Hyperlipidemia Paternal Grandmother    Heart attack Paternal Grandmother    Hyperlipidemia  Paternal Grandfather    Hypertension Paternal Grandfather    Bladder Cancer Neg Hx    Kidney disease Neg Hx      Allergies  Allergen Reactions   Red Blood Cells Other (See Comments)    Recently had a blood transfusion that resulted in systemic sepsis and organ failure.   Sulfa Antibiotics Anaphylaxis and Hives   Whole Blood Other (See Comments)    Recently had a blood transfusion that resulted in systemic sepsis and organ failure.    REVIEW OF SYSTEMS (Negative unless checked)   Constitutional: [] Weight loss  [] Fever  [] Chills Cardiac: [] Chest pain   [] Chest pressure   [] Palpitations   [] Shortness of breath when laying flat   [] Shortness of breath at rest   [x] Shortness of breath with exertion. Vascular:  [] Pain in legs with walking   [] Pain in legs at rest   [] Pain in legs when laying flat   [] Claudication   [] Pain in feet when walking  [] Pain in feet at rest  [] Pain in feet when laying flat   [x] History of DVT   [] Phlebitis   [x] Swelling in legs   [] Varicose veins   [] Non-healing ulcers Pulmonary:   [] Uses home oxygen   [] Productive cough   [] Hemoptysis   [] Wheeze  [] COPD   [] Asthma Neurologic:  [] Dizziness  [] Blackouts   [] Seizures   [] History of stroke   [] History of TIA  [] Aphasia   [] Temporary blindness   [] Dysphagia   [] Weakness or numbness in arms   [] Weakness or numbness in legs Musculoskeletal:  [x] Arthritis   [] Joint swelling   [] Joint pain   [] Low back pain Hematologic:  [] Easy bruising  [] Easy bleeding   [] Hypercoagulable state   [] Anemic  [] Hepatitis Gastrointestinal:  [] Blood in stool   [] Vomiting blood  [x] Gastroesophageal reflux/heartburn   [] Abdominal pain Genitourinary:  [] Chronic kidney disease   [] Difficult urination  [] Frequent urination  [] Burning with urination   [] Hematuria Skin:  [] Rashes   [] Ulcers   [] Wounds Psychological:  [] History of anxiety   []  History of major depression.  Physical Examination  BP (!) 143/81   Pulse 82   Resp 18   Ht 5' 11 (1.803  m)   Wt (!) 364 lb 12.8 oz (165.5 kg)   BMI 50.88 kg/m  Gen:  WD/WN, NAD. Obese  Head: [place]/AT, No temporalis wasting. Ear/Nose/Throat: Hearing grossly intact, nares w/o erythema or drainage Eyes: Conjunctiva clear. Sclera non-icteric Neck: Supple.  Trachea midline Pulmonary:  Good air movement, no use of accessory muscles.  Cardiac: RRR, no JVD Vascular:  Vessel Right Left  Radial Palpable Palpable               Musculoskeletal: M/S 5/5 throughout.  No deformity or  atrophy.  Prominent superficial varicosities present bilaterally worse on the right than the left.  These measure 2 to 3 mm on the right and 1 to 2 mm on the left.  Mild to moderate stasis dermatitis changes with some hyperpigmentation.  1+ bilateral lower extremity edema. Neurologic: Sensation grossly intact in extremities.  Symmetrical.  Speech is fluent.  Psychiatric: Judgment intact, Mood & affect appropriate for pt's clinical situation. Dermatologic: No rashes or ulcers noted.  No cellulitis or open wounds.      Labs Recent Results (from the past 2160 hours)  Basic metabolic panel with GFR     Status: Abnormal   Collection Time: 01/21/24 11:52 AM  Result Value Ref Range   Glucose 84 70 - 99 mg/dL   BUN 7 6 - 24 mg/dL   Creatinine, Ser 9.12 0.76 - 1.27 mg/dL   eGFR 898 >40 fO/fpw/8.26   BUN/Creatinine Ratio 8 (L) 9 - 20   Sodium 143 134 - 144 mmol/L   Potassium 4.2 3.5 - 5.2 mmol/L   Chloride 106 96 - 106 mmol/L   CO2 22 20 - 29 mmol/L   Calcium 8.9 8.7 - 10.2 mg/dL  CBC     Status: Abnormal   Collection Time: 01/21/24 11:52 AM  Result Value Ref Range   WBC 5.5 3.4 - 10.8 x10E3/uL   RBC 4.46 4.14 - 5.80 x10E6/uL   Hemoglobin 15.6 13.0 - 17.7 g/dL   Hematocrit 54.7 62.4 - 51.0 %   MCV 101 (H) 79 - 97 fL   MCH 35.0 (H) 26.6 - 33.0 pg   MCHC 34.5 31.5 - 35.7 g/dL   RDW 86.6 88.3 - 84.5 %   Platelets 145 (L) 150 - 450 x10E3/uL  Basic metabolic panel     Status: Abnormal   Collection Time: 01/24/24  12:24 AM  Result Value Ref Range   Sodium 141 135 - 145 mmol/L   Potassium 3.8 3.5 - 5.1 mmol/L   Chloride 109 98 - 111 mmol/L   CO2 21 (L) 22 - 32 mmol/L   Glucose, Bld 95 70 - 99 mg/dL    Comment: Glucose reference range applies only to samples taken after fasting for at least 8 hours.   BUN 8 6 - 20 mg/dL   Creatinine, Ser 9.22 0.61 - 1.24 mg/dL   Calcium 8.4 (L) 8.9 - 10.3 mg/dL   GFR, Estimated >39 >39 mL/min    Comment: (NOTE) Calculated using the CKD-EPI Creatinine Equation (2021)    Anion gap 11 5 - 15    Comment: Performed at [facility], [address] [place], [place] [address]  CBC     Status: Abnormal   Collection Time: 01/24/24 12:24 AM  Result Value Ref Range   WBC 10.6 (H) 4.0 - 10.5 K/uL   RBC 4.23 4.22 - 5.81 MIL/uL   Hemoglobin 14.6 13.0 - 17.0 g/dL   HCT 58.3 60.9 - 47.9 %   MCV 98.3 80.0 - 100.0 fL   MCH 34.5 (H) 26.0 - 34.0 pg   MCHC 35.1 30.0 - 36.0 g/dL   RDW 85.9 88.4 - 84.4 %   Platelets 158 150 - 400 K/uL   nRBC 0.0 0.0 - 0.2 %    Comment: Performed at [facility], [address] [place], [place] [address]  Troponin I (High Sensitivity)     Status: None   Collection Time: 01/24/24 12:24 AM  Result Value Ref Range   Troponin I (High Sensitivity) 6 <18 ng/L  Comment: (NOTE) Elevated high sensitivity troponin I (hsTnI) values and significant  changes across serial measurements may suggest ACS but many other  chronic and acute conditions are known to elevate hsTnI results.  Refer to the Links section for chest pain algorithms and additional  guidance. Performed at [facility], [address] [place], [place] [address]   Brain natriuretic peptide     Status: None   Collection Time: 01/24/24 12:24 AM  Result Value Ref Range   B Natriuretic Peptide 32.5 0.0 - 100.0 pg/mL    Comment: Performed at [facility], [address] [place], [place] [address]  POCT I-Stat EG7     Status: Abnormal    Collection Time: 02/01/24  9:23 AM  Result Value Ref Range   pH, Ven 7.390 7.25 - 7.43   pCO2, Ven 38.8 (L) 44 - 60 mmHg   pO2, Ven 41 32 - 45 mmHg   Bicarbonate 23.5 20.0 - 28.0 mmol/L   TCO2 25 22 - 32 mmol/L   O2 Saturation 76 %   Acid-base deficit 1.0 0.0 - 2.0 mmol/L   Sodium 140 135 - 145 mmol/L   Potassium 4.0 3.5 - 5.1 mmol/L   Calcium, Ion 1.29 1.15 - 1.40 mmol/L   HCT 42.0 39.0 - 52.0 %   Hemoglobin 14.3 13.0 - 17.0 g/dL   Sample type VENOUS   POCT I-Stat EG7     Status: Abnormal   Collection Time: 02/01/24  9:27 AM  Result Value Ref Range   pH, Ven 7.400 7.25 - 7.43   pCO2, Ven 39.8 (L) 44 - 60 mmHg   pO2, Ven 40 32 - 45 mmHg   Bicarbonate 24.7 20.0 - 28.0 mmol/L   TCO2 26 22 - 32 mmol/L   O2 Saturation 75 %   Acid-Base Excess 0.0 0.0 - 2.0 mmol/L   Sodium 141 135 - 145 mmol/L   Potassium 4.0 3.5 - 5.1 mmol/L   Calcium, Ion 1.27 1.15 - 1.40 mmol/L   HCT 42.0 39.0 - 52.0 %   Hemoglobin 14.3 13.0 - 17.0 g/dL   Sample type VENOUS     Radiology CARDIAC CATHETERIZATION Result Date: 02/01/2024 Conclusions: Mildly elevated left heart filling pressure (PCWP 20 mmHg, with V waves up to 32 mmHg). Mildly-moderately elevated right heart and pulmonary artery pressures (mean RA 11, RV 48/12, PA 49/20, and mean PA 30 mmHg). Normal Fick cardiac output/index (CO 8.7 L/min, CI 3.1 L/min/m). Recommendations: Continue diuresis. Lonni Hanson, MD [facility]  VAS US  LOWER EXTREMITY VENOUS REFLUX Result Date: 01/31/2024  Lower Venous Reflux Study Patient Name:  DEO MEHRINGER  Date of Exam:   01/28/2024 Medical Rec #: [id number]        Accession #:    [id number] Date of Birth: [birth year]         Patient Gender: M Patient Age:   86 years Exam Location:  [facility] Procedure:      VAS US  LOWER EXTREMITY VENOUS REFLUX Referring Phys: SELINDA GU --------------------------------------------------------------------------------  Indications: Swelling, Pain, and varicosities.   Performing Technologist: Jerel Croak RVT  Examination Guidelines: A complete evaluation includes B-mode imaging, spectral Doppler, color Doppler, and power Doppler as needed of all accessible portions of each vessel. Bilateral testing is considered an integral part of a complete examination. Limited examinations for reoccurring indications may be performed as noted. The reflux portion of the exam is performed with the patient in reverse Trendelenburg. Significant venous reflux is defined as >500 ms  in the superficial venous system, and >1 second in the deep venous system.  Venous Reflux Times +--------------+---------+------+-----------+------------+--------+ RIGHT         Reflux NoRefluxReflux TimeDiameter cmsComments                         Yes                                  +--------------+---------+------+-----------+------------+--------+ GSV at SFJ              yes    >500 ms      .58              +--------------+---------+------+-----------+------------+--------+ GSV prox thigh                 >500 ms      .66              +--------------+---------+------+-----------+------------+--------+ GSV mid thigh                  >500 ms      .68              +--------------+---------+------+-----------+------------+--------+ GSV dist thigh                 >500 ms      .69              +--------------+---------+------+-----------+------------+--------+ GSV at knee                    >500 ms      .70              +--------------+---------+------+-----------+------------+--------+ GSV prox calf no                             .5              +--------------+---------+------+-----------+------------+--------+ SSV prox calf no                            .24              +--------------+---------+------+-----------+------------+--------+  +--------------+---------+------+-----------+------------+--------+ LEFT          Reflux NoRefluxReflux TimeDiameter  cmsComments                         Yes                                  +--------------+---------+------+-----------+------------+--------+ GSV at SFJ              yes    >500 ms      .70              +--------------+---------+------+-----------+------------+--------+ GSV prox thigh          yes    >500 ms      .67              +--------------+---------+------+-----------+------------+--------+ GSV mid thigh           yes    >500 ms      .72              +--------------+---------+------+-----------+------------+--------+ GSV dist thigh  yes    >500 ms      .72              +--------------+---------+------+-----------+------------+--------+ GSV at knee             yes    >500 ms      .63              +--------------+---------+------+-----------+------------+--------+ GSV prox calf           yes    >500 ms      .52              +--------------+---------+------+-----------+------------+--------+ SSV prox calf no                            .26              +--------------+---------+------+-----------+------------+--------+   Summary: Bilateral: - No evidence of deep vein thrombosis seen in the lower extremities, bilaterally, from the common femoral through the popliteal veins. - No evidence of superficial venous thrombosis in the lower extremities, bilaterally. - No evidence of deep venous insufficiency seen bilaterally in the lower extremity.  Right: - Venous reflux is noted in the right sapheno-femoral junction. - Venous reflux is noted in the right greater saphenous vein in the thigh.  Left: - Venous reflux is noted in the left sapheno-femoral junction. - Venous reflux is noted in the left greater saphenous vein in the thigh. - Venous reflux is noted in the left greater saphenous vein in the calf.  *See table(s) above for measurements and observations. Electronically signed by Selinda Gu MD on 01/31/2024 at 12:09:13 PM.    Final    DG Chest 2  View Result Date: 01/24/2024 CLINICAL DATA:  Chest pain EXAM: CHEST - 2 VIEW COMPARISON:  12/13/2023 FINDINGS: The heart size and mediastinal contours are within normal limits. Both lungs are clear. The visualized skeletal structures are unremarkable. IMPRESSION: No active cardiopulmonary disease. Electronically Signed   By: Franky Crease M.D.   On: 01/24/2024 01:38    Assessment/Plan  Venous (peripheral) insufficiency A recently performed venous duplex showed bilateral great saphenous vein reflux.  No DVT or superficial thrombophlebitis was identified.  Recommend  I have reviewed my previous  discussion with the patient regarding  varicose veins and why they cause symptoms. Patient will continue  wearing graduated compression stockings class 1 on a daily basis, beginning first thing in the morning and removing them in the evening.  The patient is CEAP [id number].  The patient has been wearing compression for more than 12 weeks with no or little benefit.  The patient has been exercising daily for more than 12 weeks. The patient has been elevating and taking OTC pain medications for more than 12 weeks.  None of these have have eliminated the pain related to the varicose veins and venous reflux or the discomfort regarding venous congestion.    In addition, behavioral modification including elevation during the day was again discussed and this will continue.  The patient has utilized over the counter pain medications and has been exercising.  However, at this time conservative therapy has not alleviated the patient's symptoms of leg pain and swelling  Recommend: laser ablation of the right and  left great saphenous veins to eliminate the symptoms of pain and swelling of the lower extremities caused by the severe superficial venous reflux disease.   Morbid obesity with BMI of 50.0-59.9, adult (HCC) Weight loss would  be of benefits for reducing leg swelling.   Hypertension blood pressure control  important in reducing the progression of atherosclerotic disease. On appropriate oral medications.     Swelling of arm From his catheter related DVT.  Now basically resolved.  Selinda Gu, MD  02/18/2024 10:57 AM    This note was created with Dragon medical transcription system.  Any errors from dictation are purely unintentional

## 2024-02-21 ENCOUNTER — Ambulatory Visit: Payer: MEDICAID | Attending: Cardiology | Admitting: Cardiology

## 2024-02-21 ENCOUNTER — Encounter: Payer: Self-pay | Admitting: Cardiology

## 2024-02-21 VITALS — BP 130/72 | HR 111 | Ht 72.0 in | Wt 362.6 lb

## 2024-02-21 DIAGNOSIS — R0602 Shortness of breath: Secondary | ICD-10-CM | POA: Diagnosis present

## 2024-02-21 DIAGNOSIS — R600 Localized edema: Secondary | ICD-10-CM

## 2024-02-21 DIAGNOSIS — Z01810 Encounter for preprocedural cardiovascular examination: Secondary | ICD-10-CM | POA: Diagnosis not present

## 2024-02-21 NOTE — Patient Instructions (Signed)

## 2024-02-21 NOTE — Progress Notes (Signed)
 Cardiology Office Note:    Date:  02/21/2024   ID:  [name], DOB [birth year], MRN [id number]  PCP:  Stephanie Charlene CROME, MD   [facility] Providers Cardiologist:  Redell Cave, MD     Referring MD: Stephanie Charlene CROME, MD   No chief complaint on file.  History of Present Illness:    [name] is a 58 y.o. male with a hx of asthma, former smoker x 30+ years, right upper extremity DVT, morbid obesity, untreated OSA who presents for follow-up.  Last seen due to shortness of breath and edema.  Started on torsemide  40 mg daily with good effect.  Initially some torsemide  dose due to running errands.  Typically has adequate diuresis with about 10 pound weight loss after taking torsemide .    Right heart cath 10/25 showed mild to moderately elevated PA pressures, mean PA 30 mmHg.  Colonoscopy being planned in the near future.  Prior test/testing Echo obtained 06/2023 showed normal systolic and diastolic function. Patient was admitted to the hospital 06/2023 for an inguinal hernia repair.  Hospital course complicated by bleeding, requiring transfusions and emergent abdominal surgery.  Developed a right upper extremity DVT due to PICC line placement.   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   Past Medical History:  Diagnosis Date   Asthma    as a child   Bronchitis 03/2016   pt has recovered from bronchitis   Chickenpox    Depression    Frequent headaches    GERD (gastroesophageal reflux disease)    when I was younger, better now.   Hypertension    Migraines    history of, not now   OSA (obstructive sleep apnea)     Past Surgical History:  Procedure Laterality Date   APPENDECTOMY  1977   HERNIA REPAIR     HYDROCELE EXCISION Right 06/01/2016   Procedure: HYDROCELECTOMY ADULT;  Surgeon: Rosina Riis, MD;  Location: [facility];  Service: Urology;  Laterality: Right;   knee Left 1994   KNEE ARTHROSCOPY Right 1992   LAPAROTOMY N/A 07/13/2023    Procedure: LAPAROTOMY, EXPLORATORY;  Surgeon: Tye Millet, DO;  Location: [facility];  Service: General;  Laterality: N/A;  Pink pad for case is needed possilble open   LAPAROTOMY N/A 07/17/2023   Procedure: LAPAROTOMY, EXPLORATORY, ABDOMINAL WASHOUT WITH CLOSURE;  Surgeon: Desiderio Schanz, MD;  Location: [facility];  Service: General;  Laterality: N/A;   RIGHT HEART CATH Right 02/01/2024   Procedure: RIGHT HEART CATH;  Surgeon: Mady Bruckner, MD;  Location: [facility];  Service: Cardiovascular;  Laterality: Right;   UMBILICAL HERNIA REPAIR  2013   VASECTOMY N/A 06/01/2016   Procedure: VASECTOMY;  Surgeon: Rosina Riis, MD;  Location: [facility];  Service: Urology;  Laterality: N/A;    Current Medications: Current Meds  Medication Sig   cyanocobalamin (VITAMIN B12) 1000 MCG tablet Take 1,000 mcg by mouth daily.   omeprazole (PRILOSEC) 40 MG capsule Take 40 mg by mouth daily.   potassium chloride  (MICRO-K ) 10 MEQ CR capsule Take 1 capsule (10 mEq total) by mouth daily.   torsemide  (DEMADEX ) 20 MG tablet Take 1 tablet (20 mg total) by mouth 2 (two) times daily.   VENTOLIN  HFA 108 (90 Base) MCG/ACT inhaler Inhale 1 puff into the lungs every 4 (four) hours as needed for wheezing or shortness of breath.     Allergies:   Red blood cells, Sulfa antibiotics, and Whole blood   Social History  Socioeconomic History   Marital status: Widowed    Spouse name: Not on file   Number of children: Not on file   Years of education: Not on file   Highest education level: Not on file  Occupational History   Not on file  Tobacco Use   Smoking status: Former    Current packs/day: 0.50    Types: Cigarettes   Smokeless tobacco: Never   Tobacco comments:    March 11 2016  Vaping Use   Vaping status: Never Used  Substance and Sexual Activity   Alcohol use: Not Currently    Alcohol/week: 4.0 - 6.0 standard drinks of alcohol    Types: 4 - 6 Shots of liquor per week   Drug use: No   Sexual  activity: Not on file  Other Topics Concern   Not on file  Social History Narrative   Single.   2 children.   Works independently as BUILDING SERVICES ENGINEER.   Enjoys reading, watching tv.   Social Drivers of Corporate Investment Banker Strain: Low Risk  (10/18/2023)   Received from [facility]   Overall Financial Resource Strain (CARDIA)    Difficulty of Paying Living Expenses: Not hard at all  Food Insecurity: No Food Insecurity (10/18/2023)   Received from [facility]   Hunger Vital Sign    Within the past 12 months, you worried that your food would run out before you got the money to buy more.: Never true    Within the past 12 months, the food you bought just didn't last and you didn't have money to get more.: Never true  Transportation Needs: No Transportation Needs (10/18/2023)   Received from [facility] - Transportation    In the past 12 months, has lack of transportation kept you from medical appointments or from getting medications?: No    Lack of Transportation (Non-Medical): No  Physical Activity: Not on file  Stress: Not on file  Social Connections: Not on file     Family History: The patient's family history includes Alcohol abuse in his father; Arthritis in his maternal grandmother; Breast cancer in his maternal grandmother; Colon cancer in his maternal grandmother; Heart attack in his paternal grandmother; Hyperlipidemia in his father, maternal grandfather, paternal grandfather, and paternal grandmother; Hypertension in his father, maternal grandfather, and paternal grandfather; Ovarian cancer in his maternal grandmother and mother; Prostate cancer in his maternal uncle. There is no history of Bladder Cancer or Kidney disease.  ROS:   Please see the history of present illness.     All other systems reviewed and are negative.  EKGs/Labs/Other Studies Reviewed:    The following studies were reviewed today:       Recent  Labs: 07/26/2023: ALT 33 08/05/2023: Magnesium  2.0 01/24/2024: B Natriuretic Peptide 32.5; BUN 8; Creatinine, Ser 0.77; Platelets 158 02/01/2024: Hemoglobin 14.3; Potassium 4.0; Sodium 141  Recent Lipid Panel    Component Value Date/Time   CHOL 130 04/08/2016 0814   TRIG 64 07/26/2023 0558   HDL 43.70 04/08/2016 0814   CHOLHDL 3 04/08/2016 0814   VLDL 15.8 04/08/2016 0814   LDLCALC 71 04/08/2016 0814     Risk Assessment/Calculations:             Physical Exam:    VS:  BP 130/72 (BP Location: Left Arm, Patient Position: Sitting)   Pulse (!) 111   Ht 6' (1.829 m)   Wt (!) 362 lb 9.6 oz (  164.5 kg)   SpO2 98%   BMI 49.18 kg/m     Wt Readings from Last 3 Encounters:  02/21/24 (!) 362 lb 9.6 oz (164.5 kg)  02/18/24 (!) 364 lb 12.8 oz (165.5 kg)  02/01/24 (!) 362 lb (164.2 kg)     GEN:  Well nourished, well developed in no acute distress HEENT: Normal NECK: No JVD; No carotid bruits CARDIAC: RRR, no murmurs, rubs, gallops RESPIRATORY:  Clear to auscultation without rales, wheezing or rhonchi  ABDOMEN: Soft, non-tender, non-distended MUSCULOSKELETAL:  1+ edema; No deformity  SKIN: Warm and dry NEUROLOGIC:  Alert and oriented x 3 PSYCHIATRIC:  Normal affect   ASSESSMENT:    1. Shortness of breath   2. Bilateral leg edema   3. Morbid obesity (HCC)   4. Pre-procedural cardiovascular examination    PLAN:    In order of problems listed above:  Shortness of breath, normal EF 65%, diastolic function normal.  Etiology multifactorial including morbid obesity, untreated sleep apnea, pulmonary scarring.  Continue torsemide  for edema management.   Bilateral leg edema, likely from morbid obesity.  Continue torsemide  40 mg daily, KCl 10 mEq daily.  Check BMP in 10 days. Morbid obesity, low-calorie diet advised. Preprocedural exam, echo with normal EF, okay for colonoscopy from a cardiac perspective.  Follow-up in 6 months.     Medication Adjustments/Labs and Tests  Ordered: Current medicines are reviewed at length with the patient today.  Concerns regarding medicines are outlined above.  No orders of the defined types were placed in this encounter.  No orders of the defined types were placed in this encounter.   Patient Instructions  Medication Instructions:  Your physician recommends that you continue on your current medications as directed. Please refer to the Current Medication list given to you today.   *If you need a refill on your cardiac medications before your next appointment, please call your pharmacy*  Lab Work: No labs ordered today  If you have labs (blood work) drawn today and your tests are completely normal, you will receive your results only by: MyChart Message (if you have MyChart) OR A paper copy in the mail If you have any lab test that is abnormal or we need to change your treatment, we will call you to review the results.  Testing/Procedures: No test ordered today   Follow-Up: At [facility], you and your health needs are our priority.  As part of our continuing mission to provide you with exceptional heart care, our providers are all part of one team.  This team includes your primary Cardiologist (physician) and Advanced Practice Providers or APPs (Physician Assistants and Nurse Practitioners) who all work together to provide you with the care you need, when you need it.  Your next appointment:   6 month(s)  Provider:   You may see Redell Cave, MD or one of the following Advanced Practice Providers on your designated Care Team:   Lonni Meager, NP Lesley Maffucci, PA-C Bernardino Bring, PA-C Cadence [place], PA-C Tylene Lunch, NP Barnie Hila, NP    We recommend signing up for the patient portal called MyChart.  Sign up information is provided on this After Visit Summary.  MyChart is used to connect with patients for Virtual Visits (Telemedicine).  Patients are able to view lab/test results, encounter  notes, upcoming appointments, etc.  Non-urgent messages can be sent to your provider as well.   To learn more about what you can do with MyChart, go to [email or web address].  Signed, Redell Cave, MD  02/21/2024 1:02 PM    [facility]

## 2024-02-22 ENCOUNTER — Encounter: Payer: Self-pay | Admitting: Student in an Organized Health Care Education/Training Program

## 2024-02-22 ENCOUNTER — Ambulatory Visit (INDEPENDENT_AMBULATORY_CARE_PROVIDER_SITE_OTHER): Payer: MEDICAID | Admitting: Student in an Organized Health Care Education/Training Program

## 2024-02-22 ENCOUNTER — Other Ambulatory Visit (HOSPITAL_COMMUNITY): Payer: Self-pay

## 2024-02-22 VITALS — BP 136/84 | HR 83 | Temp 97.5°F | Ht 72.0 in | Wt 365.2 lb

## 2024-02-22 DIAGNOSIS — I272 Pulmonary hypertension, unspecified: Secondary | ICD-10-CM

## 2024-02-22 DIAGNOSIS — R0602 Shortness of breath: Secondary | ICD-10-CM

## 2024-02-22 DIAGNOSIS — G4733 Obstructive sleep apnea (adult) (pediatric): Secondary | ICD-10-CM

## 2024-02-22 DIAGNOSIS — I5032 Chronic diastolic (congestive) heart failure: Secondary | ICD-10-CM | POA: Diagnosis not present

## 2024-02-22 MED ORDER — UMECLIDINIUM-VILANTEROL 62.5-25 MCG/ACT IN AEPB: 1.0000 | INHALATION_SPRAY | Freq: Every day | RESPIRATORY_TRACT | 12 refills | Status: AC

## 2024-02-22 NOTE — Patient Instructions (Signed)
  VISIT SUMMARY: During your visit, we discussed your ongoing issues with shortness of breath, fluid retention, and sleep apnea. We reviewed your recent right heart catheterization results and addressed your weight fluctuations and phlegm production. We also talked about your current CPAP machine discomfort and the need for a new sleep study.  YOUR PLAN: -HEART FAILURE WITH POSTCAPILLARY PULMONARY HYPERTENSION: Heart failure with postcapillary pulmonary hypertension means that your heart is not pumping blood as well as it should, causing increased pressure in the lungs. We will refer you to the heart failure clinic for specialized management. Continue taking torsemide  as advised by your cardiologist, strictly limit your salt intake, monitor your fluid intake, and work on losing weight.  -CHRONIC SHORTNESS OF BREATH: Chronic shortness of breath means you have ongoing difficulty breathing, which has worsened recently. We will prescribe a long-acting inhaler (Anoro Ellipta) to use once a day and advise you to try using your Ventolin  inhaler to see if it helps relieve your symptoms.  -OBSTRUCTIVE SLEEP APNEA: Obstructive sleep apnea is a condition where your breathing stops and starts during sleep. We will order a new sleep study to reassess your condition and update your CPAP settings. You will be referred to a sleep medicine physician for further management.  -OBESITY: Obesity means having excess body weight, which can worsen your heart failure and pulmonary hypertension. Continue with your dietary changes to reduce salt and processed food intake, and focus on losing weight to help manage your conditions.  INSTRUCTIONS: Please follow up with the heart failure clinic for specialized management of your heart condition. Schedule a new sleep study to reassess your obstructive sleep apnea and consult with a sleep medicine physician. Continue monitoring your weight and fluid intake, and use the prescribed  inhalers as directed. If you have any questions or concerns, please contact our office.

## 2024-02-22 NOTE — Progress Notes (Signed)
 Assessment & Plan:   #Shortness of breath #Group II Pulmonary hypertension (HCC) #Chronic HFpEF  His right heart cath numbers are consistent with post capillary pulmonary hypertension, with elevated wedge pressure of 20 mmHg and mean PA pressure of 30 mmHg. PVR was normal at 1.1, and his TPG of 10 and DPG of 0 are all consistent with post capillary pulmonary hypertension with no suggestion of a pre-capillary component.  Symptoms include fluid retention and exertional dyspnea, with fluid retention not fully controlled by current management. Pulmonary hypertension is attributed to heart failure rather than obstructive sleep apnea or other causes. Will refer to the heart failure clinic for specialized management. Continue torsemide  with dose adjustments as advised by cardiology. Advise strict salt restriction and fluid intake monitoring. Encourage weight loss.  Chronic shortness of breath has worsened slightly over the past three months with some phlegm production, particularly in the morning and at night. Previous pneumonia may have contributed to respiratory symptoms. Ventolin  inhaler is prescribed but not used regularly. No evidence of COPD based on pulmonary function tests. That said, I will prescribe a long-acting inhaler to empirically treat any obstructive component to his symptoms, and we will re-assess for improvement in symptoms at follow up. I will also consider repeating his chest CT on follow up.   - AMB referral to CHF clinic - umeclidinium-vilanterol (ANORO ELLIPTA) 62.5-25 MCG/ACT AEPB; Inhale 1 puff into the lungs daily.  Dispense: 30 each; Refill: 12  #OSA (obstructive sleep apnea) (Primary)  Obstructive sleep apnea is present with outdated CPAP equipment. He reports discomfort with the current CPAP mask and has not been using it. A new sleep study is needed to reassess the condition and update CPAP settings. Order a new sleep study to reassess obstructive sleep apnea. Refer to  a sleep medicine physician for management of sleep apnea and potential CPAP equipment update.  - Split night study; Future  #Obesity    Obesity is contributing to shortness of breath and complicating heart failure management. He has made dietary changes to reduce salt and processed food intake. Weight fluctuations are noted, possibly related to fluid retention. Encourage continued dietary modifications to support weight loss. Advise on the importance of weight loss in managing heart failure and pulmonary hypertension. He might qualify for tirzepatide depending on severity of his sleep apnea.  -sleep apnea workup -weight loss encouraged  Return in about 3 months (around 05/24/2024).  Devin November, MD [facility]  I spent 30 minutes caring for this patient today, including preparing to see the patient, obtaining a medical history , reviewing a separately obtained history, performing a medically appropriate examination and/or evaluation, counseling and educating the patient/family/caregiver, ordering medications, tests, or procedures, documenting clinical information in the electronic health record, and independently interpreting results (not separately reported/billed) and communicating results to the patient/family/caregiver  End of visit medications:  Meds ordered this encounter  Medications   umeclidinium-vilanterol (ANORO ELLIPTA) 62.5-25 MCG/ACT AEPB    Sig: Inhale 1 puff into the lungs daily.    Dispense:  30 each    Refill:  12     Current Outpatient Medications:    cyanocobalamin (VITAMIN B12) 1000 MCG tablet, Take 1,000 mcg by mouth daily., Disp: , Rfl:    omeprazole (PRILOSEC) 40 MG capsule, Take 40 mg by mouth daily., Disp: , Rfl:    potassium chloride  (MICRO-K ) 10 MEQ CR capsule, Take 1 capsule (10 mEq total) by mouth daily., Disp: 90 capsule, Rfl: 3   torsemide  (  DEMADEX ) 20 MG tablet, Take 1 tablet (20 mg total) by mouth 2 (two) times daily., Disp: 180  tablet, Rfl: 3   umeclidinium-vilanterol (ANORO ELLIPTA) 62.5-25 MCG/ACT AEPB, Inhale 1 puff into the lungs daily., Disp: 30 each, Rfl: 12   VENTOLIN  HFA 108 (90 Base) MCG/ACT inhaler, Inhale 1 puff into the lungs every 4 (four) hours as needed for wheezing or shortness of breath., Disp: , Rfl:    Subjective:   PATIENT ID: [name] GENDER: male DOB: October 26, 1965, MRN: [id number]  Chief Complaint  Patient presents with   Shortness of Breath    DOE. No wheezing. Cough with white/yellow sputum.  Albuterol - PRN   Sleep Apnea    Sleep Study on     HPI  Discussed the use of AI scribe software for clinical note transcription with the patient, who gave verbal consent to proceed.  [name] is a 58 year old male with heart failure, group two pulmonary hypertension and obstructive sleep apnea who presents for follow up.  Initial Visit 09/08/2023:  Patient was admitted to [facility] on 07/13/2023 for an inguinal hernia repair that was complicated by bleed and postoperative hypoxic respiratory failure requiring ICU admission.  Course was complicated by likely blood transfusion related reaction as well as likely hospital-acquired pneumonia. These were treated with broad-spectrum antibiotics as well as diuretics.  He did have to be briefly intubated for respiratory failure in the setting of bowel leak triggered by dehiscence from significant cough. He was discharged on 08/05/2023. He was also found to have an upper extremity DVT in the setting of PICC line placement for which he was started on Apixaban  and followed by Dr. Rennie from hem/onc.   Following hospital discharge, he felt somewhat better.  He was seen by his primary care physician and has received a course of antibiotics as well as 2 courses of prednisone  (40 mg for 5 days).  The prednisone  helped with symptoms.  He has not had any fevers or chills, nor has he had any chest pain or chest tightness.  The cough has been present since  discharge but was not present prior to this hospitalization.  He has had some weight gain as well as some increase in his lower extremity edema.   Return Visit 10/27/2023:   He feels much better compared to prior, and feels he is back to near baseline. Shortness of breath is improved, and he has no cough, no chest pain, and no chest tightness. He continues to attempt to loose weight. He is on torsemide . He was seen by cardiology.   Return Visit 02/22/2024:  He experiences persistent shortness of breath that worsens with exertion, such as walking across a parking lot, requiring 20 to 30 seconds to catch his breath. A right heart catheterization on February 01, 2024, was consistent with heart failure. RHC numbers showed normal cardiac output and cardiac index, elevated wedge pressure of 20, elevated mean pulmonary artery pressure of 30, diastolic pulmonary gradient of 0, and a transpulmonary gradient of 10.  He experiences weight fluctuations, losing and gaining up to 10 pounds in a day, attributed to torsemide  use. He takes torsemide  but avoids it during outings due to frequent urination. He reports a weight increase of 5 pounds overnight without significant dietary changes. He has significantly reduced his salt intake and avoids canned and processed foods.  He has a history of obstructive sleep apnea and is requesting a new CPAP machine. He has not used his current CPAP machine  due to discomfort with the chin strap and noise. His last sleep study was conducted 11 years ago.  He reports phlegm production primarily in the mornings and at night, which has worsened slightly over the past three months. He experienced a prolonged cold in September, which required rescheduling appointments.  He has been diagnosed with vein reflux by vascular specialists, who recommended laser surgery.  He uses Ventolin  as needed but has not felt the need to use it frequently, as his shortness of breath resolves after  resting. He has a history of using albuterol  and nebulizer treatments more frequently in the past.    Patient's medical history is notable for a diagnosis of asthma in his childhood that he thinks he grew out of.  He does tell me that he had to be intubated in his early 30s after walking through a perfume store where he was exposed to scents.  He felt very short of breath and EMS were activated and he was intubated in the field.  He was also intubated secondary to electrocution.   Patient has a history of smoking, with around 40 to 50 pack years of smoking history.  He quit smoking in March 2025.  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  He was deployed in [employer] and does not recall burn pit exposure.  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 They have two dogs and a cat. He had a pet bird between 65 and 1994. No illicit drug use reported.  Ancillary information including prior medications, full medical/surgical/family/social histories, and PFTs (when available) are listed below and have been reviewed.    Review of Systems  Constitutional:  Negative for chills, diaphoresis, fever, malaise/fatigue and weight loss.  Respiratory:  Positive for shortness of breath. Negative for cough, hemoptysis, sputum production and wheezing.   Cardiovascular:  Negative for chest pain.     Objective:   Vitals:   02/22/24 0859  BP: 136/84  Pulse: 83  Temp: (!) 97.5 F (36.4 C)  SpO2: 100%  Weight: (!) 365 lb 3.2 oz (165.7 kg)  Height: 6' (1.829 m)   100% on RA  BMI Readings from Last 3 Encounters:  02/22/24 49.53 kg/m  02/21/24 49.18 kg/m  02/18/24 50.88 kg/m   Wt Readings from Last 3 Encounters:   02/22/24 (!) 365 lb 3.2 oz (165.7 kg)  02/21/24 (!) 362 lb 9.6 oz (164.5 kg)  02/18/24 (!) 364 lb 12.8 oz (165.5 kg)    Physical Exam Constitutional:      Appearance: He is well-developed. He is obese. He is not ill-appearing.  Cardiovascular:     Rate and Rhythm: Normal rate and regular rhythm.     Pulses: Normal pulses.     Heart sounds: Normal heart sounds.  Pulmonary:     Effort: Pulmonary effort is normal. No respiratory distress.     Breath sounds: Normal breath sounds. No wheezing, rhonchi or rales.  Musculoskeletal:     Right lower leg: Edema present.     Left lower leg: Edema present.  Neurological:     General: No focal deficit present.     Mental Status: He is alert and oriented to person, place, and time. Mental  status is at baseline.       Ancillary Information    Past Medical History:  Diagnosis Date   Asthma    as a child   Bronchitis 03/2016   pt has recovered from bronchitis   Chickenpox    Depression    Frequent headaches    GERD (gastroesophageal reflux disease)    when I was younger, better now.   Hypertension    Migraines    history of, not now   OSA (obstructive sleep apnea)      Family History  Problem Relation Age of Onset   Ovarian cancer Mother    Alcohol abuse Father    Hyperlipidemia Father    Hypertension Father    Prostate cancer Maternal Uncle    Arthritis Maternal Grandmother    Colon cancer Maternal Grandmother    Ovarian cancer Maternal Grandmother    Breast cancer Maternal Grandmother    Hyperlipidemia Maternal Grandfather    Hypertension Maternal Grandfather    Hyperlipidemia Paternal Grandmother    Heart attack Paternal Grandmother    Hyperlipidemia Paternal Grandfather    Hypertension Paternal Grandfather    Bladder Cancer Neg Hx    Kidney disease Neg Hx      Past Surgical History:  Procedure Laterality Date   APPENDECTOMY  1977   HERNIA REPAIR     HYDROCELE EXCISION Right 06/01/2016   Procedure:  HYDROCELECTOMY ADULT;  Surgeon: Rosina Riis, MD;  Location: [facility] OR[facility];  Service: Urology;  Laterality: Right;   knee Left 1994   KNEE ARTHROSCOPY Right 1992   LAPAROTOMY N/A 07/13/2023   Procedure: LAPAROTOMY, EXPLORATORY;  Surgeon: Tye Millet, DO;  Location: [facility];  Service: General;  Laterality: N/A;  Pink pad for case is needed possilble open   LAPAROTOMY N/A 07/17/2023   Procedure: LAPAROTOMY, EXPLORATORY, ABDOMINAL WASHOUT WITH CLOSURE;  Surgeon: Desiderio Schanz, MD;  Location: [facility];  Service: General;  Laterality: N/A;   RIGHT HEART CATH Right 02/01/2024   Procedure: RIGHT HEART CATH;  Surgeon: Mady Bruckner, MD;  Location: [facility];  Service: Cardiovascular;  Laterality: Right;   UMBILICAL HERNIA REPAIR  2013   VASECTOMY N/A 06/01/2016   Procedure: VASECTOMY;  Surgeon: Rosina Riis, MD;  Location: [facility];  Service: Urology;  Laterality: N/A;    Social History   Socioeconomic History   Marital status: Widowed    Spouse name: Not on file   Number of children: Not on file   Years of education: Not on file   Highest education level: Not on file  Occupational History   Not on file  Tobacco Use   Smoking status: Former    Current packs/day: 0.50    Types: Cigarettes   Smokeless tobacco: Never   Tobacco comments:    Leblanc 15 2017  Vaping Use   Vaping status: Never Used  Substance and Sexual Activity   Alcohol use: Not Currently    Alcohol/week: 4.0 - 6.0 standard drinks of alcohol    Types: 4 - 6 Shots of liquor per week   Drug use: No   Sexual activity: Not on file  Other Topics Concern   Not on file  Social History Narrative   Single.   2 children.   Works independently as BUILDING SERVICES ENGINEER.   Enjoys reading, watching tv.   Social Drivers of Corporate Investment Banker Strain: Low Risk  (10/18/2023)   Received from [facility]   Overall Financial Resource Strain (CARDIA)  Difficulty of Paying Living Expenses: Not hard at all  Food  Insecurity: No Food Insecurity (10/18/2023)   Received from [facility]   Hunger Vital Sign    Within the past 12 months, you worried that your food would run out before you got the money to buy more.: Never true    Within the past 12 months, the food you bought just didn't last and you didn't have money to get more.: Never true  Transportation Needs: No Transportation Needs (10/18/2023)   Received from [facility] - Transportation    In the past 12 months, has lack of transportation kept you from medical appointments or from getting medications?: No    Lack of Transportation (Non-Medical): No  Physical Activity: Not on file  Stress: Not on file  Social Connections: Not on file  Intimate Partner Violence: Not At Risk (07/14/2023)   Humiliation, Afraid, Rape, and Kick questionnaire    Fear of Current or Ex-Partner: No    Emotionally Abused: No    Physically Abused: No    Sexually Abused: No     Allergies  Allergen Reactions   Red Blood Cells Other (See Comments)    Recently had a blood transfusion that resulted in systemic sepsis and organ failure.   Sulfa Antibiotics Anaphylaxis and Hives   Whole Blood Other (See Comments)    Recently had a blood transfusion that resulted in systemic sepsis and organ failure.     CBC    Component Value Date/Time   WBC 10.6 (H) 01/24/2024 0024   RBC 4.23 01/24/2024 0024   HGB 14.3 02/01/2024 0927   HGB 15.6 01/21/2024 1152   HCT 42.0 02/01/2024 0927   HCT 45.2 01/21/2024 1152   PLT 158 01/24/2024 0024   PLT 145 (L) 01/21/2024 1152   MCV 98.3 01/24/2024 0024   MCV 101 (H) 01/21/2024 1152   MCV 97 12/20/2013 1121   MCH 34.5 (H) 01/24/2024 0024   MCHC 35.1 01/24/2024 0024   RDW 14.0 01/24/2024 0024   RDW 13.3 01/21/2024 1152   RDW 13.3 12/20/2013 1121   LYMPHSABS 2.2 07/29/2023 0621   LYMPHSABS 1.7 05/03/2012 0407   MONOABS 0.8 07/29/2023 0621   MONOABS 0.9 05/03/2012 0407   EOSABS 0.4  07/29/2023 0621   EOSABS 0.1 05/03/2012 0407   BASOSABS 0.1 07/29/2023 0621   BASOSABS 0.0 05/03/2012 0407    Pulmonary Functions Testing Results:    Latest Ref Rng & Units 10/27/2023    9:45 AM  PFT Results  FVC-Pre L 4.07   FVC-Predicted Pre % 72   FVC-Post L 4.10   FVC-Predicted Post % 72   Pre FEV1/FVC % % 67   Post FEV1/FCV % % 70   FEV1-Pre L 2.74   FEV1-Predicted Pre % 63   FEV1-Post L 2.88   DLCO uncorrected ml/min/mmHg 15.25   DLCO UNC% % 47   DLVA Predicted % 99   TLC L 6.58   TLC % Predicted % 84   RV % Predicted % 120     Outpatient Medications Prior to Visit  Medication Sig Dispense Refill   cyanocobalamin (VITAMIN B12) 1000 MCG tablet Take 1,000 mcg by mouth daily.     omeprazole (PRILOSEC) 40 MG capsule Take 40 mg by mouth daily.     potassium chloride  (MICRO-K ) 10 MEQ CR capsule Take 1 capsule (10 mEq total) by mouth daily. 90 capsule 3   torsemide  (DEMADEX ) 20 MG tablet Take 1  tablet (20 mg total) by mouth 2 (two) times daily. 180 tablet 3   VENTOLIN  HFA 108 (90 Base) MCG/ACT inhaler Inhale 1 puff into the lungs every 4 (four) hours as needed for wheezing or shortness of breath.     No facility-administered medications prior to visit.

## 2024-03-13 ENCOUNTER — Ambulatory Visit: Payer: MEDICAID | Admitting: Neurology

## 2024-03-20 ENCOUNTER — Encounter: Payer: Self-pay | Admitting: *Deleted

## 2024-03-21 ENCOUNTER — Ambulatory Visit: Payer: MEDICAID | Admitting: Neurology

## 2024-03-21 ENCOUNTER — Encounter: Payer: Self-pay | Admitting: Neurology

## 2024-03-21 VITALS — BP 127/78 | HR 86 | Ht 72.0 in | Wt 362.0 lb

## 2024-03-21 DIAGNOSIS — G43709 Chronic migraine without aura, not intractable, without status migrainosus: Secondary | ICD-10-CM | POA: Diagnosis not present

## 2024-03-21 DIAGNOSIS — G5712 Meralgia paresthetica, left lower limb: Secondary | ICD-10-CM

## 2024-03-21 MED ORDER — NURTEC 75 MG PO TBDP: ORAL_TABLET | ORAL | 6 refills | Status: DC

## 2024-03-21 NOTE — Progress Notes (Unsigned)
 Chief Complaint  Patient presents with   New Patient (Initial Visit)    Pt in room 14. Bernice daughter in room,Paper referral for Occipital headache.    ASSESSMENT AND PLAN  [name] is a 58 y.o. male   Left lateral femoral cutaneous neuropathy  As needed lidocaine  cream  Chronic migraine  Up to 15 migraine days in a month  Nurtec as preventive medication also as needed for abortive treatment  Not a good candidate for triptan due to multiple vascular risk factor, High risk for obstructive sleep apnea  Sleep study pending  DIAGNOSTIC DATA (LABS, IMAGING, TESTING) - I reviewed patient records, labs, notes, testing and imaging myself where available.   MEDICAL HISTORY:  [name], seen in request by [place],  [place] [address], Hamrick, Charlene CROME, MD   History is obtained from the patient and review of electronic medical records. I personally reviewed pertinent available imaging films in PACS.   PMHx of  Obesity Asthma CHF Hernia repair Hx of alcohol abuse OSA  Since then  numbness of left lateral thing, sensitive, swells more, gave out couple times, knee turn side way,  No low back pain No feet numbness,   Right picc line somenmbness,   Hedaches somes and goes, crown region, in hospital, 2-3 times a week, comes one, x lasting 2 days, tylenol  rpn.  Migraine, light noise, not as naues, not as everel   Ingunial hernia, he did have hx of abdominal hernia, [facility] , blood clot picc ine of right upper extermit, severe penumnia, x 6months    In hospital 3 week March 2205,     PHYSICAL EXAM:   Vitals:   03/21/24 1247  BP: 127/78  Pulse: 86  Weight: (!) 362 lb (164.2 kg)  Height: 6' (1.829 m)     Body mass index is 49.1 kg/m.  PHYSICAL EXAMNIATION:  Gen: NAD, conversant, well nourised, well groomed                     Cardiovascular: Regular rate rhythm, no peripheral edema, warm, nontender. Eyes: Conjunctivae clear without exudates or  hemorrhage Neck: Supple, no carotid bruits. Pulmonary: Clear to auscultation bilaterally   NEUROLOGICAL EXAM:  MENTAL STATUS: Speech/cognition: Awake, alert, oriented to history taking and casual conversation CRANIAL NERVES: CN II: Visual fields are full to confrontation. Pupils are round equal and briskly reactive to light. CN III, IV, VI: extraocular movement are normal. No ptosis. CN V: Facial sensation is intact to light touch CN VII: Face is symmetric with normal eye closure  CN VIII: Hearing is normal to causal conversation. CN IX, X: Phonation is normal. CN XI: Head turning and shoulder shrug are intact CN XII: Small narrow oropharyngeal space  MOTOR: There is no pronator drift of out-stretched arms. Muscle bulk and tone are normal. Muscle strength is normal.  REFLEXES: Reflexes are 2+ and symmetric at the biceps, triceps, knees, and ankles. Plantar responses are flexor.  SENSORY: Intact to light touch, pinprick and vibratory sensation are intact in fingers and toes.  COORDINATION: There is no trunk or limb dysmetria noted.  GAIT/STANCE: Push-up gait is steady  REVIEW OF SYSTEMS:  Full 14 system review of systems performed and notable only for as above All other review of systems were negative.   ALLERGIES: Allergies  Allergen Reactions   Red Blood Cells Other (See Comments)    Recently had a blood transfusion that resulted in systemic sepsis and organ failure.  Sulfa Antibiotics Anaphylaxis and Hives   Whole Blood Other (See Comments)    Recently had a blood transfusion that resulted in systemic sepsis and organ failure.    HOME MEDICATIONS: Current Outpatient Medications  Medication Sig Dispense Refill   cyanocobalamin (VITAMIN B12) 1000 MCG tablet Take 1,000 mcg by mouth daily.     omeprazole (PRILOSEC) 40 MG capsule Take 40 mg by mouth daily.     potassium chloride  (MICRO-K ) 10 MEQ CR capsule Take 1 capsule (10 mEq total) by mouth daily. 90 capsule 3    torsemide  (DEMADEX ) 20 MG tablet Take 1 tablet (20 mg total) by mouth 2 (two) times daily. 180 tablet 3   umeclidinium-vilanterol (ANORO ELLIPTA ) 62.5-25 MCG/ACT AEPB Inhale 1 puff into the lungs daily. 30 each 12   VENTOLIN  HFA 108 (90 Base) MCG/ACT inhaler Inhale 1 puff into the lungs every 4 (four) hours as needed for wheezing or shortness of breath.     No current facility-administered medications for this visit.    PAST MEDICAL HISTORY: Past Medical History:  Diagnosis Date   Acute deep vein thrombosis (DVT) of right lower extremity after procedure (HCC)    Anaphylactoid reaction due to blood product    Arthritis    Asthma    as a child   Atherosclerosis of aorta    Bronchitis 03/2016   pt has recovered from bronchitis   Cellulitis    CHF (congestive heart failure) [facility])    Oct 2025   Chickenpox    Depression    Frequent headaches    GERD (gastroesophageal reflux disease)    when I was younger, better now.   Hepatic steatosis    Hernia, inguinal    History of alcohol use    History of rectal bleeding    History of thrombocytosis    Hypertension    Hyperthyroidism    Hypokalemia    Hyponatremia    Leg edema    Migraines    history of, not now   Morbid obesity (HCC)    OSA (obstructive sleep apnea)    Perioperative dehiscence of abdominal wound with evisceration, subsequent encounter    Pneumonia    Postoperative ileus (HCC)    SOB (shortness of breath)    Transfusion related acute lung injury (TRALI)    Tremor    Vitamin D deficiency     PAST SURGICAL HISTORY: Past Surgical History:  Procedure Laterality Date   APPENDECTOMY  1977   HERNIA REPAIR     HYDROCELE EXCISION Right 06/01/2016   Procedure: HYDROCELECTOMY ADULT;  Surgeon: Rosina Riis, MD;  Location: [facility] ORS;  Service: Urology;  Laterality: Right;   knee Left 1994   KNEE ARTHROSCOPY Right 1992   LAPAROTOMY N/A 07/13/2023   Procedure: LAPAROTOMY, EXPLORATORY;  Surgeon: Tye Millet, DO;   Location: [facility] ORS;  Service: General;  Laterality: N/A;  Pink pad for case is needed possilble open   LAPAROTOMY N/A 07/17/2023   Procedure: LAPAROTOMY, EXPLORATORY, ABDOMINAL WASHOUT WITH CLOSURE;  Surgeon: Desiderio Schanz, MD;  Location: [facility] ORS;  Service: General;  Laterality: N/A;   RIGHT HEART CATH Right 02/01/2024   Procedure: RIGHT HEART CATH;  Surgeon: Mady Bruckner, MD;  Location: [facility] INVASIVE CV LAB;  Service: Cardiovascular;  Laterality: Right;   UMBILICAL HERNIA REPAIR  2013   VASECTOMY N/A 06/01/2016   Procedure: VASECTOMY;  Surgeon: Rosina Riis, MD;  Location: [facility] ORS;  Service: Urology;  Laterality: N/A;    FAMILY HISTORY: Family History  Problem Relation  Age of Onset   Ovarian cancer Mother    Alcohol abuse Father    Hyperlipidemia Father    Hypertension Father    Prostate cancer Maternal Uncle    Arthritis Maternal Grandmother    Colon cancer Maternal Grandmother    Ovarian cancer Maternal Grandmother    Breast cancer Maternal Grandmother    Hyperlipidemia Maternal Grandfather    Hypertension Maternal Grandfather    Hyperlipidemia Paternal Grandmother    Heart attack Paternal Grandmother    Hyperlipidemia Paternal Grandfather    Hypertension Paternal Grandfather    Bladder Cancer Neg Hx    Kidney disease Neg Hx     SOCIAL HISTORY: Social History   Socioeconomic History   Marital status: Widowed    Spouse name: Not on file   Number of children: Not on file   Years of education: Not on file   Highest education level: Not on file  Occupational History   Not on file  Tobacco Use   Smoking status: Former    Current packs/day: 0.50    Types: Cigarettes   Smokeless tobacco: Never   Tobacco comments:    March 11 2016  Vaping Use   Vaping status: Never Used  Substance and Sexual Activity   Alcohol use: Not Currently    Alcohol/week: 4.0 - 6.0 standard drinks of alcohol    Types: 4 - 6 Shots of liquor per week   Drug use: No   Sexual activity:  Not on file  Other Topics Concern   Not on file  Social History Narrative   Single.   2 children.   Works independently as BUILDING SERVICES ENGINEER.   Enjoys reading, watching tv.   Social Drivers of Corporate Investment Banker Strain: Low Risk  (10/18/2023)   Received from [facility]   Overall Financial Resource Strain (CARDIA)    Difficulty of Paying Living Expenses: Not hard at all  Food Insecurity: No Food Insecurity (10/18/2023)   Received from [facility]   Hunger Vital Sign    Within the past 12 months, you worried that your food would run out before you got the money to buy more.: Never true    Within the past 12 months, the food you bought just didn't last and you didn't have money to get more.: Never true  Transportation Needs: No Transportation Needs (10/18/2023)   Received from [facility] - Transportation    In the past 12 months, has lack of transportation kept you from medical appointments or from getting medications?: No    Lack of Transportation (Non-Medical): No  Physical Activity: Not on file  Stress: Not on file  Social Connections: Not on file  Intimate Partner Violence: Not At Risk (07/14/2023)   Humiliation, Afraid, Rape, and Kick questionnaire    Fear of Current or Ex-Partner: No    Emotionally Abused: No    Physically Abused: No    Sexually Abused: No      Modena Callander, M.D. Ph.D.  [facility] [address], [address] [place], [place] [address] Ph: [phone] Fax: [phone]  CC:  Stephanie Charlene CROME, MD [address] [place],  [place] [address]  Hamrick, Charlene CROME, MD

## 2024-03-22 ENCOUNTER — Telehealth: Payer: Self-pay | Admitting: Pharmacist

## 2024-03-22 DIAGNOSIS — G43709 Chronic migraine without aura, not intractable, without status migrainosus: Secondary | ICD-10-CM

## 2024-03-22 DIAGNOSIS — G5712 Meralgia paresthetica, left lower limb: Secondary | ICD-10-CM

## 2024-03-22 NOTE — Telephone Encounter (Signed)
 Pharmacy Patient Advocate Encounter   Received notification from Patient Pharmacy that prior authorization for Nurtec 75MG  dispersible tablets is required/requested.   Insurance verification completed.   The patient is insured through [employer].   Per test claim: PA required; PA submitted to above mentioned insurance via Latent Key/confirmation #/EOC [facility] Status is pending

## 2024-03-22 NOTE — Telephone Encounter (Signed)
 Additional information has been requested from the patient's insurance in order to proceed with the prior authorization request. Requested information has been sent, or form has been filled out and faxed back to [phone]

## 2024-03-23 ENCOUNTER — Other Ambulatory Visit: Payer: Self-pay | Admitting: Neurology

## 2024-03-24 NOTE — Telephone Encounter (Signed)
 Pharmacy Patient Advocate Encounter  Received notification from [facility] that Prior Authorization for Nurtec 75MG  dispersible tablets  has been DENIED.  Full denial letter will be uploaded to the media tab. See denial reason below.   PA #/Case ID/Reference #: [id number]

## 2024-03-27 ENCOUNTER — Telehealth: Payer: Self-pay

## 2024-03-27 ENCOUNTER — Other Ambulatory Visit (HOSPITAL_COMMUNITY): Payer: Self-pay

## 2024-03-27 MED ORDER — NURTEC 75 MG PO TBDP: ORAL_TABLET | ORAL | 6 refills | Status: AC

## 2024-03-27 MED ORDER — AIMOVIG 70 MG/ML ~~LOC~~ SOAJ: 70.0000 mg | SUBCUTANEOUS | 11 refills | Status: AC

## 2024-03-27 NOTE — Telephone Encounter (Signed)
 Hello Prescriber!  We are in the process of submitting a prior authorization for your patient for Aimovig. We are reaching out for clinical guidance for the following information to complete the request: The patient's plan requires documentation showing that patient to has tried and failed, or have contraindication to the following agents before they will approve our request: Please see below-this medication requires step therapy. Or please advise if you would like to switch prescribed medication per insurance requirement.   Thank you! Pharmacy Team

## 2024-03-27 NOTE — Telephone Encounter (Signed)
 Pharmacy Patient Advocate Encounter   Received notification from Physician's Office that prior authorization for Nurtec  is required/requested.   Insurance verification completed.   The patient is insured through [employer].   Per test claim: PA required; PA started via CoverMyMeds. KEY [id number] . Waiting for clinical questions to populate.

## 2024-03-27 NOTE — Telephone Encounter (Signed)
 Meds ordered this encounter  Medications   Erenumab-aooe (AIMOVIG) 70 MG/ML SOAJ--as preventive medications    Sig: Inject 70 mg into the skin every 30 (thirty) days.    Dispense:  1.12 mL    Refill:  11   Rimegepant Sulfate (NURTEC) 75 MG TBDP--for abortive treatment    Sig: as needed for migraine, no more than 75mg  in 24 hours.    Dispense:  10 tablet    Refill:  6     He is not a good candidate for triptan, see office visit

## 2024-03-27 NOTE — Telephone Encounter (Signed)
 Dr. Onita- Nurtec denied as preventative. Covered options: Aimovig, Ajovy, Emgality. I reviewed chart and do not see where he has tried any of these. Did you want to change to one of these options?

## 2024-03-28 NOTE — Telephone Encounter (Signed)
 Clinical questions have been answered and PA submitted. PA currently Pending. Please be advised that most companies allow up to 30 days to make a decision. We will advise when a determination has been made, or follow up in 1 week.   Please reach out to our team, Rx Prior Auth Pool, if you haven't heard back in a week.

## 2024-03-29 NOTE — Telephone Encounter (Signed)
 Chart reviewed, his headache overall has improved, will hold off CGRP antagonist prior authorization for now, hope sleep study and treatment of obstructive sleep apnea will help in

## 2024-03-30 ENCOUNTER — Other Ambulatory Visit (HOSPITAL_COMMUNITY): Payer: Self-pay

## 2024-03-30 NOTE — Telephone Encounter (Signed)
 Pharmacy Patient Advocate Encounter  Received notification from [facility] that Prior Authorization for Nurtec 75MG  dispersible tablets  has been APPROVED from 03/30/2024 to 03/29/2025. Unable to obtain price due to refill too soon rejection, last fill date 03/29/2024 next available fill date12/02/2024   PA #/Case ID/Reference #: [id number]

## 2024-04-05 ENCOUNTER — Telehealth (INDEPENDENT_AMBULATORY_CARE_PROVIDER_SITE_OTHER): Payer: Self-pay | Admitting: Vascular Surgery

## 2024-04-05 ENCOUNTER — Ambulatory Visit: Payer: MEDICAID | Attending: Sleep Medicine

## 2024-04-05 DIAGNOSIS — G4733 Obstructive sleep apnea (adult) (pediatric): Secondary | ICD-10-CM | POA: Diagnosis present

## 2024-04-05 NOTE — Telephone Encounter (Signed)
° °  Bilat GSV Laser see JD. No auth req. 2 units total.   LO. 1 week post ablation Bilat GSV Laser.   4 week post bilat GSV laser see JD/FB.

## 2024-04-06 ENCOUNTER — Telehealth (INDEPENDENT_AMBULATORY_CARE_PROVIDER_SITE_OTHER): Payer: Self-pay | Admitting: Vascular Surgery

## 2024-04-06 NOTE — Telephone Encounter (Signed)
 Pt is scheduled for Bilat GSV Laser see JD. No auth req. 2 units total. On 04/18/24 with Dr. Selinda Gu. Please send pt prescription in to pharmacy on file (verified with pt). Thanks.

## 2024-04-07 ENCOUNTER — Other Ambulatory Visit (INDEPENDENT_AMBULATORY_CARE_PROVIDER_SITE_OTHER): Payer: Self-pay

## 2024-04-07 MED ORDER — ALPRAZOLAM 0.5 MG PO TABS: ORAL_TABLET | ORAL | 0 refills | Status: AC

## 2024-04-07 NOTE — Telephone Encounter (Signed)
 Sent to NP for approval w earliest fill 04/13/24

## 2024-04-13 ENCOUNTER — Telehealth: Payer: Self-pay | Admitting: Cardiology

## 2024-04-13 NOTE — Telephone Encounter (Signed)
 Called to confirm/remind patient of their appointment at the [facility] on 04/14/24.   Appointment:   [] Confirmed  [x] Left mess   [] No answer/No voice mail  [] VM Full/unable to leave message  [] Phone not in service  Patient reminded to bring all medications and/or complete list.  Confirmed patient has transportation. Gave directions, instructed to utilize valet parking.

## 2024-04-14 ENCOUNTER — Ambulatory Visit: Payer: MEDICAID | Attending: Cardiology | Admitting: Family

## 2024-04-14 ENCOUNTER — Telehealth: Payer: Self-pay

## 2024-04-14 ENCOUNTER — Encounter: Payer: Self-pay | Admitting: Cardiology

## 2024-04-14 VITALS — BP 143/71 | HR 81 | Wt 374.0 lb

## 2024-04-14 DIAGNOSIS — Z87891 Personal history of nicotine dependence: Secondary | ICD-10-CM | POA: Insufficient documentation

## 2024-04-14 DIAGNOSIS — J45909 Unspecified asthma, uncomplicated: Secondary | ICD-10-CM | POA: Insufficient documentation

## 2024-04-14 DIAGNOSIS — M545 Low back pain, unspecified: Secondary | ICD-10-CM | POA: Insufficient documentation

## 2024-04-14 DIAGNOSIS — Z6841 Body Mass Index (BMI) 40.0 and over, adult: Secondary | ICD-10-CM | POA: Diagnosis not present

## 2024-04-14 DIAGNOSIS — F1990 Other psychoactive substance use, unspecified, uncomplicated: Secondary | ICD-10-CM | POA: Diagnosis not present

## 2024-04-14 DIAGNOSIS — G4733 Obstructive sleep apnea (adult) (pediatric): Secondary | ICD-10-CM | POA: Diagnosis not present

## 2024-04-14 DIAGNOSIS — I5032 Chronic diastolic (congestive) heart failure: Secondary | ICD-10-CM

## 2024-04-14 DIAGNOSIS — E669 Obesity, unspecified: Secondary | ICD-10-CM | POA: Insufficient documentation

## 2024-04-14 DIAGNOSIS — G8929 Other chronic pain: Secondary | ICD-10-CM | POA: Diagnosis not present

## 2024-04-14 DIAGNOSIS — I429 Cardiomyopathy, unspecified: Secondary | ICD-10-CM | POA: Insufficient documentation

## 2024-04-14 DIAGNOSIS — I8393 Asymptomatic varicose veins of bilateral lower extremities: Secondary | ICD-10-CM | POA: Diagnosis not present

## 2024-04-14 DIAGNOSIS — I272 Pulmonary hypertension, unspecified: Secondary | ICD-10-CM | POA: Diagnosis not present

## 2024-04-14 DIAGNOSIS — I5030 Unspecified diastolic (congestive) heart failure: Secondary | ICD-10-CM | POA: Diagnosis present

## 2024-04-14 MED ORDER — EMPAGLIFLOZIN 10 MG PO TABS: 10.0000 mg | ORAL_TABLET | Freq: Every day | ORAL | 11 refills | Status: AC

## 2024-04-14 NOTE — Patient Instructions (Signed)
 Medication Changes:  START Jardiance  10mg  ( 1 tab) daily before breakfast   Follow-Up in: Please follow up with the [facility] in 1 month with Ellouise Class, FNP.   Thank you for choosing [facility].    At the [facility], you and your health needs are our priority. We have a designated team specialized in the treatment of Heart Failure. This Care Team includes your primary Heart Failure Specialized Cardiologist (physician), Advanced Practice Providers (APPs- Physician Assistants and Nurse Practitioners), and Pharmacist who all work together to provide you with the care you need, when you need it.   You may see any of the following providers on your designated Care Team at your next follow up:  Dr. Toribio Fuel Dr. Ezra Shuck Dr. Ria Commander Dr. Morene Brownie Ellouise Class, FNP Jaun Bash, RPH-CPP  Please be sure to bring in all your medications bottles to every appointment.   Need to Contact Us :  If you have any questions or concerns before your next appointment please send us  a message through [place] or call our office at [phone].    TO LEAVE A MESSAGE FOR THE NURSE SELECT OPTION 2, PLEASE LEAVE A MESSAGE INCLUDING: YOUR NAME DATE OF BIRTH CALL BACK NUMBER REASON FOR CALL**this is important as we prioritize the call backs  YOU WILL RECEIVE A CALL BACK THE SAME DAY AS LONG AS YOU CALL BEFORE 4:00 PM

## 2024-04-14 NOTE — Progress Notes (Signed)
 "  ADVANCED HEART FAILURE NEW PATIENT CLINIC NOTE  Referring Physician: Isadora Hose, MD  Primary Care: Stephanie Charlene CROME, MD Primary Cardiologist: [facility] (doesn't plan to return)  Chief Complaint: fatigue   HPI: [name] is a 58 y.o. male with a PMH of asthma, former smoker X 30+ years, right upper extremity DVT, obesity, untreated OSA, mild pHTN, previous alcohol use, anaphylactic reaction to blood products who presents for initial visit for further evaluation and treatment of heart failure/cardiomyopathy.   Admitted to the hospital 07/13/23 for inguinal hernia repair. Hospital course complicated by bleeding, requiring transfusion and emergent abdominal surgery. Had severe anaphylactic reaction to blood products. Taken to ICU due to shock with hypoxemia. Developed right upper extremity DVT due to PICC line placement. Echo obtained at that time showed normal systolic and diastolic function. Had small bowel evisceration and exp lap. Coded overnight. On pressors, NGT, postop ileus, + aspiration pneumonia, TPN. IV diuresed. Discharged after 23 days.    SUBJECTIVE:  He presents today, with his daughter, for his initial HF visit with a chief complaint of moderate fatigue. Has associated shortness of breath, palpitations, intermittent chest pain, pedal edema. Occasionally wakes up from sleep feeling SOB. Chronic  LBP. Varicose veins in bilateral legs. Had recent sleep study done and will be starting CPAP. Denies cough, dizziness, abdominal distention. Used to smoke 1/2 ppd cigarettes and drank 6-7 drinks/ week until his lengthy admission 03/25. Has not smoked or drank any alcohol since then.   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   PMH, current medications, allergies, social history, and family history reviewed in epic.  ROS: All systems negative except what is listed in HPI, PMH and Problem  List   PHYSICAL EXAM:  General: Well appearing obese male in NAD.  Cor: No JVD. Regular rhythm, rate.  Lungs: clear Abdomen: soft, nontender, nondistended. Extremities: 1+ pitting edema BLE, extremities warm, + varicosity BLE Neuro:. Affect pleasant   DATA REVIEW  ECG: 01/24/24: NSR   ECHO: 07/14/23: EF 65-70%, mild LVH, normal RV 10/26/23: EF 55-60%, normal RV, mild BAE  CATH: RHC 02/01/24 showed mild to moderately elevated PA pressures, mean PA 30 mmHg.    Heart failure review: - Classification: Heart failure with preserved EF - Etiology: Cardiomyopathy due to ? Untreated sleep apnea - NYHA Class: III - Volume status: Euvolemic - ACEi/ARB/ARNI: Not indicated - Aldosterone antagonist: Plan to start at a subsequent visit - Beta-blocker: Not indicated - Digoxin: Not indicated - Hydralazine /Nitrates: Not indicated - SGLT2i: Starting today - GLP-1: Consider in future - Advanced therapies: Not needed at this time - ICD: Not a candidate   ASSESSMENT & PLAN:  1: HFpEF- - etiology likely untreated sleep apnea, previous alcohol use - NYHA class III - euvolemic - weighing daily - Echo 10/26/23: EF 55-60%, normal RV, mild BAE - begin jardiance 10mg  daily. BMET next visit - discussed adding MRA next visit - continue torsemide  20mg  BID/ potassium 10meq daily. If starts urinating a lot with jardiance, can decrease torsemide  to 20mg  daily - drinks ~ 100 ounces of fluid daily but keeps up with intake / output - discussed referring to pulmonary rehab but will defer for today. He will be having laser varicose procedure done next week - BMET from PCP office 03/31/24 showed: potassium 4.1, creatinine 1.08, GFR 81 - had seen [facility] cardiology in the past but says that he has  no intention of returning  2: OSA- - treated for OSA in the past but he stopped using CPAP - split night sleep study completed 04/05/24 for CPAP titration  3: Substance use- - used to smoke 1/2 ppd cigarettes and  drank 6-7 drinks weekly - says that since 03/25 admission, he hasn't smoked or drank anything - continued cessation encouraged  4: mild pHTN- - RHC 02/01/24 showed mild to moderately elevated PA pressures, mean PA 30 mmHg.  - follows with pulmonology  5: Obesity- - BMI 50.72 kg/ m2 - discussed GLP1's and he's not opposed to them - will defer until next visit   Return in 1 month, sooner if needed.   I spent 45 minutes reviewing records, interviewing/ examing patient and managing plan/ orders.    Ellouise DELENA Class FNP-C 04/14/2024 "

## 2024-04-17 ENCOUNTER — Other Ambulatory Visit (INDEPENDENT_AMBULATORY_CARE_PROVIDER_SITE_OTHER): Payer: Self-pay | Admitting: Vascular Surgery

## 2024-04-17 ENCOUNTER — Other Ambulatory Visit (HOSPITAL_COMMUNITY): Payer: Self-pay

## 2024-04-17 DIAGNOSIS — I83819 Varicose veins of unspecified lower extremities with pain: Secondary | ICD-10-CM

## 2024-04-17 NOTE — Telephone Encounter (Signed)
 Advanced Heart Failure Patient Advocate Encounter  Prior authorization for Jardiance  has been submitted and approved. Test billing returns $4 for 90 day supply.  Key: [id number] Effective: 04/13/2024 to 04/13/2025  Rachel DEL, CPhT Rx Patient Advocate Phone: [phone]

## 2024-04-18 ENCOUNTER — Encounter (INDEPENDENT_AMBULATORY_CARE_PROVIDER_SITE_OTHER): Payer: Self-pay | Admitting: Vascular Surgery

## 2024-04-18 ENCOUNTER — Ambulatory Visit (INDEPENDENT_AMBULATORY_CARE_PROVIDER_SITE_OTHER): Payer: MEDICAID | Admitting: Vascular Surgery

## 2024-04-18 VITALS — BP 155/89 | HR 81 | Resp 18 | Wt 371.4 lb

## 2024-04-18 DIAGNOSIS — I872 Venous insufficiency (chronic) (peripheral): Secondary | ICD-10-CM

## 2024-04-18 NOTE — Progress Notes (Signed)
 " [name] is a 58 y.o. male who presents with symptomatic venous reflux  Past Medical History:  Diagnosis Date   Acute deep vein thrombosis (DVT) of right lower extremity after procedure (HCC)    Anaphylactoid reaction due to blood product    Arthritis    Asthma    as a child   Atherosclerosis of aorta    Bronchitis 03/2016   pt has recovered from bronchitis   Cellulitis    CHF (congestive heart failure) [facility])    Oct 2025   Chickenpox    Depression    Frequent headaches    GERD (gastroesophageal reflux disease)    when I was younger, better now.   Hepatic steatosis    Hernia, inguinal    History of alcohol use    History of rectal bleeding    History of thrombocytosis    Hypertension    Hyperthyroidism    Hypokalemia    Hyponatremia    Leg edema    Migraines    history of, not now   Morbid obesity (HCC)    OSA (obstructive sleep apnea)    Perioperative dehiscence of abdominal wound with evisceration, subsequent encounter    Pneumonia    Postoperative ileus (HCC)    SOB (shortness of breath)    Transfusion related acute lung injury (TRALI)    Tremor    Vitamin D deficiency     Past Surgical History:  Procedure Laterality Date   APPENDECTOMY  1977   HERNIA REPAIR     HYDROCELE EXCISION Right 06/01/2016   Procedure: HYDROCELECTOMY ADULT;  Surgeon: Rosina Riis, MD;  Location: [facility] ORS;  Service: Urology;  Laterality: Right;   knee Left 1994   KNEE ARTHROSCOPY Right 1992   LAPAROTOMY N/A 07/13/2023   Procedure: LAPAROTOMY, EXPLORATORY;  Surgeon: Tye Millet, DO;  Location: [facility] ORS;  Service: General;  Laterality: N/A;  Pink pad for case is needed possilble open   LAPAROTOMY N/A 07/17/2023   Procedure: LAPAROTOMY, EXPLORATORY, ABDOMINAL WASHOUT WITH CLOSURE;  Surgeon: Desiderio Schanz, MD;  Location: [facility] ORS;  Service: General;  Laterality: N/A;   RIGHT HEART CATH Right 02/01/2024   Procedure: RIGHT HEART CATH;  Surgeon: Mady Bruckner, MD;  Location: [facility]  INVASIVE CV LAB;  Service: Cardiovascular;  Laterality: Right;   UMBILICAL HERNIA REPAIR  2013   VASECTOMY N/A 06/01/2016   Procedure: VASECTOMY;  Surgeon: Rosina Riis, MD;  Location: [facility] ORS;  Service: Urology;  Laterality: N/A;    Current Medications[1]  Allergies[2]   Venous (peripheral) insufficiency   PLAN: The patient's right lower extremity was sterilely prepped and draped. The ultrasound machine was used to visualize the saphenous vein throughout its course. A segment just below the knee was selected for access. The saphenous vein was accessed without difficulty using ultrasound guidance with a micropuncture needle. A 0.018 wire was then placed beyond the saphenofemoral junction and the needle was removed. The 65 cm sheath was then placed over the wire and the wire and dilator were removed. The laser fiber was then placed through the sheath and its tip was placed approximately 5 centimeters below the saphenofemoral junction. Tumescent anesthesia was then created with a dilute lidocaine  solution. Laser energy was then delivered with constant withdrawal of the sheath and laser fiber. Approximately 1497 joules of energy were delivered over a length of 33 centimeters using a 1470 Hz VenaCure machine at 7 W. Sterile dressings were placed. The patient tolerated the procedure well without obvious complications.  Follow-up in 1 week with post-laser duplex.     [1]  Current Outpatient Medications:    ALPRAZolam  (XANAX ) 0.5 MG tablet, Please take 1st tab one hour prior to your procedure and take the 2nd tab when you arrive at our office for your appointment, Disp: 2 tablet, Rfl: 0   cyanocobalamin (VITAMIN B12) 1000 MCG tablet, Take 1,000 mcg by mouth daily., Disp: , Rfl:    empagliflozin  (JARDIANCE ) 10 MG TABS tablet, Take 1 tablet (10 mg total) by mouth daily before breakfast., Disp: 30 tablet, Rfl: 11   omeprazole (PRILOSEC) 40 MG capsule, Take 40 mg by mouth daily., Disp: , Rfl:     potassium chloride  (MICRO-K ) 10 MEQ CR capsule, Take 1 capsule (10 mEq total) by mouth daily., Disp: 90 capsule, Rfl: 3   Rimegepant Sulfate (NURTEC) 75 MG TBDP, as needed for migraine, no more than 75mg  in 24 hours., Disp: 10 tablet, Rfl: 6   torsemide  (DEMADEX ) 20 MG tablet, Take 1 tablet (20 mg total) by mouth 2 (two) times daily., Disp: 180 tablet, Rfl: 3   umeclidinium-vilanterol (ANORO ELLIPTA ) 62.5-25 MCG/ACT AEPB, Inhale 1 puff into the lungs daily., Disp: 30 each, Rfl: 12   VENTOLIN  HFA 108 (90 Base) MCG/ACT inhaler, Inhale 1 puff into the lungs every 4 (four) hours as needed for wheezing or shortness of breath., Disp: , Rfl:    Erenumab -aooe (AIMOVIG ) 70 MG/ML SOAJ, Inject 70 mg into the skin every 30 (thirty) days. (Patient not taking: Reported on 04/14/2024), Disp: 1.12 mL, Rfl: 11 [2]  Allergies Allergen Reactions   Red Blood Cells Other (See Comments)    Recently had a blood transfusion that resulted in systemic sepsis and organ failure.   Sulfa Antibiotics Anaphylaxis and Hives   Whole Blood Other (See Comments)    Recently had a blood transfusion that resulted in systemic sepsis and organ failure.   "

## 2024-04-18 NOTE — Assessment & Plan Note (Signed)
 [name]

## 2024-04-24 ENCOUNTER — Encounter (INDEPENDENT_AMBULATORY_CARE_PROVIDER_SITE_OTHER): Payer: MEDICAID

## 2024-04-26 ENCOUNTER — Encounter (INDEPENDENT_AMBULATORY_CARE_PROVIDER_SITE_OTHER): Payer: Self-pay

## 2024-04-26 ENCOUNTER — Encounter (INDEPENDENT_AMBULATORY_CARE_PROVIDER_SITE_OTHER): Payer: MEDICAID

## 2024-05-01 ENCOUNTER — Encounter (INDEPENDENT_AMBULATORY_CARE_PROVIDER_SITE_OTHER): Payer: MEDICAID

## 2024-05-01 ENCOUNTER — Encounter (INDEPENDENT_AMBULATORY_CARE_PROVIDER_SITE_OTHER): Payer: Self-pay

## 2024-05-02 ENCOUNTER — Ambulatory Visit: Payer: MEDICAID | Admitting: Neurology

## 2024-05-03 ENCOUNTER — Telehealth: Payer: Self-pay | Admitting: Student in an Organized Health Care Education/Training Program

## 2024-05-03 NOTE — Telephone Encounter (Signed)
 Dr. Isadora we received a message from [facility] the patient had called them asking for his sleep study results

## 2024-05-03 NOTE — Telephone Encounter (Signed)
 Sleep study has been scanned in

## 2024-05-08 ENCOUNTER — Ambulatory Visit: Payer: Self-pay | Admitting: Sleep Medicine

## 2024-05-08 DIAGNOSIS — G4733 Obstructive sleep apnea (adult) (pediatric): Secondary | ICD-10-CM

## 2024-05-11 NOTE — Telephone Encounter (Signed)
-----   Message from Devona JONETTA Skiff, MD sent at 05/08/2024  8:52 AM EST -----  PSG revealed moderate to severe OSA, recommend starting patient on APAP 4-16 cm H2O.

## 2024-05-11 NOTE — Addendum Note (Signed)
 Addended by: VICCI EVALENE DEL on: 05/11/2024 04:46 PM   Modules accepted: Orders

## 2024-05-11 NOTE — Telephone Encounter (Signed)
 I spoke with the patient. He would like to go ahead and get a new CPAP machine. He would like to have the mask he used during the sleep study.  He will call back once he gets his CPAP to schedule an appt with Dr. Jess.  I have placed the order for the CPAP with a Wide Resmed AirFit F30i mask.   Nothing further needed.

## 2024-05-11 NOTE — Telephone Encounter (Signed)
 See result message from 1/12.   Closing this encounter.

## 2024-05-16 ENCOUNTER — Ambulatory Visit (INDEPENDENT_AMBULATORY_CARE_PROVIDER_SITE_OTHER): Payer: MEDICAID | Admitting: Nurse Practitioner

## 2024-05-16 ENCOUNTER — Ambulatory Visit (INDEPENDENT_AMBULATORY_CARE_PROVIDER_SITE_OTHER): Payer: MEDICAID

## 2024-05-16 ENCOUNTER — Encounter (INDEPENDENT_AMBULATORY_CARE_PROVIDER_SITE_OTHER): Payer: Self-pay | Admitting: Nurse Practitioner

## 2024-05-16 VITALS — BP 162/89 | HR 74 | Resp 18 | Wt 370.8 lb

## 2024-05-16 DIAGNOSIS — I10 Essential (primary) hypertension: Secondary | ICD-10-CM

## 2024-05-16 DIAGNOSIS — I872 Venous insufficiency (chronic) (peripheral): Secondary | ICD-10-CM

## 2024-05-16 DIAGNOSIS — I8312 Varicose veins of left lower extremity with inflammation: Secondary | ICD-10-CM

## 2024-05-18 ENCOUNTER — Ambulatory Visit: Payer: MEDICAID | Admitting: Family

## 2024-05-21 ENCOUNTER — Encounter (INDEPENDENT_AMBULATORY_CARE_PROVIDER_SITE_OTHER): Payer: Self-pay | Admitting: Nurse Practitioner

## 2024-05-21 NOTE — Progress Notes (Signed)
 "  Subjective:    Patient ID: [name], male    DOB: 06/29/1965, 59 y.o.   MRN: [id number] Chief Complaint  Patient presents with   Follow-up    4 week post laser    HPI  Discussed the use of AI scribe software for clinical note transcription with the patient, who gave verbal consent to proceed.  History of Present Illness [name] is a 59 year old male with chronic venous insufficiency and heart failure who presents with persistent right shin pain following recent venous ablation.  For the past two weeks, he has experienced a constant dull ache in the right shin, with superimposed episodes of sharp, burning pain occurring four to five times daily. The pain is localized to the shin and, according to the patient, is not noticeably worse with walking or activity. The burning sensation intensifies when the leg is elevated, and local massage provides partial relief.  Symptoms began approximately one week after endovenous ablation of the right leg veins. He was initially asymptomatic post-procedure, with pain developing subsequently. He has not completed his scheduled follow-up ultrasound due to an episode of influenza. He has a prior history of venous thromboembolism last year.  He has attempted compression therapy with an ace bandage and various compression stockings, but has difficulty obtaining appropriate sizes due to large calves and feet. Pain severity correlates with fluid retention, worsening on days when he is unable to take torsemide . He is considering a trial of NSAIDs when able to remain at home. He also reports frequent lower back discomfort, particularly when standing, sometimes requiring him to bend forward for relief. He denies chest pain, dyspnea, or palpitations.    Results Lower extremity venous duplex ultrasound No evidence of deep vein thrombosis. Treated vein patent with residual inflammation. No acute abnormalities detected.   Review of Systems   Cardiovascular:  Positive for leg swelling.  Musculoskeletal:  Positive for arthralgias.  All other systems reviewed and are negative.      Objective:   Physical Exam Vitals reviewed.  HENT:     Head: Normocephalic.  Cardiovascular:     Rate and Rhythm: Normal rate.     Pulses: Normal pulses.  Pulmonary:     Effort: Pulmonary effort is normal.  Musculoskeletal:        General: Tenderness present.  Skin:    General: Skin is warm and dry.  Neurological:     Mental Status: He is alert and oriented to person, place, and time.  Psychiatric:        Mood and Affect: Mood normal.        Behavior: Behavior normal.        Thought Content: Thought content normal.        Judgment: Judgment normal.     Physical Exam EXTREMITIES: Presence of a larger vein in the leg.  BP (!) 162/89 (BP Location: Right Arm)   Pulse 74   Resp 18   Wt (!) 370 lb 12.8 oz (168.2 kg)   BMI 50.29 kg/m   Past Medical History:  Diagnosis Date   Acute deep vein thrombosis (DVT) of right lower extremity after procedure (HCC)    Anaphylactoid reaction due to blood product    Arthritis    Asthma    as a child   Atherosclerosis of aorta    Bronchitis 03/2016   pt has recovered from bronchitis   Cellulitis    CHF (congestive heart failure) [facility])    Oct 2025  Chickenpox    Depression    Frequent headaches    GERD (gastroesophageal reflux disease)    when I was younger, better now.   Hepatic steatosis    Hernia, inguinal    History of alcohol use    History of rectal bleeding    History of thrombocytosis    Hypertension    Hyperthyroidism    Hypokalemia    Hyponatremia    Leg edema    Migraines    history of, not now   Morbid obesity (HCC)    OSA (obstructive sleep apnea)    Perioperative dehiscence of abdominal wound with evisceration, subsequent encounter    Pneumonia    Postoperative ileus (HCC)    SOB (shortness of breath)    Transfusion related acute lung injury (TRALI)    Tremor     Vitamin D deficiency     Social History   Socioeconomic History   Marital status: Widowed    Spouse name: Not on file   Number of children: Not on file   Years of education: Not on file   Highest education level: Not on file  Occupational History   Not on file  Tobacco Use   Smoking status: Former    Current packs/day: 0.50    Types: Cigarettes   Smokeless tobacco: Never   Tobacco comments:    March 11 2016  Vaping Use   Vaping status: Never Used  Substance and Sexual Activity   Alcohol use: Not Currently    Alcohol/week: 4.0 - 6.0 standard drinks of alcohol    Types: 4 - 6 Shots of liquor per week   Drug use: No   Sexual activity: Not on file  Other Topics Concern   Not on file  Social History Narrative   Single.   2 children.   Works independently as BUILDING SERVICES ENGINEER.   Enjoys reading, watching tv.   Social Drivers of Health   Tobacco Use: Medium Risk (05/21/2024)   Patient History    Smoking Tobacco Use: Former    Smokeless Tobacco Use: Never    Passive Exposure: Not on file  Financial Resource Strain: Low Risk  (10/18/2023)   Received from [facility]   Overall Financial Resource Strain (CARDIA)    Difficulty of Paying Living Expenses: Not hard at all  Food Insecurity: No Food Insecurity (10/18/2023)   Received from [facility]   Epic    Within the past 12 months, you worried that your food would run out before you got the money to buy more.: Never true    Within the past 12 months, the food you bought just didn't last and you didn't have money to get more.: Never true  Transportation Needs: No Transportation Needs (10/18/2023)   Received from [facility] - Transportation    In the past 12 months, has lack of transportation kept you from medical appointments or from getting medications?: No    Lack of Transportation (Non-Medical): No  Physical Activity: Not on file  Stress: Not on file  Social  Connections: Not on file  Intimate Partner Violence: Not At Risk (07/14/2023)   Humiliation, Afraid, Rape, and Kick questionnaire    Fear of Current or Ex-Partner: No    Emotionally Abused: No    Physically Abused: No    Sexually Abused: No  Depression (PHQ2-9): Not on file  Alcohol Screen: Not on file  Housing: Low Risk  (10/18/2023)   Received from  [facility]   Epic    In the last 12 months, was there a time when you were not able to pay the mortgage or rent on time?: No    In the past 12 months, how many times have you moved where you were living?: 0    At any time in the past 12 months, were you homeless or living in a shelter (including now)?: No  Recent Concern: Housing - High Risk (09/16/2023)   Received from [facility]   Epic    In the last 12 months, was there a time when you were not able to pay the mortgage or rent on time?: Yes    Number of Times Moved in the Last Year: Not on file    At any time in the past 12 months, were you homeless or living in a shelter (including now)?: No  Utilities: Not At Risk (10/18/2023)   Received from [facility]   Epic    In the past 12 months has the electric, gas, oil, or water company threatened to shut off services in your home?: No  Health Literacy: Not on file    Past Surgical History:  Procedure Laterality Date   APPENDECTOMY  1977   HERNIA REPAIR     HYDROCELE EXCISION Right 06/01/2016   Procedure: HYDROCELECTOMY ADULT;  Surgeon: Rosina Riis, MD;  Location: [facility] ORS;  Service: Urology;  Laterality: Right;   knee Left 1994   KNEE ARTHROSCOPY Right 1992   LAPAROTOMY N/A 07/13/2023   Procedure: LAPAROTOMY, EXPLORATORY;  Surgeon: Tye Millet, DO;  Location: [facility] ORS;  Service: General;  Laterality: N/A;  Pink pad for case is needed possilble open   LAPAROTOMY N/A 07/17/2023   Procedure: LAPAROTOMY, EXPLORATORY, ABDOMINAL WASHOUT WITH CLOSURE;  Surgeon: Desiderio Schanz, MD;   Location: [facility] ORS;  Service: General;  Laterality: N/A;   RIGHT HEART CATH Right 02/01/2024   Procedure: RIGHT HEART CATH;  Surgeon: Mady Bruckner, MD;  Location: [facility] INVASIVE CV LAB;  Service: Cardiovascular;  Laterality: Right;   UMBILICAL HERNIA REPAIR  2013   VASECTOMY N/A 06/01/2016   Procedure: VASECTOMY;  Surgeon: Rosina Riis, MD;  Location: [facility] ORS;  Service: Urology;  Laterality: N/A;    Family History  Problem Relation Age of Onset   Ovarian cancer Mother    Alcohol abuse Father    Hyperlipidemia Father    Hypertension Father    Prostate cancer Maternal Uncle    Arthritis Maternal Grandmother    Colon cancer Maternal Grandmother    Ovarian cancer Maternal Grandmother    Breast cancer Maternal Grandmother    Hyperlipidemia Maternal Grandfather    Hypertension Maternal Grandfather    Hyperlipidemia Paternal Grandmother    Heart attack Paternal Grandmother    Hyperlipidemia Paternal Grandfather    Hypertension Paternal Grandfather    Bladder Cancer Neg Hx    Kidney disease Neg Hx     Allergies[1]     Latest Ref Rng & Units 02/01/2024    9:27 AM 02/01/2024    9:23 AM 01/24/2024   12:24 AM  CBC  WBC 4.0 - 10.5 K/uL   10.6   Hemoglobin 13.0 - 17.0 g/dL 85.6  85.6  85.3   Hematocrit 39.0 - 52.0 % 42.0  42.0  41.6   Platelets 150 - 400 K/uL   158       CMP     Component Value Date/Time   NA 141 02/01/2024 0927  NA 143 01/21/2024 1152   NA 140 12/20/2013 1121   K 4.0 02/01/2024 0927   K 4.2 12/20/2013 1121   CL 109 01/24/2024 0024   CL 108 (H) 12/20/2013 1121   CO2 21 (L) 01/24/2024 0024   CO2 25 12/20/2013 1121   GLUCOSE 95 01/24/2024 0024   GLUCOSE 97 12/20/2013 1121   BUN 8 01/24/2024 0024   BUN 7 01/21/2024 1152   BUN 11 12/20/2013 1121   CREATININE 0.77 01/24/2024 0024   CREATININE 1.29 12/20/2013 1121   CALCIUM 8.4 (L) 01/24/2024 0024   CALCIUM 8.7 12/20/2013 1121   PROT 6.0 (L) 07/26/2023 0558   PROT 7.3 12/20/2013 1121   ALBUMIN  1.7 (L)  07/26/2023 0558   ALBUMIN  3.3 (L) 12/20/2013 1121   AST 73 (H) 07/26/2023 0558   AST 35 12/20/2013 1121   ALT 33 07/26/2023 0558   ALT 31 12/20/2013 1121   ALKPHOS 75 07/26/2023 0558   ALKPHOS 73 12/20/2013 1121   BILITOT 2.1 (H) 07/26/2023 0558   BILITOT 0.7 12/20/2013 1121   GFR 72.06 04/08/2016 0814   EGFR 101 01/21/2024 1152   GFRNONAA >60 01/24/2024 0024   GFRNONAA >60 12/20/2013 1121     No results found.     Assessment & Plan:   1. Varicose veins of left lower extremity with inflammation (Primary) Recommend  I have reviewed my previous  discussion with the patient regarding  varicose veins and why they cause symptoms. Patient will continue  wearing graduated compression stockings class 1 on a daily basis, beginning first thing in the morning and removing them in the evening.  The patient is CEAP [id number].  The patient has been wearing compression for more than 12 weeks with no or little benefit.  The patient has been exercising daily for more than 12 weeks. The patient has been elevating and taking OTC pain medications for more than 12 weeks.  None of these have have eliminated the pain related to the varicose veins and venous reflux or the discomfort regarding venous congestion.    In addition, behavioral modification including elevation during the day was again discussed and this will continue.  The patient has utilized over the counter pain medications and has been exercising.  However, at this time conservative therapy has not alleviated the patient's symptoms of leg pain and swelling  Recommend: laser ablation of the left great saphenous veins to eliminate the symptoms of pain and swelling of the lower extremities caused by the severe superficial venous reflux disease.   2. Venous (peripheral) insufficiency Chronic venous insufficiency of lower extremity Chronic venous insufficiency of the right lower extremity post-endovenous ablation with persistent pain.  Differential includes residual vein inflammation, musculoskeletal, or neuropathic pain. No acute DVT on ultrasound. Conservative management preferred. - Recommended anti-inflammatory (Motrin) and optimize diuretic (torsemide ) for one week. - Scheduled venous ablation for left leg. - Advised retry of compression therapy; measure calf for sizing, ACE bandage if needed. - Discussed sclerotherapy as future option if symptoms persist; deferred immediate intervention. - Advised to contact office if symptoms worsen. - Instructed to avoid soaking leg for three days post-sclerotherapy. - Deferred routine follow-up; instructed to call if symptoms change.  3. Primary hypertension Continue antihypertensive medications as already ordered, these medications have been reviewed and there are no changes at this time.   Assessment and Plan Assessment & Plan      Medications Ordered Prior to Encounter[2]  There are no Patient Instructions on file for this visit. Return  for Apply for L GSV laser .   Dhana Totton E Smith Potenza, NP       [1]  Allergies Allergen Reactions   Red Blood Cells Other (See Comments)    Recently had a blood transfusion that resulted in systemic sepsis and organ failure.   Sulfa Antibiotics Anaphylaxis and Hives   Whole Blood Other (See Comments)    Recently had a blood transfusion that resulted in systemic sepsis and organ failure.  [2]  Current Outpatient Medications on File Prior to Visit  Medication Sig Dispense Refill   ALPRAZolam  (XANAX ) 0.5 MG tablet Please take 1st tab one hour prior to your procedure and take the 2nd tab when you arrive at our office for your appointment 2 tablet 0   cyanocobalamin (VITAMIN B12) 1000 MCG tablet Take 1,000 mcg by mouth daily.     empagliflozin  (JARDIANCE ) 10 MG TABS tablet Take 1 tablet (10 mg total) by mouth daily before breakfast. 30 tablet 11   Erenumab -aooe (AIMOVIG ) 70 MG/ML SOAJ Inject 70 mg into the skin every 30 (thirty) days. 1.12  mL 11   omeprazole (PRILOSEC) 40 MG capsule Take 40 mg by mouth daily.     potassium chloride  (MICRO-K ) 10 MEQ CR capsule Take 1 capsule (10 mEq total) by mouth daily. 90 capsule 3   Rimegepant Sulfate (NURTEC) 75 MG TBDP as needed for migraine, no more than 75mg  in 24 hours. 10 tablet 6   torsemide  (DEMADEX ) 20 MG tablet Take 1 tablet (20 mg total) by mouth 2 (two) times daily. 180 tablet 3   umeclidinium-vilanterol (ANORO ELLIPTA ) 62.5-25 MCG/ACT AEPB Inhale 1 puff into the lungs daily. 30 each 12   VENTOLIN  HFA 108 (90 Base) MCG/ACT inhaler Inhale 1 puff into the lungs every 4 (four) hours as needed for wheezing or shortness of breath.     No current facility-administered medications on file prior to visit.   "

## 2024-05-31 ENCOUNTER — Ambulatory Visit: Payer: MEDICAID | Admitting: Student in an Organized Health Care Education/Training Program

## 2024-06-08 ENCOUNTER — Ambulatory Visit: Payer: MEDICAID | Admitting: Family

## 2024-06-14 ENCOUNTER — Ambulatory Visit: Payer: MEDICAID | Admitting: Student in an Organized Health Care Education/Training Program
